# Patient Record
Sex: Female | Born: 1955
Health system: Southern US, Community
[De-identification: ages and names within clinical notes are randomized; demographics above are authoritative.]

## PROBLEM LIST (undated history)

## (undated) DIAGNOSIS — F419 Anxiety disorder, unspecified: Secondary | ICD-10-CM

## (undated) DIAGNOSIS — M1990 Unspecified osteoarthritis, unspecified site: Secondary | ICD-10-CM

## (undated) DIAGNOSIS — M21371 Foot drop, right foot: Secondary | ICD-10-CM

## (undated) DIAGNOSIS — K5909 Other constipation: Secondary | ICD-10-CM

## (undated) DIAGNOSIS — E785 Hyperlipidemia, unspecified: Secondary | ICD-10-CM

## (undated) DIAGNOSIS — R3911 Hesitancy of micturition: Secondary | ICD-10-CM

## (undated) DIAGNOSIS — E119 Type 2 diabetes mellitus without complications: Secondary | ICD-10-CM

## (undated) DIAGNOSIS — K589 Irritable bowel syndrome without diarrhea: Secondary | ICD-10-CM

## (undated) DIAGNOSIS — G4489 Other headache syndrome: Secondary | ICD-10-CM

## (undated) DIAGNOSIS — Z8711 Personal history of peptic ulcer disease: Secondary | ICD-10-CM

## (undated) DIAGNOSIS — Z9889 Other specified postprocedural states: Secondary | ICD-10-CM

## (undated) DIAGNOSIS — R3989 Other symptoms and signs involving the genitourinary system: Secondary | ICD-10-CM

## (undated) DIAGNOSIS — Z8709 Personal history of other diseases of the respiratory system: Secondary | ICD-10-CM

## (undated) DIAGNOSIS — J302 Other seasonal allergic rhinitis: Secondary | ICD-10-CM

## (undated) DIAGNOSIS — R7303 Prediabetes: Secondary | ICD-10-CM

## (undated) DIAGNOSIS — J45901 Unspecified asthma with (acute) exacerbation: Secondary | ICD-10-CM

## (undated) DIAGNOSIS — Z87440 Personal history of urinary (tract) infections: Secondary | ICD-10-CM

## (undated) DIAGNOSIS — I671 Cerebral aneurysm, nonruptured: Secondary | ICD-10-CM

## (undated) DIAGNOSIS — Z8679 Personal history of other diseases of the circulatory system: Secondary | ICD-10-CM

## (undated) DIAGNOSIS — N3010 Interstitial cystitis (chronic) without hematuria: Secondary | ICD-10-CM

## (undated) DIAGNOSIS — Z973 Presence of spectacles and contact lenses: Secondary | ICD-10-CM

## (undated) DIAGNOSIS — R339 Retention of urine, unspecified: Secondary | ICD-10-CM

## (undated) DIAGNOSIS — Z8719 Personal history of other diseases of the digestive system: Secondary | ICD-10-CM

## (undated) DIAGNOSIS — K219 Gastro-esophageal reflux disease without esophagitis: Secondary | ICD-10-CM

## (undated) DIAGNOSIS — Z972 Presence of dental prosthetic device (complete) (partial): Secondary | ICD-10-CM

## (undated) HISTORY — PX: TONSILLECTOMY: SUR1361

## (undated) HISTORY — PX: TOTAL HIP ARTHROPLASTY: SHX124

## (undated) HISTORY — PX: OTHER SURGICAL HISTORY: SHX169

## (undated) HISTORY — PX: ANEURYSM COILING: SHX5349

---

## 1995-07-02 HISTORY — PX: ABDOMINAL HYSTERECTOMY: SHX81

## 1997-12-09 ENCOUNTER — Inpatient Hospital Stay (HOSPITAL_COMMUNITY): Admission: RE | Admit: 1997-12-09 | Discharge: 1997-12-11 | Payer: Self-pay | Admitting: *Deleted

## 1997-12-19 ENCOUNTER — Ambulatory Visit (HOSPITAL_COMMUNITY): Admission: RE | Admit: 1997-12-19 | Discharge: 1997-12-19 | Payer: Self-pay | Admitting: *Deleted

## 1998-06-07 ENCOUNTER — Emergency Department (HOSPITAL_COMMUNITY): Admission: EM | Admit: 1998-06-07 | Discharge: 1998-06-07 | Payer: Self-pay | Admitting: Emergency Medicine

## 1998-07-01 HISTORY — PX: OTHER SURGICAL HISTORY: SHX169

## 1998-08-08 ENCOUNTER — Emergency Department (HOSPITAL_COMMUNITY): Admission: EM | Admit: 1998-08-08 | Discharge: 1998-08-08 | Payer: Self-pay

## 1998-08-17 ENCOUNTER — Emergency Department (HOSPITAL_COMMUNITY): Admission: EM | Admit: 1998-08-17 | Discharge: 1998-08-17 | Payer: Self-pay | Admitting: Internal Medicine

## 1998-08-23 ENCOUNTER — Emergency Department (HOSPITAL_COMMUNITY): Admission: EM | Admit: 1998-08-23 | Discharge: 1998-08-23 | Payer: Self-pay | Admitting: Emergency Medicine

## 1999-11-08 ENCOUNTER — Ambulatory Visit (HOSPITAL_COMMUNITY): Admission: RE | Admit: 1999-11-08 | Discharge: 1999-11-08 | Payer: Self-pay | Admitting: Neurosurgery

## 1999-11-08 ENCOUNTER — Encounter: Payer: Self-pay | Admitting: Neurosurgery

## 1999-11-22 ENCOUNTER — Ambulatory Visit (HOSPITAL_COMMUNITY): Admission: RE | Admit: 1999-11-22 | Discharge: 1999-11-22 | Payer: Self-pay | Admitting: Neurosurgery

## 1999-11-22 ENCOUNTER — Encounter: Payer: Self-pay | Admitting: Neurosurgery

## 1999-12-06 ENCOUNTER — Ambulatory Visit (HOSPITAL_COMMUNITY): Admission: RE | Admit: 1999-12-06 | Discharge: 1999-12-06 | Payer: Self-pay | Admitting: Neurosurgery

## 1999-12-06 ENCOUNTER — Encounter: Payer: Self-pay | Admitting: Neurosurgery

## 2000-07-01 HISTORY — PX: ANTERIOR CERVICAL DECOMP/DISCECTOMY FUSION: SHX1161

## 2000-11-27 ENCOUNTER — Encounter: Payer: Self-pay | Admitting: Neurosurgery

## 2000-11-27 ENCOUNTER — Inpatient Hospital Stay (HOSPITAL_COMMUNITY): Admission: RE | Admit: 2000-11-27 | Discharge: 2000-11-28 | Payer: Self-pay | Admitting: Neurosurgery

## 2001-01-12 ENCOUNTER — Encounter: Payer: Self-pay | Admitting: Neurosurgery

## 2001-01-12 ENCOUNTER — Encounter: Admission: RE | Admit: 2001-01-12 | Discharge: 2001-01-12 | Payer: Self-pay | Admitting: Neurosurgery

## 2001-02-10 ENCOUNTER — Encounter: Admission: RE | Admit: 2001-02-10 | Discharge: 2001-05-11 | Payer: Self-pay | Admitting: Neurosurgery

## 2001-02-17 ENCOUNTER — Encounter: Admission: RE | Admit: 2001-02-17 | Discharge: 2001-02-17 | Payer: Self-pay | Admitting: Neurosurgery

## 2001-02-17 ENCOUNTER — Encounter: Payer: Self-pay | Admitting: Neurosurgery

## 2001-03-01 ENCOUNTER — Emergency Department (HOSPITAL_COMMUNITY): Admission: EM | Admit: 2001-03-01 | Discharge: 2001-03-01 | Payer: Self-pay | Admitting: Emergency Medicine

## 2001-03-01 ENCOUNTER — Encounter: Payer: Self-pay | Admitting: Emergency Medicine

## 2001-03-03 ENCOUNTER — Emergency Department (HOSPITAL_COMMUNITY): Admission: EM | Admit: 2001-03-03 | Discharge: 2001-03-03 | Payer: Self-pay | Admitting: Emergency Medicine

## 2001-05-15 ENCOUNTER — Emergency Department (HOSPITAL_COMMUNITY): Admission: EM | Admit: 2001-05-15 | Discharge: 2001-05-15 | Payer: Self-pay | Admitting: *Deleted

## 2001-07-08 ENCOUNTER — Encounter: Payer: Self-pay | Admitting: Unknown Physician Specialty

## 2001-07-08 ENCOUNTER — Ambulatory Visit (HOSPITAL_COMMUNITY): Admission: RE | Admit: 2001-07-08 | Discharge: 2001-07-08 | Payer: Self-pay | Admitting: Unknown Physician Specialty

## 2001-08-03 ENCOUNTER — Encounter: Payer: Self-pay | Admitting: Emergency Medicine

## 2001-08-03 ENCOUNTER — Emergency Department (HOSPITAL_COMMUNITY): Admission: EM | Admit: 2001-08-03 | Discharge: 2001-08-03 | Payer: Self-pay | Admitting: Emergency Medicine

## 2002-06-25 ENCOUNTER — Encounter (HOSPITAL_COMMUNITY): Admission: RE | Admit: 2002-06-25 | Discharge: 2002-07-25 | Payer: Self-pay | Admitting: Preventative Medicine

## 2002-07-01 HISTORY — PX: POSTERIOR FUSION CERVICAL SPINE: SUR628

## 2002-07-07 ENCOUNTER — Ambulatory Visit (HOSPITAL_COMMUNITY): Admission: RE | Admit: 2002-07-07 | Discharge: 2002-07-07 | Payer: Self-pay | Admitting: Preventative Medicine

## 2002-07-07 ENCOUNTER — Encounter: Payer: Self-pay | Admitting: Preventative Medicine

## 2002-08-17 ENCOUNTER — Emergency Department (HOSPITAL_COMMUNITY): Admission: EM | Admit: 2002-08-17 | Discharge: 2002-08-17 | Payer: Self-pay | Admitting: *Deleted

## 2002-11-02 ENCOUNTER — Emergency Department (HOSPITAL_COMMUNITY): Admission: EM | Admit: 2002-11-02 | Discharge: 2002-11-02 | Payer: Self-pay | Admitting: Emergency Medicine

## 2002-11-02 ENCOUNTER — Ambulatory Visit (HOSPITAL_COMMUNITY): Admission: RE | Admit: 2002-11-02 | Discharge: 2002-11-02 | Payer: Self-pay | Admitting: Neurosurgery

## 2002-11-02 ENCOUNTER — Encounter: Payer: Self-pay | Admitting: Neurosurgery

## 2003-01-12 ENCOUNTER — Encounter: Payer: Self-pay | Admitting: Family Medicine

## 2003-01-12 ENCOUNTER — Ambulatory Visit (HOSPITAL_COMMUNITY): Admission: RE | Admit: 2003-01-12 | Discharge: 2003-01-12 | Payer: Self-pay | Admitting: Family Medicine

## 2003-02-09 ENCOUNTER — Encounter: Payer: Self-pay | Admitting: Neurosurgery

## 2003-02-09 ENCOUNTER — Encounter (HOSPITAL_COMMUNITY): Admission: RE | Admit: 2003-02-09 | Discharge: 2003-03-11 | Payer: Self-pay | Admitting: Neurosurgery

## 2003-07-27 ENCOUNTER — Ambulatory Visit (HOSPITAL_COMMUNITY): Admission: RE | Admit: 2003-07-27 | Discharge: 2003-07-27 | Payer: Self-pay | Admitting: Internal Medicine

## 2004-03-13 ENCOUNTER — Ambulatory Visit: Payer: Self-pay | Admitting: Family Medicine

## 2004-04-16 ENCOUNTER — Ambulatory Visit: Payer: Self-pay | Admitting: Family Medicine

## 2004-04-17 ENCOUNTER — Ambulatory Visit: Payer: Self-pay | Admitting: *Deleted

## 2004-04-17 ENCOUNTER — Ambulatory Visit: Payer: Self-pay | Admitting: Family Medicine

## 2004-04-30 ENCOUNTER — Ambulatory Visit: Payer: Self-pay | Admitting: Family Medicine

## 2004-06-01 ENCOUNTER — Ambulatory Visit (HOSPITAL_COMMUNITY): Admission: RE | Admit: 2004-06-01 | Discharge: 2004-06-01 | Payer: Self-pay | Admitting: Internal Medicine

## 2004-07-26 ENCOUNTER — Ambulatory Visit: Payer: Self-pay | Admitting: Family Medicine

## 2004-08-01 ENCOUNTER — Ambulatory Visit: Payer: Self-pay | Admitting: Family Medicine

## 2004-08-29 ENCOUNTER — Ambulatory Visit: Payer: Self-pay | Admitting: Family Medicine

## 2004-08-30 ENCOUNTER — Ambulatory Visit: Payer: Self-pay | Admitting: Family Medicine

## 2004-09-18 ENCOUNTER — Ambulatory Visit: Payer: Self-pay | Admitting: Family Medicine

## 2004-10-01 ENCOUNTER — Ambulatory Visit: Payer: Self-pay | Admitting: Internal Medicine

## 2004-10-01 ENCOUNTER — Ambulatory Visit: Payer: Self-pay | Admitting: Family Medicine

## 2004-10-02 ENCOUNTER — Ambulatory Visit: Payer: Self-pay | Admitting: Family Medicine

## 2004-10-17 ENCOUNTER — Emergency Department (HOSPITAL_COMMUNITY): Admission: EM | Admit: 2004-10-17 | Discharge: 2004-10-17 | Payer: Self-pay | Admitting: Emergency Medicine

## 2004-12-05 ENCOUNTER — Ambulatory Visit: Payer: Self-pay | Admitting: Family Medicine

## 2004-12-06 ENCOUNTER — Ambulatory Visit: Payer: Self-pay | Admitting: *Deleted

## 2004-12-12 ENCOUNTER — Ambulatory Visit (HOSPITAL_COMMUNITY): Admission: RE | Admit: 2004-12-12 | Discharge: 2004-12-12 | Payer: Self-pay | Admitting: Family Medicine

## 2004-12-31 ENCOUNTER — Ambulatory Visit: Payer: Self-pay | Admitting: Internal Medicine

## 2005-02-11 ENCOUNTER — Ambulatory Visit: Payer: Self-pay | Admitting: Family Medicine

## 2005-04-01 ENCOUNTER — Encounter (INDEPENDENT_AMBULATORY_CARE_PROVIDER_SITE_OTHER): Payer: Self-pay | Admitting: Family Medicine

## 2005-04-18 ENCOUNTER — Ambulatory Visit: Payer: Self-pay | Admitting: Family Medicine

## 2005-04-29 ENCOUNTER — Ambulatory Visit (HOSPITAL_COMMUNITY): Admission: RE | Admit: 2005-04-29 | Discharge: 2005-04-29 | Payer: Self-pay | Admitting: Internal Medicine

## 2005-06-12 ENCOUNTER — Ambulatory Visit: Payer: Self-pay | Admitting: Family Medicine

## 2005-07-30 ENCOUNTER — Ambulatory Visit: Payer: Self-pay | Admitting: Internal Medicine

## 2005-09-13 ENCOUNTER — Ambulatory Visit: Payer: Self-pay | Admitting: Internal Medicine

## 2005-09-19 ENCOUNTER — Ambulatory Visit (HOSPITAL_COMMUNITY): Admission: RE | Admit: 2005-09-19 | Discharge: 2005-09-19 | Payer: Self-pay | Admitting: Internal Medicine

## 2005-10-09 ENCOUNTER — Ambulatory Visit: Payer: Self-pay | Admitting: Family Medicine

## 2005-11-06 ENCOUNTER — Ambulatory Visit: Payer: Self-pay | Admitting: Family Medicine

## 2005-12-13 ENCOUNTER — Ambulatory Visit: Payer: Self-pay | Admitting: *Deleted

## 2005-12-17 ENCOUNTER — Ambulatory Visit: Payer: Self-pay | Admitting: Family Medicine

## 2005-12-25 ENCOUNTER — Ambulatory Visit (HOSPITAL_COMMUNITY): Admission: RE | Admit: 2005-12-25 | Discharge: 2005-12-25 | Payer: Self-pay | Admitting: Family Medicine

## 2006-01-16 ENCOUNTER — Ambulatory Visit: Payer: Self-pay | Admitting: Family Medicine

## 2006-01-16 ENCOUNTER — Ambulatory Visit (HOSPITAL_COMMUNITY): Admission: RE | Admit: 2006-01-16 | Discharge: 2006-01-16 | Payer: Self-pay | Admitting: Family Medicine

## 2006-02-26 ENCOUNTER — Ambulatory Visit: Payer: Self-pay | Admitting: Family Medicine

## 2006-03-10 ENCOUNTER — Emergency Department (HOSPITAL_COMMUNITY): Admission: EM | Admit: 2006-03-10 | Discharge: 2006-03-10 | Payer: Self-pay | Admitting: Family Medicine

## 2006-03-14 ENCOUNTER — Ambulatory Visit: Payer: Self-pay | Admitting: Family Medicine

## 2006-04-16 ENCOUNTER — Emergency Department (HOSPITAL_COMMUNITY): Admission: EM | Admit: 2006-04-16 | Discharge: 2006-04-16 | Payer: Self-pay | Admitting: Family Medicine

## 2006-04-29 ENCOUNTER — Ambulatory Visit: Payer: Self-pay | Admitting: Family Medicine

## 2006-06-04 ENCOUNTER — Ambulatory Visit (HOSPITAL_COMMUNITY): Admission: RE | Admit: 2006-06-04 | Discharge: 2006-06-04 | Payer: Self-pay | Admitting: Urology

## 2006-06-06 ENCOUNTER — Ambulatory Visit: Payer: Self-pay | Admitting: Family Medicine

## 2006-07-15 ENCOUNTER — Ambulatory Visit (HOSPITAL_BASED_OUTPATIENT_CLINIC_OR_DEPARTMENT_OTHER): Admission: RE | Admit: 2006-07-15 | Discharge: 2006-07-15 | Payer: Self-pay | Admitting: Urology

## 2006-07-15 HISTORY — PX: OTHER SURGICAL HISTORY: SHX169

## 2006-08-19 ENCOUNTER — Ambulatory Visit: Payer: Self-pay | Admitting: Family Medicine

## 2006-08-27 ENCOUNTER — Ambulatory Visit: Payer: Self-pay | Admitting: Family Medicine

## 2006-09-01 ENCOUNTER — Ambulatory Visit (HOSPITAL_COMMUNITY): Admission: RE | Admit: 2006-09-01 | Discharge: 2006-09-01 | Payer: Self-pay | Admitting: Internal Medicine

## 2006-09-10 ENCOUNTER — Ambulatory Visit: Payer: Self-pay | Admitting: Family Medicine

## 2006-10-07 ENCOUNTER — Emergency Department (HOSPITAL_COMMUNITY): Admission: EM | Admit: 2006-10-07 | Discharge: 2006-10-07 | Payer: Self-pay | Admitting: Family Medicine

## 2006-10-27 ENCOUNTER — Ambulatory Visit: Payer: Self-pay | Admitting: Family Medicine

## 2006-12-15 ENCOUNTER — Encounter (INDEPENDENT_AMBULATORY_CARE_PROVIDER_SITE_OTHER): Payer: Self-pay | Admitting: Family Medicine

## 2006-12-15 DIAGNOSIS — N6029 Fibroadenosis of unspecified breast: Secondary | ICD-10-CM | POA: Insufficient documentation

## 2006-12-15 DIAGNOSIS — N3010 Interstitial cystitis (chronic) without hematuria: Secondary | ICD-10-CM | POA: Insufficient documentation

## 2006-12-15 DIAGNOSIS — N951 Menopausal and female climacteric states: Secondary | ICD-10-CM | POA: Insufficient documentation

## 2006-12-15 DIAGNOSIS — M545 Low back pain, unspecified: Secondary | ICD-10-CM | POA: Insufficient documentation

## 2006-12-15 DIAGNOSIS — N809 Endometriosis, unspecified: Secondary | ICD-10-CM | POA: Insufficient documentation

## 2006-12-15 DIAGNOSIS — J309 Allergic rhinitis, unspecified: Secondary | ICD-10-CM | POA: Insufficient documentation

## 2006-12-15 DIAGNOSIS — Z8719 Personal history of other diseases of the digestive system: Secondary | ICD-10-CM | POA: Insufficient documentation

## 2006-12-15 DIAGNOSIS — K219 Gastro-esophageal reflux disease without esophagitis: Secondary | ICD-10-CM | POA: Insufficient documentation

## 2006-12-15 DIAGNOSIS — Z9889 Other specified postprocedural states: Secondary | ICD-10-CM | POA: Insufficient documentation

## 2006-12-15 DIAGNOSIS — Z9079 Acquired absence of other genital organ(s): Secondary | ICD-10-CM | POA: Insufficient documentation

## 2006-12-15 DIAGNOSIS — F1920 Other psychoactive substance dependence, uncomplicated: Secondary | ICD-10-CM | POA: Insufficient documentation

## 2006-12-15 DIAGNOSIS — J45909 Unspecified asthma, uncomplicated: Secondary | ICD-10-CM | POA: Insufficient documentation

## 2006-12-15 DIAGNOSIS — E785 Hyperlipidemia, unspecified: Secondary | ICD-10-CM | POA: Insufficient documentation

## 2006-12-29 ENCOUNTER — Ambulatory Visit (HOSPITAL_COMMUNITY): Admission: RE | Admit: 2006-12-29 | Discharge: 2006-12-29 | Payer: Self-pay | Admitting: Family Medicine

## 2007-01-27 ENCOUNTER — Ambulatory Visit (HOSPITAL_COMMUNITY): Admission: RE | Admit: 2007-01-27 | Discharge: 2007-01-27 | Payer: Self-pay | Admitting: Anesthesiology

## 2007-01-29 ENCOUNTER — Ambulatory Visit (HOSPITAL_COMMUNITY): Admission: RE | Admit: 2007-01-29 | Discharge: 2007-01-29 | Payer: Self-pay | Admitting: Anesthesiology

## 2007-03-12 ENCOUNTER — Ambulatory Visit: Payer: Self-pay | Admitting: Internal Medicine

## 2007-03-12 LAB — CONVERTED CEMR LAB
ALT: 12 units/L (ref 0–35)
Basophils Absolute: 0.1 10*3/uL (ref 0.0–0.1)
Basophils Relative: 1 % (ref 0–1)
CO2: 26 meq/L (ref 19–32)
Creatinine, Ser: 0.71 mg/dL (ref 0.40–1.20)
Eosinophils Relative: 2 % (ref 0–5)
HCT: 38.4 % (ref 36.0–46.0)
Hemoglobin: 12.3 g/dL (ref 12.0–15.0)
Lymphocytes Relative: 63 % — ABNORMAL HIGH (ref 12–46)
MCHC: 32 g/dL (ref 30.0–36.0)
Monocytes Absolute: 0.4 10*3/uL (ref 0.2–0.7)
RDW: 14.8 % — ABNORMAL HIGH (ref 11.5–14.0)
Total Bilirubin: 0.2 mg/dL — ABNORMAL LOW (ref 0.3–1.2)

## 2007-03-18 ENCOUNTER — Encounter (INDEPENDENT_AMBULATORY_CARE_PROVIDER_SITE_OTHER): Payer: Self-pay | Admitting: *Deleted

## 2007-04-08 ENCOUNTER — Ambulatory Visit: Payer: Self-pay | Admitting: Internal Medicine

## 2007-05-31 ENCOUNTER — Emergency Department (HOSPITAL_COMMUNITY): Admission: EM | Admit: 2007-05-31 | Discharge: 2007-05-31 | Payer: Self-pay | Admitting: Emergency Medicine

## 2007-06-09 ENCOUNTER — Ambulatory Visit: Payer: Self-pay | Admitting: Family Medicine

## 2007-06-29 ENCOUNTER — Ambulatory Visit: Payer: Self-pay | Admitting: Family Medicine

## 2007-07-11 ENCOUNTER — Emergency Department (HOSPITAL_COMMUNITY): Admission: EM | Admit: 2007-07-11 | Discharge: 2007-07-11 | Payer: Self-pay | Admitting: Family Medicine

## 2007-08-09 ENCOUNTER — Emergency Department (HOSPITAL_COMMUNITY): Admission: EM | Admit: 2007-08-09 | Discharge: 2007-08-09 | Payer: Self-pay | Admitting: Family Medicine

## 2007-09-28 ENCOUNTER — Ambulatory Visit: Payer: Self-pay | Admitting: Internal Medicine

## 2007-09-28 ENCOUNTER — Encounter (INDEPENDENT_AMBULATORY_CARE_PROVIDER_SITE_OTHER): Payer: Self-pay | Admitting: Family Medicine

## 2007-09-28 LAB — CONVERTED CEMR LAB
Albumin: 4.5 g/dL (ref 3.5–5.2)
CO2: 26 meq/L (ref 19–32)
Calcium: 9.5 mg/dL (ref 8.4–10.5)
Chloride: 100 meq/L (ref 96–112)
Cholesterol: 355 mg/dL — ABNORMAL HIGH (ref 0–200)
Glucose, Bld: 87 mg/dL (ref 70–99)
Potassium: 4.8 meq/L (ref 3.5–5.3)
Sodium: 141 meq/L (ref 135–145)
Total Bilirubin: 0.3 mg/dL (ref 0.3–1.2)
Total Protein: 7.8 g/dL (ref 6.0–8.3)
Triglycerides: 238 mg/dL — ABNORMAL HIGH (ref <150)

## 2007-10-05 ENCOUNTER — Ambulatory Visit: Payer: Self-pay | Admitting: Internal Medicine

## 2007-11-05 ENCOUNTER — Emergency Department (HOSPITAL_COMMUNITY): Admission: EM | Admit: 2007-11-05 | Discharge: 2007-11-05 | Payer: Self-pay | Admitting: Family Medicine

## 2007-11-13 ENCOUNTER — Emergency Department (HOSPITAL_COMMUNITY): Admission: EM | Admit: 2007-11-13 | Discharge: 2007-11-13 | Payer: Self-pay | Admitting: Family Medicine

## 2007-11-16 ENCOUNTER — Ambulatory Visit: Payer: Self-pay | Admitting: Internal Medicine

## 2007-11-17 ENCOUNTER — Encounter: Payer: Self-pay | Admitting: Family Medicine

## 2007-11-17 LAB — CONVERTED CEMR LAB
Albumin: 4.2 g/dL (ref 3.5–5.2)
Alkaline Phosphatase: 62 units/L (ref 39–117)
BUN: 7 mg/dL (ref 6–23)
Basophils Relative: 0 % (ref 0–1)
CO2: 28 meq/L (ref 19–32)
Cortisol, Plasma: 0.6 ug/dL
Eosinophils Absolute: 0.1 10*3/uL (ref 0.0–0.7)
Glucose, Bld: 90 mg/dL (ref 70–99)
HCT: 42.7 % (ref 36.0–46.0)
Hemoglobin: 12.9 g/dL (ref 12.0–15.0)
Lymphs Abs: 4.6 10*3/uL — ABNORMAL HIGH (ref 0.7–4.0)
MCHC: 30.2 g/dL (ref 30.0–36.0)
MCV: 97.7 fL (ref 78.0–100.0)
Monocytes Absolute: 0.6 10*3/uL (ref 0.1–1.0)
Monocytes Relative: 6 % (ref 3–12)
RBC: 4.37 M/uL (ref 3.87–5.11)
Total Bilirubin: 0.2 mg/dL — ABNORMAL LOW (ref 0.3–1.2)

## 2007-11-28 ENCOUNTER — Emergency Department (HOSPITAL_COMMUNITY): Admission: EM | Admit: 2007-11-28 | Discharge: 2007-11-28 | Payer: Self-pay | Admitting: Emergency Medicine

## 2007-12-21 ENCOUNTER — Ambulatory Visit: Payer: Self-pay | Admitting: Internal Medicine

## 2007-12-21 ENCOUNTER — Encounter: Payer: Self-pay | Admitting: Family Medicine

## 2007-12-21 LAB — CONVERTED CEMR LAB
HCV Ab: NEGATIVE
Hep B Core Total Ab: NEGATIVE
Hep B E Ab: NEGATIVE
Hep B S Ab: POSITIVE — AB

## 2007-12-31 ENCOUNTER — Ambulatory Visit (HOSPITAL_COMMUNITY): Admission: RE | Admit: 2007-12-31 | Discharge: 2007-12-31 | Payer: Self-pay | Admitting: Family Medicine

## 2008-03-06 ENCOUNTER — Emergency Department (HOSPITAL_COMMUNITY): Admission: EM | Admit: 2008-03-06 | Discharge: 2008-03-06 | Payer: Self-pay | Admitting: Emergency Medicine

## 2008-03-07 ENCOUNTER — Emergency Department (HOSPITAL_COMMUNITY): Admission: EM | Admit: 2008-03-07 | Discharge: 2008-03-07 | Payer: Self-pay | Admitting: Family Medicine

## 2008-03-30 ENCOUNTER — Ambulatory Visit: Payer: Self-pay | Admitting: Internal Medicine

## 2008-04-14 ENCOUNTER — Encounter (INDEPENDENT_AMBULATORY_CARE_PROVIDER_SITE_OTHER): Payer: Self-pay | Admitting: Adult Health

## 2008-04-14 ENCOUNTER — Ambulatory Visit: Payer: Self-pay | Admitting: Internal Medicine

## 2008-04-14 LAB — CONVERTED CEMR LAB
Albumin: 4.6 g/dL (ref 3.5–5.2)
BUN: 6 mg/dL (ref 6–23)
Basophils Relative: 1 % (ref 0–1)
CO2: 27 meq/L (ref 19–32)
Calcium: 9.1 mg/dL (ref 8.4–10.5)
Chloride: 105 meq/L (ref 96–112)
Cholesterol: 267 mg/dL — ABNORMAL HIGH (ref 0–200)
Creatinine, Ser: 0.68 mg/dL (ref 0.40–1.20)
Glucose, Bld: 83 mg/dL (ref 70–99)
HDL: 51 mg/dL (ref >39)
Hemoglobin: 12.7 g/dL (ref 12.0–15.0)
LH: 68 milliintl units/mL
Lymphs Abs: 4.4 10*3/uL — ABNORMAL HIGH (ref 0.7–4.0)
MCHC: 31.1 g/dL (ref 30.0–36.0)
Monocytes Absolute: 0.5 10*3/uL (ref 0.1–1.0)
Monocytes Relative: 7 % (ref 3–12)
Neutro Abs: 2.1 10*3/uL (ref 1.7–7.7)
Neutrophils Relative %: 30 % — ABNORMAL LOW (ref 43–77)
Potassium: 4 meq/L (ref 3.5–5.3)
RBC: 4.47 M/uL (ref 3.87–5.11)
T4, Total: 8.2 ug/dL (ref 5.0–12.5)
Total CHOL/HDL Ratio: 5.2
Triglycerides: 214 mg/dL — ABNORMAL HIGH (ref <150)
WBC: 7.1 10*3/uL (ref 4.0–10.5)

## 2008-05-09 ENCOUNTER — Ambulatory Visit: Payer: Self-pay | Admitting: Family Medicine

## 2008-05-09 ENCOUNTER — Encounter (INDEPENDENT_AMBULATORY_CARE_PROVIDER_SITE_OTHER): Payer: Self-pay | Admitting: Adult Health

## 2008-05-09 LAB — CONVERTED CEMR LAB: Sed Rate: 9 mm/hr (ref 0–22)

## 2008-05-19 ENCOUNTER — Ambulatory Visit (HOSPITAL_COMMUNITY): Admission: RE | Admit: 2008-05-19 | Discharge: 2008-05-19 | Payer: Self-pay | Admitting: Internal Medicine

## 2008-05-21 ENCOUNTER — Emergency Department (HOSPITAL_COMMUNITY): Admission: EM | Admit: 2008-05-21 | Discharge: 2008-05-21 | Payer: Self-pay | Admitting: Emergency Medicine

## 2008-05-25 ENCOUNTER — Other Ambulatory Visit: Admission: RE | Admit: 2008-05-25 | Discharge: 2008-05-25 | Payer: Self-pay | Admitting: Family Medicine

## 2008-05-25 ENCOUNTER — Encounter (INDEPENDENT_AMBULATORY_CARE_PROVIDER_SITE_OTHER): Payer: Self-pay | Admitting: Family Medicine

## 2008-06-27 ENCOUNTER — Encounter (INDEPENDENT_AMBULATORY_CARE_PROVIDER_SITE_OTHER): Payer: Self-pay | Admitting: Adult Health

## 2008-06-27 ENCOUNTER — Ambulatory Visit: Payer: Self-pay | Admitting: Internal Medicine

## 2008-06-27 ENCOUNTER — Other Ambulatory Visit: Admission: RE | Admit: 2008-06-27 | Discharge: 2008-06-27 | Payer: Self-pay | Admitting: Family Medicine

## 2008-06-27 LAB — CONVERTED CEMR LAB
AST: 16 units/L (ref 0–37)
Albumin: 4.6 g/dL (ref 3.5–5.2)
Alkaline Phosphatase: 58 units/L (ref 39–117)
BUN: 8 mg/dL (ref 6–23)
Calcium: 9 mg/dL (ref 8.4–10.5)
Chlamydia, DNA Probe: NEGATIVE
Creatinine, Ser: 0.7 mg/dL (ref 0.40–1.20)
Glucose, Bld: 64 mg/dL — ABNORMAL LOW (ref 70–99)
HDL: 48 mg/dL (ref >39)
LDL Cholesterol: 50 mg/dL (ref 0–99)
Potassium: 4.1 meq/L (ref 3.5–5.3)
Total CHOL/HDL Ratio: 2.9
Triglycerides: 202 mg/dL — ABNORMAL HIGH (ref <150)

## 2008-07-21 ENCOUNTER — Encounter: Admission: RE | Admit: 2008-07-21 | Discharge: 2008-07-21 | Payer: Self-pay | Admitting: Family Medicine

## 2008-08-25 ENCOUNTER — Encounter: Admission: RE | Admit: 2008-08-25 | Discharge: 2008-08-25 | Payer: Self-pay | Admitting: Family Medicine

## 2008-09-05 ENCOUNTER — Ambulatory Visit (HOSPITAL_COMMUNITY): Admission: RE | Admit: 2008-09-05 | Discharge: 2008-09-05 | Payer: Self-pay | Admitting: Family Medicine

## 2009-02-17 ENCOUNTER — Encounter: Admission: RE | Admit: 2009-02-17 | Discharge: 2009-02-17 | Payer: Self-pay | Admitting: Family Medicine

## 2009-05-04 ENCOUNTER — Encounter: Admission: RE | Admit: 2009-05-04 | Discharge: 2009-05-04 | Payer: Self-pay | Admitting: Family Medicine

## 2009-05-29 ENCOUNTER — Encounter: Admission: RE | Admit: 2009-05-29 | Discharge: 2009-05-29 | Payer: Self-pay | Admitting: Family Medicine

## 2010-05-17 HISTORY — PX: POSTERIOR LAMINECTOMY / DECOMPRESSION LUMBAR SPINE: SUR740

## 2010-05-18 ENCOUNTER — Inpatient Hospital Stay (HOSPITAL_COMMUNITY): Admission: RE | Admit: 2010-05-18 | Discharge: 2010-05-25 | Payer: Self-pay | Admitting: Orthopedic Surgery

## 2010-06-05 ENCOUNTER — Ambulatory Visit (HOSPITAL_COMMUNITY)
Admission: RE | Admit: 2010-06-05 | Discharge: 2010-06-05 | Payer: Self-pay | Source: Home / Self Care | Admitting: Orthopedic Surgery

## 2010-06-05 ENCOUNTER — Ambulatory Visit
Admission: RE | Admit: 2010-06-05 | Discharge: 2010-06-05 | Payer: Self-pay | Source: Home / Self Care | Admitting: Orthopedic Surgery

## 2010-06-05 ENCOUNTER — Encounter (INDEPENDENT_AMBULATORY_CARE_PROVIDER_SITE_OTHER): Payer: Self-pay | Admitting: Orthopedic Surgery

## 2010-06-13 ENCOUNTER — Emergency Department (HOSPITAL_COMMUNITY)
Admission: EM | Admit: 2010-06-13 | Discharge: 2010-06-13 | Payer: Self-pay | Source: Home / Self Care | Admitting: Family Medicine

## 2010-07-22 ENCOUNTER — Encounter: Payer: Self-pay | Admitting: Family Medicine

## 2010-08-02 ENCOUNTER — Ambulatory Visit: Payer: Self-pay | Admitting: Physical Therapy

## 2010-08-13 ENCOUNTER — Ambulatory Visit: Payer: Medicare Other | Attending: Orthopedic Surgery | Admitting: Physical Therapy

## 2010-08-13 DIAGNOSIS — M2560 Stiffness of unspecified joint, not elsewhere classified: Secondary | ICD-10-CM | POA: Insufficient documentation

## 2010-08-13 DIAGNOSIS — IMO0001 Reserved for inherently not codable concepts without codable children: Secondary | ICD-10-CM | POA: Insufficient documentation

## 2010-08-13 DIAGNOSIS — M6281 Muscle weakness (generalized): Secondary | ICD-10-CM | POA: Insufficient documentation

## 2010-08-13 DIAGNOSIS — M2550 Pain in unspecified joint: Secondary | ICD-10-CM | POA: Insufficient documentation

## 2010-08-13 DIAGNOSIS — R262 Difficulty in walking, not elsewhere classified: Secondary | ICD-10-CM | POA: Insufficient documentation

## 2010-08-15 ENCOUNTER — Ambulatory Visit: Payer: Medicare Other | Admitting: Physical Therapy

## 2010-08-20 ENCOUNTER — Encounter: Payer: Medicaid Other | Admitting: Physical Therapy

## 2010-08-22 ENCOUNTER — Ambulatory Visit: Payer: Medicare Other | Admitting: Physical Therapy

## 2010-08-24 ENCOUNTER — Encounter: Payer: Medicaid Other | Admitting: Physical Therapy

## 2010-08-27 ENCOUNTER — Ambulatory Visit: Payer: Medicare Other | Admitting: Physical Therapy

## 2010-08-27 ENCOUNTER — Encounter: Payer: Medicaid Other | Admitting: Physical Therapy

## 2010-08-30 ENCOUNTER — Ambulatory Visit: Payer: Medicare Other | Attending: Family Medicine | Admitting: Physical Therapy

## 2010-08-30 DIAGNOSIS — M2550 Pain in unspecified joint: Secondary | ICD-10-CM | POA: Insufficient documentation

## 2010-08-30 DIAGNOSIS — M2560 Stiffness of unspecified joint, not elsewhere classified: Secondary | ICD-10-CM | POA: Insufficient documentation

## 2010-08-30 DIAGNOSIS — M6281 Muscle weakness (generalized): Secondary | ICD-10-CM | POA: Insufficient documentation

## 2010-08-30 DIAGNOSIS — R262 Difficulty in walking, not elsewhere classified: Secondary | ICD-10-CM | POA: Insufficient documentation

## 2010-08-30 DIAGNOSIS — IMO0001 Reserved for inherently not codable concepts without codable children: Secondary | ICD-10-CM | POA: Insufficient documentation

## 2010-09-04 ENCOUNTER — Ambulatory Visit: Payer: Medicare Other | Admitting: Physical Therapy

## 2010-09-06 ENCOUNTER — Ambulatory Visit: Payer: Medicare Other | Admitting: Physical Therapy

## 2010-09-10 ENCOUNTER — Ambulatory Visit: Payer: Medicare Other | Admitting: Physical Therapy

## 2010-09-11 LAB — BASIC METABOLIC PANEL
Chloride: 105 mEq/L (ref 96–112)
GFR calc non Af Amer: >60 mL/min (ref >60)
Potassium: 4 mEq/L (ref 3.5–5.1)
Sodium: 143 mEq/L (ref 135–145)

## 2010-09-11 LAB — CBC
HCT: 39.7 % (ref 36.0–46.0)
Hemoglobin: 13.4 g/dL (ref 12.0–15.0)
MCV: 90.9 fL (ref 78.0–100.0)
WBC: 9.3 10*3/uL (ref 4.0–10.5)

## 2010-09-11 LAB — TYPE AND SCREEN: Antibody Screen: NEGATIVE

## 2010-09-13 ENCOUNTER — Ambulatory Visit: Payer: Medicaid Other | Admitting: Physical Therapy

## 2010-09-17 ENCOUNTER — Ambulatory Visit: Payer: Medicare Other | Admitting: Physical Therapy

## 2010-09-20 ENCOUNTER — Ambulatory Visit: Payer: Medicare Other | Admitting: Physical Therapy

## 2010-09-25 ENCOUNTER — Ambulatory Visit: Payer: Medicare Other | Admitting: Physical Therapy

## 2010-09-27 ENCOUNTER — Ambulatory Visit: Payer: Medicare Other | Admitting: Physical Therapy

## 2010-10-02 ENCOUNTER — Ambulatory Visit: Payer: Medicare Other | Attending: Family Medicine | Admitting: Physical Therapy

## 2010-10-02 DIAGNOSIS — IMO0001 Reserved for inherently not codable concepts without codable children: Secondary | ICD-10-CM | POA: Insufficient documentation

## 2010-10-02 DIAGNOSIS — M2560 Stiffness of unspecified joint, not elsewhere classified: Secondary | ICD-10-CM | POA: Insufficient documentation

## 2010-10-02 DIAGNOSIS — M2550 Pain in unspecified joint: Secondary | ICD-10-CM | POA: Insufficient documentation

## 2010-10-02 DIAGNOSIS — R262 Difficulty in walking, not elsewhere classified: Secondary | ICD-10-CM | POA: Insufficient documentation

## 2010-10-02 DIAGNOSIS — M6281 Muscle weakness (generalized): Secondary | ICD-10-CM | POA: Insufficient documentation

## 2010-10-04 ENCOUNTER — Ambulatory Visit: Payer: Medicare Other | Admitting: Physical Therapy

## 2010-10-09 ENCOUNTER — Ambulatory Visit: Payer: Medicare Other | Admitting: Physical Therapy

## 2010-10-11 ENCOUNTER — Ambulatory Visit: Payer: Medicare Other | Admitting: Physical Therapy

## 2010-10-23 ENCOUNTER — Ambulatory Visit: Payer: Medicare Other | Admitting: Physical Therapy

## 2010-10-25 ENCOUNTER — Ambulatory Visit: Payer: Medicare Other | Admitting: Physical Therapy

## 2010-10-30 ENCOUNTER — Ambulatory Visit: Payer: Medicare Other | Admitting: Physical Therapy

## 2010-11-01 ENCOUNTER — Ambulatory Visit: Payer: Medicare Other | Attending: Family Medicine | Admitting: Physical Therapy

## 2010-11-01 DIAGNOSIS — M2560 Stiffness of unspecified joint, not elsewhere classified: Secondary | ICD-10-CM | POA: Insufficient documentation

## 2010-11-01 DIAGNOSIS — M2550 Pain in unspecified joint: Secondary | ICD-10-CM | POA: Insufficient documentation

## 2010-11-01 DIAGNOSIS — R262 Difficulty in walking, not elsewhere classified: Secondary | ICD-10-CM | POA: Insufficient documentation

## 2010-11-01 DIAGNOSIS — IMO0001 Reserved for inherently not codable concepts without codable children: Secondary | ICD-10-CM | POA: Insufficient documentation

## 2010-11-01 DIAGNOSIS — M6281 Muscle weakness (generalized): Secondary | ICD-10-CM | POA: Insufficient documentation

## 2010-11-06 ENCOUNTER — Ambulatory Visit: Payer: Medicare Other | Admitting: Physical Therapy

## 2010-11-08 ENCOUNTER — Ambulatory Visit: Payer: Medicare Other | Admitting: Physical Therapy

## 2010-11-13 ENCOUNTER — Ambulatory Visit: Payer: Medicare Other | Admitting: Physical Therapy

## 2010-11-15 ENCOUNTER — Ambulatory Visit: Payer: Medicare Other | Admitting: Physical Therapy

## 2010-11-16 NOTE — Op Note (Signed)
NAMEAARALYNN, SHEPHEARD              ACCOUNT NO.:  [id number]   MEDICAL RECORD NO.:  [id number]          PATIENT TYPE:  AMB   LOCATION:  [facility]                         FACILITY:  [facility]   PHYSICIAN:  Martina Sinner, MD DATE OF BIRTH:  May 28, 1956   DATE OF PROCEDURE:  07/15/2006  DATE OF DISCHARGE:                               OPERATIVE REPORT   PREOPERATIVE DIAGNOSIS:  Pelvic pain.   POSTOPERATIVE DIAGNOSIS:  Pelvic pain; interstitial cystitis.   SURGERY:  Cystoscopy, bladder installation therapy and hydrodistention.   Ashyla Luth has complicated voiding dysfunction and chronic pelvic  pain.  She underwent cystoscopy, hydrodistention.   She was prepped and draped in usual fashion.  She was given intravenous  ciprofloxacin.  On initial inspection of the bladder, the bladder mucosa  was normal and there was no stitch, foreign body or carcinoma.  She was  hydrodistended to 650 mL.   On re-examination of the bladder, there was diffuse glomerulations  throughout the entire bladder.  She had some bleeding.  I irrigated the  bladder.  I emptied the bladder.   I inserted a latex free catheter and instilled 15 mL of 0.5% Marcaine  plus 400 mg of Pyridium.   The patient was sent to recovery room.  The patient will be treated for  her chronic pain syndrome with her interstitial cystitis treatment.           ______________________________  Martina Sinner, MD  Electronically Signed     SAM/MEDQ  D:  07/15/2006  T:  07/15/2006  Job:  [id number]

## 2010-11-16 NOTE — Discharge Summary (Signed)
[place]  Patient:    VERANIA, SALBERG                 MRN: [id number] Adm. Date:  [id number] Disc. Date: 11/28/00 Attending:  Colon Branch                           Discharge Summary  DISCHARGE DIAGNOSIS:  Herniated nucleus pulposus and spondylosis of C4-C5, C5-C6 and C6-C7.  PROCEDURE:  Anterior cervical diskectomy fusion at C4-C5, C5-C6 and C6-C7 with Allograft and anterior cervical plate.  REASON FOR ADMISSION:  The patient is a 55 year old woman who has had on and off neck pain over the years and some right shoulder pain, but since December, she started having a left-sided neck scapular and arm pain radiating down the C6-C7 distributions.  This has continued and has been progressive despite steroids, antiinflammatories and even epidural injections.  MRI was done showing HNP and spondylosis at levels C4-C5, C5-C6 and C6-C7.  The patient was brought in for surgery.  The patient had some decreased strength in right triceps and decreased sensation in the left C6 and C7 distributions.  HOSPITAL COURSE:  The patient was admitted the day of surgery and underwent procedure named above without complications.  Postop, the patient was transferred to the recovery room and then to the floor.  There she had been up ambulating, eating soft foods and drinking without difficulty.  She has had less left arm pain and no pain radiating down past the shoulders and even had less right arm symptoms.  She is doing well.  Her incision is clean, dry and intact.  She will be discharged home in stable condition.  DISCHARGE MEDICATIONS:  Same as prehospitalization, plus Vicodin ES one to two p.o. q.4-6h. p.r.n., #51 given and Flexeril 10 mg p.o. q.8h. p.r.n. spasm.  FOLLOWUP:  Follow up with Dr. Phoebe Perch in three weeks in his office.  ACTIVITY:  No strenuous activity.  SPECIAL INSTRUCTIONS:  C-collar on at all times.  Keep incision dry x 5 days.  DIET:  As  tolerated. DD:  11/28/00 TD:  11/28/00 Job: [id number]

## 2010-11-20 ENCOUNTER — Ambulatory Visit: Payer: Medicare Other | Admitting: Physical Therapy

## 2010-11-22 ENCOUNTER — Ambulatory Visit: Payer: Medicare Other | Admitting: Physical Therapy

## 2010-12-26 ENCOUNTER — Other Ambulatory Visit (HOSPITAL_COMMUNITY): Payer: Self-pay | Admitting: Clinical Neurophysiology

## 2010-12-26 DIAGNOSIS — R200 Anesthesia of skin: Secondary | ICD-10-CM

## 2010-12-26 DIAGNOSIS — R29898 Other symptoms and signs involving the musculoskeletal system: Secondary | ICD-10-CM

## 2010-12-31 ENCOUNTER — Ambulatory Visit
Admission: RE | Admit: 2010-12-31 | Discharge: 2010-12-31 | Disposition: A | Discharge status: Home / Self Care | Payer: Medicare Other | Source: Ambulatory Visit | Attending: Clinical Neurophysiology | Admitting: Clinical Neurophysiology

## 2010-12-31 DIAGNOSIS — R200 Anesthesia of skin: Secondary | ICD-10-CM

## 2010-12-31 DIAGNOSIS — R29898 Other symptoms and signs involving the musculoskeletal system: Secondary | ICD-10-CM

## 2010-12-31 MED ORDER — GADOBENATE DIMEGLUMINE 529 MG/ML IV SOLN
17.0000 mL | Freq: Once | INTRAVENOUS | Status: AC | PRN
  Administered 2010-12-31: 17 mL via INTRAVENOUS

## 2011-02-27 ENCOUNTER — Other Ambulatory Visit: Payer: Self-pay | Admitting: Family Medicine

## 2011-02-27 DIAGNOSIS — R52 Pain, unspecified: Secondary | ICD-10-CM

## 2011-02-27 DIAGNOSIS — R609 Edema, unspecified: Secondary | ICD-10-CM

## 2011-02-28 ENCOUNTER — Ambulatory Visit
Admission: RE | Admit: 2011-02-28 | Discharge: 2011-02-28 | Disposition: A | Discharge status: Home / Self Care | Payer: Medicare Other | Source: Ambulatory Visit | Attending: Family Medicine | Admitting: Family Medicine

## 2011-02-28 DIAGNOSIS — R52 Pain, unspecified: Secondary | ICD-10-CM

## 2011-02-28 DIAGNOSIS — R609 Edema, unspecified: Secondary | ICD-10-CM

## 2011-03-25 ENCOUNTER — Other Ambulatory Visit: Payer: Self-pay | Admitting: Neurology

## 2011-03-25 DIAGNOSIS — G541 Lumbosacral plexus disorders: Secondary | ICD-10-CM

## 2011-03-25 DIAGNOSIS — G5700 Lesion of sciatic nerve, unspecified lower limb: Secondary | ICD-10-CM

## 2011-03-25 DIAGNOSIS — R269 Unspecified abnormalities of gait and mobility: Secondary | ICD-10-CM

## 2011-03-27 LAB — POCT URINALYSIS DIP (DEVICE)
Bilirubin Urine: NEGATIVE
Glucose, UA: NEGATIVE
Ketones, ur: NEGATIVE
Operator id: 282151
Specific Gravity, Urine: 1.01

## 2011-03-27 LAB — URINE CULTURE: Colony Count: 6000

## 2011-03-28 ENCOUNTER — Ambulatory Visit
Admission: RE | Admit: 2011-03-28 | Discharge: 2011-03-28 | Disposition: A | Discharge status: Home / Self Care | Payer: Medicare Other | Source: Ambulatory Visit | Attending: Neurology | Admitting: Neurology

## 2011-03-28 DIAGNOSIS — G5700 Lesion of sciatic nerve, unspecified lower limb: Secondary | ICD-10-CM

## 2011-03-28 DIAGNOSIS — G541 Lumbosacral plexus disorders: Secondary | ICD-10-CM

## 2011-03-28 DIAGNOSIS — R269 Unspecified abnormalities of gait and mobility: Secondary | ICD-10-CM

## 2011-03-28 MED ORDER — GADOBENATE DIMEGLUMINE 529 MG/ML IV SOLN
15.0000 mL | Freq: Once | INTRAVENOUS | Status: AC | PRN
  Administered 2011-03-28: 15 mL via INTRAVENOUS

## 2011-03-29 DIAGNOSIS — M5410 Radiculopathy, site unspecified: Secondary | ICD-10-CM | POA: Insufficient documentation

## 2011-04-01 ENCOUNTER — Other Ambulatory Visit: Payer: Self-pay | Admitting: Neurology

## 2011-04-01 DIAGNOSIS — R413 Other amnesia: Secondary | ICD-10-CM

## 2011-04-01 DIAGNOSIS — R51 Headache: Secondary | ICD-10-CM

## 2011-04-02 ENCOUNTER — Ambulatory Visit
Admission: RE | Admit: 2011-04-02 | Discharge: 2011-04-02 | Disposition: A | Discharge status: Home / Self Care | Payer: Medicare Other | Source: Ambulatory Visit | Attending: Neurology | Admitting: Neurology

## 2011-04-02 DIAGNOSIS — R413 Other amnesia: Secondary | ICD-10-CM

## 2011-04-02 DIAGNOSIS — R51 Headache: Secondary | ICD-10-CM

## 2011-04-03 LAB — URINALYSIS, ROUTINE W REFLEX MICROSCOPIC
Bilirubin Urine: NEGATIVE
Hgb urine dipstick: NEGATIVE
Ketones, ur: NEGATIVE
Specific Gravity, Urine: 1.018
Urobilinogen, UA: 0.2
pH: 8

## 2011-04-03 LAB — BASIC METABOLIC PANEL
BUN: 6
CO2: 27
Glucose, Bld: 139 — ABNORMAL HIGH
Potassium: 3.7
Sodium: 138

## 2011-04-03 LAB — DIFFERENTIAL
Basophils Absolute: 0.1
Basophils Relative: 1
Eosinophils Absolute: 0.1
Eosinophils Relative: 1
Monocytes Absolute: 0.5

## 2011-04-03 LAB — CBC
HCT: 39.1
Hemoglobin: 13.1
MCHC: 33.6
Platelets: 320
RDW: 14.2

## 2011-04-04 ENCOUNTER — Other Ambulatory Visit (HOSPITAL_COMMUNITY): Payer: Self-pay | Admitting: Neurology

## 2011-04-04 DIAGNOSIS — I729 Aneurysm of unspecified site: Secondary | ICD-10-CM

## 2011-04-05 ENCOUNTER — Ambulatory Visit (HOSPITAL_COMMUNITY)
Admission: RE | Admit: 2011-04-05 | Discharge: 2011-04-05 | Disposition: A | Discharge status: Home / Self Care | Payer: Medicare Other | Source: Ambulatory Visit | Attending: Neurology | Admitting: Neurology

## 2011-04-05 ENCOUNTER — Other Ambulatory Visit (HOSPITAL_COMMUNITY): Payer: Self-pay | Admitting: Interventional Radiology

## 2011-04-05 DIAGNOSIS — I729 Aneurysm of unspecified site: Secondary | ICD-10-CM

## 2011-04-11 ENCOUNTER — Ambulatory Visit (HOSPITAL_COMMUNITY): Payer: Medicare Other

## 2011-04-23 DIAGNOSIS — I671 Cerebral aneurysm, nonruptured: Secondary | ICD-10-CM | POA: Insufficient documentation

## 2011-05-15 ENCOUNTER — Encounter: Payer: Self-pay | Admitting: Emergency Medicine

## 2011-05-15 ENCOUNTER — Inpatient Hospital Stay (HOSPITAL_COMMUNITY)
Admission: EM | Admit: 2011-05-15 | Discharge: 2011-05-20 | DRG: 378 | Disposition: A | Discharge status: Home / Self Care | Payer: Medicare Other | Attending: Internal Medicine | Admitting: Internal Medicine

## 2011-05-15 DIAGNOSIS — Z91040 Latex allergy status: Secondary | ICD-10-CM

## 2011-05-15 DIAGNOSIS — K219 Gastro-esophageal reflux disease without esophagitis: Secondary | ICD-10-CM | POA: Diagnosis present

## 2011-05-15 DIAGNOSIS — D62 Acute posthemorrhagic anemia: Secondary | ICD-10-CM | POA: Diagnosis present

## 2011-05-15 DIAGNOSIS — N3010 Interstitial cystitis (chronic) without hematuria: Secondary | ICD-10-CM | POA: Diagnosis present

## 2011-05-15 DIAGNOSIS — J45909 Unspecified asthma, uncomplicated: Secondary | ICD-10-CM | POA: Diagnosis present

## 2011-05-15 DIAGNOSIS — Z79899 Other long term (current) drug therapy: Secondary | ICD-10-CM

## 2011-05-15 DIAGNOSIS — F17200 Nicotine dependence, unspecified, uncomplicated: Secondary | ICD-10-CM | POA: Diagnosis present

## 2011-05-15 DIAGNOSIS — I9589 Other hypotension: Secondary | ICD-10-CM | POA: Diagnosis present

## 2011-05-15 DIAGNOSIS — I959 Hypotension, unspecified: Secondary | ICD-10-CM | POA: Diagnosis present

## 2011-05-15 DIAGNOSIS — Z7982 Long term (current) use of aspirin: Secondary | ICD-10-CM

## 2011-05-15 DIAGNOSIS — K922 Gastrointestinal hemorrhage, unspecified: Secondary | ICD-10-CM

## 2011-05-15 DIAGNOSIS — K254 Chronic or unspecified gastric ulcer with hemorrhage: Principal | ICD-10-CM | POA: Diagnosis present

## 2011-05-15 DIAGNOSIS — R58 Hemorrhage, not elsewhere classified: Secondary | ICD-10-CM | POA: Diagnosis present

## 2011-05-15 DIAGNOSIS — Z7902 Long term (current) use of antithrombotics/antiplatelets: Secondary | ICD-10-CM

## 2011-05-15 DIAGNOSIS — IMO0002 Reserved for concepts with insufficient information to code with codable children: Secondary | ICD-10-CM

## 2011-05-15 DIAGNOSIS — K5900 Constipation, unspecified: Secondary | ICD-10-CM | POA: Diagnosis present

## 2011-05-15 DIAGNOSIS — M545 Low back pain, unspecified: Secondary | ICD-10-CM | POA: Diagnosis present

## 2011-05-15 DIAGNOSIS — Z8719 Personal history of other diseases of the digestive system: Secondary | ICD-10-CM

## 2011-05-15 DIAGNOSIS — E785 Hyperlipidemia, unspecified: Secondary | ICD-10-CM | POA: Diagnosis present

## 2011-05-15 DIAGNOSIS — G8929 Other chronic pain: Secondary | ICD-10-CM | POA: Diagnosis present

## 2011-05-15 DIAGNOSIS — I10 Essential (primary) hypertension: Secondary | ICD-10-CM | POA: Diagnosis present

## 2011-05-15 DIAGNOSIS — Z95820 Peripheral vascular angioplasty status with implants and grafts: Secondary | ICD-10-CM

## 2011-05-15 DIAGNOSIS — Z720 Tobacco use: Secondary | ICD-10-CM | POA: Diagnosis present

## 2011-05-15 DIAGNOSIS — D72829 Elevated white blood cell count, unspecified: Secondary | ICD-10-CM | POA: Diagnosis present

## 2011-05-15 HISTORY — DX: Hyperlipidemia, unspecified: E78.5

## 2011-05-15 HISTORY — DX: Gastro-esophageal reflux disease without esophagitis: K21.9

## 2011-05-15 LAB — CBC
HCT: 28.2 % — ABNORMAL LOW (ref 36.0–46.0)
Hemoglobin: 9.3 g/dL — ABNORMAL LOW (ref 12.0–15.0)
Hemoglobin: 7.8 g/dL — ABNORMAL LOW (ref 12.0–15.0)
MCH: 29.4 pg (ref 26.0–34.0)
MCHC: 33 g/dL (ref 30.0–36.0)
MCHC: 32.4 g/dL (ref 30.0–36.0)
MCV: 89.2 fL (ref 78.0–100.0)
Platelets: 349 10*3/uL (ref 150–400)
RBC: 3.16 MIL/uL — ABNORMAL LOW (ref 3.87–5.11)
RDW: 14.9 % (ref 11.5–15.5)
WBC: 24.5 10*3/uL — ABNORMAL HIGH (ref 4.0–10.5)

## 2011-05-15 LAB — DIFFERENTIAL
Basophils Absolute: 0 10*3/uL (ref 0.0–0.1)
Eosinophils Absolute: 0 10*3/uL (ref 0.0–0.7)
Lymphocytes Relative: 34 % (ref 12–46)
Monocytes Relative: 8 % (ref 3–12)
Neutro Abs: 14.2 10*3/uL — ABNORMAL HIGH (ref 1.7–7.7)
Neutrophils Relative %: 58 % (ref 43–77)

## 2011-05-15 LAB — ABO/RH: ABO/RH(D): O POS

## 2011-05-15 LAB — COMPREHENSIVE METABOLIC PANEL
AST: 18 U/L (ref 0–37)
CO2: 25 mEq/L (ref 19–32)
Calcium: 9.8 mg/dL (ref 8.4–10.5)
Creatinine, Ser: 0.7 mg/dL (ref 0.50–1.10)
GFR calc Af Amer: >90 mL/min (ref >90)
GFR calc non Af Amer: >90 mL/min (ref >90)

## 2011-05-15 LAB — URINALYSIS, ROUTINE W REFLEX MICROSCOPIC
Bilirubin Urine: NEGATIVE
Glucose, UA: NEGATIVE mg/dL
Hgb urine dipstick: NEGATIVE
Ketones, ur: NEGATIVE mg/dL
Leukocytes, UA: NEGATIVE
Nitrite: NEGATIVE
Protein, ur: NEGATIVE mg/dL
Specific Gravity, Urine: 1.022 (ref 1.005–1.030)
Urobilinogen, UA: 0.2 mg/dL (ref 0.0–1.0)
pH: 6.5 (ref 5.0–8.0)

## 2011-05-15 LAB — LACTIC ACID, PLASMA: Lactic Acid, Venous: 2.3 mmol/L — ABNORMAL HIGH (ref 0.5–2.2)

## 2011-05-15 LAB — PREPARE RBC (CROSSMATCH)

## 2011-05-15 LAB — PLATELET FUNCTION ASSAY
Collagen / ADP: 238 s (ref 0–108)
Collagen / Epinephrine: 282 s (ref 0–197)

## 2011-05-15 LAB — PROTIME-INR
INR: 0.98 (ref 0.00–1.49)
Prothrombin Time: 13.2 seconds (ref 11.6–15.2)

## 2011-05-15 LAB — OCCULT BLOOD, POC DEVICE: Fecal Occult Bld: POSITIVE

## 2011-05-15 MED ORDER — FENTANYL 25 MCG/HR TD PT72: 100.0000 ug | MEDICATED_PATCH | TRANSDERMAL | Status: DC

## 2011-05-15 MED ORDER — SODIUM CHLORIDE 0.9 % IV SOLN
8.0000 mg/h | INTRAVENOUS | Status: DC
  Administered 2011-05-16 – 2011-05-19 (×6): 8 mg/h via INTRAVENOUS
  Filled 2011-05-15 (×16): qty 80

## 2011-05-15 MED ORDER — ONDANSETRON HCL 4 MG PO TABS
4.0000 mg | ORAL_TABLET | Freq: Four times a day (QID) | ORAL | Status: DC | PRN
  Administered 2011-05-18: 4 mg via ORAL
  Filled 2011-05-15: qty 1

## 2011-05-15 MED ORDER — SODIUM CHLORIDE 0.9 % IV SOLN
80.0000 mg | INTRAVENOUS | Status: AC
  Administered 2011-05-15: 80 mg via INTRAVENOUS
  Filled 2011-05-15: qty 80

## 2011-05-15 MED ORDER — TIOTROPIUM BROMIDE MONOHYDRATE 18 MCG IN CAPS
18.0000 ug | ORAL_CAPSULE | Freq: Every day | RESPIRATORY_TRACT | Status: DC
  Administered 2011-05-16 – 2011-05-20 (×5): 18 ug via RESPIRATORY_TRACT
  Filled 2011-05-15 (×2): qty 5

## 2011-05-15 MED ORDER — FENTANYL 50 MCG/HR TD PT72
100.0000 ug | MEDICATED_PATCH | TRANSDERMAL | Status: DC
  Administered 2011-05-16: 100 ug via TRANSDERMAL
  Filled 2011-05-15: qty 1

## 2011-05-15 MED ORDER — ONDANSETRON HCL 4 MG/2ML IJ SOLN
4.0000 mg | Freq: Four times a day (QID) | INTRAMUSCULAR | Status: DC | PRN
  Administered 2011-05-17 – 2011-05-18 (×2): 4 mg via INTRAVENOUS
  Filled 2011-05-15 (×2): qty 2

## 2011-05-15 MED ORDER — ACETAMINOPHEN 325 MG PO TABS
650.0000 mg | ORAL_TABLET | Freq: Four times a day (QID) | ORAL | Status: DC | PRN
  Administered 2011-05-16 – 2011-05-18 (×4): 650 mg via ORAL
  Filled 2011-05-15 (×4): qty 2

## 2011-05-15 MED ORDER — SODIUM CHLORIDE 0.9 % IV SOLN
8.0000 mg/h | INTRAVENOUS | Status: AC
  Administered 2011-05-15: 8 mg/h via INTRAVENOUS
  Filled 2011-05-15: qty 80

## 2011-05-15 MED ORDER — HYDROMORPHONE HCL PF 1 MG/ML IJ SOLN
0.5000 mg | INTRAMUSCULAR | Status: DC | PRN
  Administered 2011-05-16 – 2011-05-18 (×4): 0.5 mg via INTRAVENOUS
  Filled 2011-05-15 (×4): qty 1

## 2011-05-15 MED ORDER — SODIUM CHLORIDE 0.9 % IV SOLN
INTRAVENOUS | Status: DC
  Administered 2011-05-16 (×2): via INTRAVENOUS

## 2011-05-15 MED ORDER — ACETAMINOPHEN 650 MG RE SUPP: 650.0000 mg | Freq: Four times a day (QID) | RECTAL | Status: DC | PRN

## 2011-05-15 NOTE — ED Provider Notes (Signed)
History     CSN: [id number] Arrival date & time: 05/15/2011  3:58 PM   First MD Initiated Contact with Patient 05/15/11 1646      Chief Complaint  Patient presents with  . GI Problem    (Consider location/radiation/quality/duration/timing/severity/associated sxs/prior treatment) Patient is a 55 y.o. female presenting with GI illlness. The history is provided by the patient.  GI Problem  This is a new problem. The current episode started 2 days ago. The problem occurs 2 to 4 times per day. The problem has not changed since onset.There has been no fever. Pertinent negatives include no abdominal pain, no vomiting, no chills, no headaches and no arthralgias. She has tried nothing for the symptoms.  PT reports that she has noticed dark tarry stools that have worsened.  She reports near syncope as well.  States she has had dim vision, palpitations, sweating when standing.    Past Medical History  Diagnosis Date  . Hypertension   . Hyperlipemia     Past Surgical History  Procedure Date  . Back surgery   . Brain surgery   . Abdominal hysterectomy     No family history on file.  History  Substance Use Topics  . Smoking status: Current Everyday Smoker  . Smokeless tobacco: Not on file  . Alcohol Use: No    OB History    Grav Para Term Preterm Abortions TAB SAB Ect Mult Living                  Review of Systems  Constitutional: Negative for chills.  Gastrointestinal: Positive for nausea and blood in stool. Negative for vomiting, abdominal pain and diarrhea.  Musculoskeletal: Negative for arthralgias.  Neurological: Negative for headaches.  All other systems reviewed and are negative.    Allergies  Latex  Home Medications   Current Outpatient Rx  Name Route Sig Dispense Refill  . ASPIRIN 81 MG PO CHEW Oral Chew 81 mg by mouth daily.      Marland Kitchen BUTALBITAL-APAP-CAFFEINE 50-325-40 MG PO TABS Oral Take 1 tablet by mouth daily as needed. For headaches      . CLOPIDOGREL  BISULFATE 75 MG PO TABS Oral Take 75 mg by mouth daily.      . CYCLOBENZAPRINE HCL 10 MG PO TABS Oral Take 10 mg by mouth 3 (three) times daily.      Marland Kitchen DEXAMETHASONE 4 MG PO TABS Oral Take 2 mg by mouth 3 (three) times daily. For 5 days started 05-11-11     . DULOXETINE HCL 60 MG PO CPEP Oral Take 60 mg by mouth daily.      . FENTANYL 100 MCG/HR TD PT72 Transdermal Place 1 patch onto the skin every other day. Sig states every 2 days     . GABAPENTIN 300 MG PO CAPS Oral Take 300 mg by mouth 3 (three) times daily.      . IBUPROFEN 800 MG PO TABS Oral Take 800 mg by mouth every 8 (eight) hours as needed. For pain      . MELOXICAM 15 MG PO TABS Oral Take 15 mg by mouth daily.      . OXYCODONE HCL 15 MG PO TABS Oral Take 15 mg by mouth daily as needed. For pain for 30 days      . OXYMETAZOLINE HCL 0.05 % NA SOLN Nasal Place 2 sprays into the nose 2 (two) times daily.      Marland Kitchen PENTOSAN POLYSULFATE SODIUM 100 MG PO CAPS Oral Take 200  mg by mouth 2 (two) times daily.      Marland Kitchen RANITIDINE HCL 300 MG PO TABS Oral Take 300 mg by mouth at bedtime.      Marland Kitchen ROSUVASTATIN CALCIUM 10 MG PO TABS Oral Take 10 mg by mouth daily.      Marland Kitchen TIOTROPIUM BROMIDE MONOHYDRATE 18 MCG IN CAPS Inhalation Place 18 mcg into inhaler and inhale daily.        BP 111/65  Pulse 96  Temp(Src) 98 F (36.7 C) (Oral)  Resp 18  SpO2 100%  Physical Exam  Nursing note and vitals reviewed. Constitutional: She is oriented to person, place, and time. She appears well-developed and well-nourished. No distress.  HENT:  Head: Normocephalic and atraumatic.  Eyes: Conjunctivae are normal. Pupils are equal, round, and reactive to light.  Neck: Normal range of motion.  Cardiovascular: Normal rate and regular rhythm.   No murmur heard. Pulmonary/Chest: Effort normal and breath sounds normal. No respiratory distress.  Abdominal: Soft. There is no tenderness. There is no rebound and no guarding.  Genitourinary: Guaiac positive stool.    Musculoskeletal: Normal range of motion. She exhibits no edema.  Neurological: She is alert and oriented to person, place, and time.  Skin: Skin is warm and dry.    ED Course  [place] bedside Date/Time: 05/15/2011 5:40 PM Performed by: Nena Alexander Authorized by: Dayton Bailiff Consent: Verbal consent obtained. Risks and benefits: risks, benefits and alternatives were discussed Preparation: Patient was prepped and draped in the usual sterile fashion. Local anesthesia used: no Patient sedated: no Comments: Placement of peripheral IV using real time [place]. Indication: Unable to place peripheral IV. In L AC vein was located using real time [place].  Under [place] 20 gauge IV catheter was placed into vein.  Dark Non pulsatile blood was noted to return.  Pt tolerated procedure well.  Minimal blood loss.     Date/Time: 05/15/2011 5:40 PM Performed by: Nena Alexander Authorized by: Dayton Bailiff Consent: Verbal consent obtained. Risks and benefits: risks, benefits and alternatives were discussed Preparation: Patient was prepped and draped in the usual sterile fashion. Local anesthesia used: no Patient sedated: no Comments: Placement of peripheral IV using real time [place]. Indication: Unable to place peripheral IV. In R AC vein was located using real time [place].  Under [place] 20 gauge IV catheter was placed into vein.  Dark Non pulsatile blood was noted to return.  Pt tolerated procedure well.  Minimal blood loss.    (including critical care time)  Labs Reviewed  CBC - Abnormal; Notable for the following:    WBC 24.5 (*)    RBC 3.16 (*)    Hemoglobin 9.3 (*)    HCT 28.2 (*)    All other components within normal limits  DIFFERENTIAL - Abnormal; Notable for the following:    Neutro Abs 14.2 (*)    Lymphs Abs 8.3 (*)    Monocytes Absolute 2.0 (*)    All other components within normal limits  COMPREHENSIVE METABOLIC PANEL - Abnormal; Notable for the following:    Glucose, Bld 101 (*)    Total Bilirubin  0.2 (*)    All other components within normal limits  LACTIC ACID, PLASMA - Abnormal; Notable for the following:    Lactic Acid, Venous 2.3 (*)    All other components within normal limits  LIPASE, BLOOD  URINALYSIS, ROUTINE W REFLEX MICROSCOPIC  PROTIME-INR  TYPE AND SCREEN  PREPARE RBC (CROSSMATCH)  ABO/RH  OCCULT BLOOD, POC DEVICE  PLATELET FUNCTION ASSAY  POCT OCCULT BLOOD STOOL, DEVICE   No results found.   1. GI bleeding       MDM  Pt presented hx of aneurysm stenting done at [facility] <1 week ago presents with dark tarry stools that began a couple of days ago. Exam showed nontender abd, heme positive with frank melana.  Hgb dropped from baseline 14 to 9.  Symptomatic at home.  Coags neg.  IVF given, started protonix drip.  Contacted [facility] who would see pt.  Contacted hospitalist who stated since she has been seen at [facility] for aneurysm recommended transfer there for further care.  Feel WBC is due to recent dexamethasone that was started.  No signs of infection.  Contacted [facility].  Pt to be admitted by hospitalist [facility].          Nena Alexander, MD 05/15/11 [phone]

## 2011-05-15 NOTE — ED Provider Notes (Signed)
Medical screening examination/treatment/procedure(s) were conducted as a shared visit with non-physician practitioner(s) and myself.  I personally evaluated the patient during the encounter  Recent stent placed for a brain aneurysm. Start on Plavix. This was performed at [facility]. Patient presents with epigastric pain and melanotic stool. This has been present for about 2 days. No other complaints.  Regular rate and rhythm. Mild epigastric pain. Lungs are clear auscultation bilaterally.  Patient with a GI bleed. Was placed on a PPI drip. Hemoglobin 9. We discussed the case initially with the hospitalist here who asked [place] to discuss the case with [facility] to consider transfer. I spoke with Dr. Langston Masker from the step down unit at [facility] who felt we had to go to care for this patient therefore I rediscussed the care with the hospitalist here. They decided to admit the patient here to the step down unit.   Dayton Bailiff, MD 05/15/11 [phone]

## 2011-05-15 NOTE — ED Notes (Signed)
Onset 2 days ago shortness of breath and 5 episodes of black tarry stool  ax4

## 2011-05-15 NOTE — ED Notes (Signed)
Pt. states that urine has strong odor.--jp

## 2011-05-15 NOTE — ED Notes (Signed)
EMS and patient stated difficult IV start EMS attempted once IV team attempted unable Doctor notified.

## 2011-05-15 NOTE — ED Notes (Signed)
Spoke with EDP about administering pRBCs; wants to hold off on prbcs since pts hgb is 9.3

## 2011-05-15 NOTE — H&P (Signed)
Yolanda Yang is an 55 y.o. female.   Chief Complaint: Dark stools HPI: 55 year old female with previous h/o GI bleed four to five years ago and at that time EGD had showed duodenal ulcer as per patient, presents to ER because patient has been having black stools over the last two days with dizziness whenever she gets up. Patient has had at least five bowel movement over the last two days. In addition patient has been having epigastic discomfort like a burning sensation. Denies any vomiting but has nausea. Patient had intracranial stents placed at [facility] last week on Nov 7th and hs been on plavix since Nov 2nd for the same. Patient was also started on decadron orally. Patient in addition has been taking ibuprofen and meloxicam and elmiron. Patient's hemoglobin is around 9. Patient's rectal exam revealed stool guiac positive.  as per ER physician Dr Brooke Dare. Dr Brooke Dare did speak with physicians at [facility] about the patient but they felt patient can be managed at our hospital. Dr Russella Dar gastroenterologist on call has been already consulted by Dr Brooke Dare and I have also discussed with Dr Russella Dar.  Past Medical History  Diagnosis Date  . Hypertension   . Hyperlipemia     Past Surgical History  Procedure Date  . Back surgery   . Brain surgery   . Abdominal hysterectomy     Family History  Problem Relation Age of Onset  . Colon cancer Father    Social History:  reports that she has been smoking.  She does not have any smokeless tobacco history on file. She reports that she does not drink alcohol or use illicit drugs.  Allergies:  Allergies  Allergen Reactions  . Latex     Medications Prior to Admission  Medication Dose Route Frequency Provider Last Rate Last Dose  . 0.9 %  sodium chloride infusion   Intravenous Continuous Eduard Clos      . acetaminophen (TYLENOL) tablet 650 mg  650 mg Oral Q6H PRN Eduard Clos       Or  . acetaminophen (TYLENOL) suppository 650 mg  650  mg Rectal Q6H PRN Eduard Clos      . fentaNYL (DURAGESIC - dosed mcg/hr) patch 100 mcg  100 mcg Transdermal Q48H Eduard Clos      . HYDROmorphone (DILAUDID) injection 0.5 mg  0.5 mg Intravenous Q4H PRN Eduard Clos      . ondansetron (ZOFRAN) tablet 4 mg  4 mg Oral Q6H PRN Eduard Clos       Or  . ondansetron (ZOFRAN) injection 4 mg  4 mg Intravenous Q6H PRN Eduard Clos      . pantoprazole (PROTONIX) 80 mg in sodium chloride 0.9 % 100 mL IVPB  80 mg Intravenous To Major Nena Alexander, MD   80 mg at 05/15/11 1816  . pantoprazole (PROTONIX) 80 mg in sodium chloride 0.9 % 250 mL infusion  8 mg/hr Intravenous To Major Nena Alexander, MD 25 mL/hr at 05/15/11 1817 8 mg/hr at 05/15/11 1817  . pantoprazole (PROTONIX) 80 mg in sodium chloride 0.9 % 250 mL infusion  8 mg/hr Intravenous Continuous Eduard Clos      . tiotropium (SPIRIVA) inhalation capsule 18 mcg  18 mcg Inhalation Daily Eduard Clos       No current outpatient prescriptions on file as of 05/15/2011.    Results for orders placed during the hospital encounter of 05/15/11 (from the past 48  hour(s))  OCCULT BLOOD, POC DEVICE     Status: Normal   Collection Time   05/15/11  5:00 PM      Component Value Range Comment   Fecal Occult Bld POSITIVE     CBC     Status: Abnormal   Collection Time   05/15/11  5:01 PM      Component Value Range Comment   WBC 24.5 (*) 4.0 - 10.5 (K/uL)    RBC 3.16 (*) 3.87 - 5.11 (MIL/uL)    Hemoglobin 9.3 (*) 12.0 - 15.0 (g/dL)    HCT 78.2 (*) 95.6 - 46.0 (%)    MCV 89.2  78.0 - 100.0 (fL)    MCH 29.4  26.0 - 34.0 (pg)    MCHC 33.0  30.0 - 36.0 (g/dL)    RDW 21.3  08.6 - 57.8 (%)    Platelets 349  150 - 400 (K/uL)   DIFFERENTIAL     Status: Abnormal   Collection Time   05/15/11  5:01 PM      Component Value Range Comment   Neutrophils Relative 58  43 - 77 (%)    Lymphocytes Relative 34  12 - 46 (%)    Monocytes Relative 8  3 - 12 (%)     Eosinophils Relative 0  0 - 5 (%)    Basophils Relative 0  0 - 1 (%)    Neutro Abs 14.2 (*) 1.7 - 7.7 (K/uL)    Lymphs Abs 8.3 (*) 0.7 - 4.0 (K/uL)    Monocytes Absolute 2.0 (*) 0.1 - 1.0 (K/uL)    Eosinophils Absolute 0.0  0.0 - 0.7 (K/uL)    Basophils Absolute 0.0  0.0 - 0.1 (K/uL)    RBC Morphology POLYCHROMASIA PRESENT      WBC Morphology ATYPICAL LYMPHOCYTES   WHITE COUNT CONFIRMED ON SMEAR   Smear Review LARGE PLATELETS PRESENT     COMPREHENSIVE METABOLIC PANEL     Status: Abnormal   Collection Time   05/15/11  5:01 PM      Component Value Range Comment   Sodium 139  135 - 145 (mEq/L)    Potassium 4.0  3.5 - 5.1 (mEq/L)    Chloride 103  96 - 112 (mEq/L)    CO2 25  19 - 32 (mEq/L)    Glucose, Bld 101 (*) 70 - 99 (mg/dL)    BUN 23  6 - 23 (mg/dL)    Creatinine, Ser 4.69  0.50 - 1.10 (mg/dL)    Calcium 9.8  8.4 - 10.5 (mg/dL)    Total Protein 7.4  6.0 - 8.3 (g/dL)    Albumin 4.0  3.5 - 5.2 (g/dL)    AST 18  0 - 37 (U/L)    ALT 27  0 - 35 (U/L)    Alkaline Phosphatase 57  39 - 117 (U/L)    Total Bilirubin 0.2 (*) 0.3 - 1.2 (mg/dL)    GFR calc non Af Amer >90  >90 (mL/min)    GFR calc Af Amer >90  >90 (mL/min)   LIPASE, BLOOD     Status: Normal   Collection Time   05/15/11  5:01 PM      Component Value Range Comment   Lipase 15  11 - 59 (U/L)   LACTIC ACID, PLASMA     Status: Abnormal   Collection Time   05/15/11  5:16 PM      Component Value Range Comment   Lactic Acid, Venous 2.3 (*) 0.5 -  2.2 (mmol/L)   PROTIME-INR     Status: Normal   Collection Time   05/15/11  5:19 PM      Component Value Range Comment   Prothrombin Time 13.2  11.6 - 15.2 (seconds)    INR 0.98  0.00 - 1.49    TYPE AND SCREEN     Status: Normal (Preliminary result)   Collection Time   05/15/11  5:23 PM      Component Value Range Comment   ABO/RH(D) O POS      Antibody Screen NEG      Sample Expiration 05/18/2011      Unit Number [id number]      Blood Component Type RED CELLS,LR      Unit  division 00      Status of Unit ALLOCATED      Transfusion Status OK TO TRANSFUSE      Crossmatch Result Compatible      Unit Number [id number]      Blood Component Type RED CELLS,LR      Unit division 00      Status of Unit ALLOCATED      Transfusion Status OK TO TRANSFUSE      Crossmatch Result Compatible     ABO/RH     Status: Normal   Collection Time   05/15/11  5:23 PM      Component Value Range Comment   ABO/RH(D) O POS     PLATELET FUNCTION ASSAY     Status: Abnormal   Collection Time   05/15/11  5:35 PM      Component Value Range Comment   Collagen / Epinephrine 282 (*) 0 - 197 (seconds)    Collagen / ADP 238 (*) 0 - 108 (seconds)    PFA Interpretation (NOTE)     PREPARE RBC (CROSSMATCH)     Status: Normal   Collection Time   05/15/11  6:19 PM      Component Value Range Comment   Order Confirmation ORDER PROCESSED BY BLOOD BANK     URINALYSIS, ROUTINE W REFLEX MICROSCOPIC     Status: Normal   Collection Time   05/15/11  6:42 PM      Component Value Range Comment   Color, Urine YELLOW  YELLOW     Appearance CLEAR  CLEAR     Specific Gravity, Urine 1.022  1.005 - 1.030     pH 6.5  5.0 - 8.0     Glucose, UA NEGATIVE  NEGATIVE (mg/dL)    Hgb urine dipstick NEGATIVE  NEGATIVE     Bilirubin Urine NEGATIVE  NEGATIVE     Ketones, ur NEGATIVE  NEGATIVE (mg/dL)    Protein, ur NEGATIVE  NEGATIVE (mg/dL)    Urobilinogen, UA 0.2  0.0 - 1.0 (mg/dL)    Nitrite NEGATIVE  NEGATIVE     Leukocytes, UA NEGATIVE  NEGATIVE  MICROSCOPIC NOT DONE ON URINES WITH NEGATIVE PROTEIN, BLOOD, LEUKOCYTES, NITRITE, OR GLUCOSE <1000 mg/dL.   No results found.  Review of Systems  HENT: Negative.   Eyes: Negative.   Respiratory: Negative.   Cardiovascular: Negative.   Gastrointestinal: Positive for abdominal pain, blood in stool and melena.  Genitourinary:       Mal odorous urine  Musculoskeletal: Negative.   Skin: Negative.   Neurological: Positive for dizziness and weakness.    Endo/Heme/Allergies: Negative.   Psychiatric/Behavioral: Negative.     Blood pressure 98/62, pulse 84, temperature 98.2 F (36.8 C), temperature source Oral, resp. rate 18, SpO2  100.00%. Physical Exam  Constitutional: She is oriented to person, place, and time. She appears well-developed and well-nourished.  HENT:  Head: Normocephalic and atraumatic.  Right Ear: External ear normal.  Left Ear: External ear normal.  Nose: Nose normal.  Mouth/Throat: Oropharynx is clear and moist.  Eyes:       Has pallor but no icterus  Neck: Normal range of motion. Neck supple.  Cardiovascular: Normal rate, regular rhythm, normal heart sounds and intact distal pulses.   Respiratory: Effort normal and breath sounds normal.  GI: Soft.       Mild epigastric tenderness  Musculoskeletal: Normal range of motion.  Neurological: She is alert and oriented to person, place, and time. She has normal reflexes.  Skin: Skin is warm and dry.  Psychiatric: Her behavior is normal.     Assessment/Plan 1)Acute GI Bleed 2)Acute blood loss anemia 3)Recent intracranial stent placement and on plavix and decadron 4)On NSAID 5)H/0 Duodenal ulcer with GI Bleed four to five years ago 6)On going tobacco abuse  Plan:  Admit to Step Down Patient has been started on protonix infusion which we will continue. Keep patient NPO. Recheck CBC STAT and frequently. Type and cross match PRBC and hold and transfuse as necessary. Will be holding off plavix, aspirin and NSAIDS and decadron.  Patient and her husband is concerned about holding off her plavix. They understand that patient is having acute bleed. Need to get in touch with her interventional radiologist at [facility] [place] to address the patient's conerns. Dr Russella Dar gastroenterologist to see the patient. Will follow his recommendations.  Eduard Clos 05/15/2011, 9:49 PM

## 2011-05-16 DIAGNOSIS — D72829 Elevated white blood cell count, unspecified: Secondary | ICD-10-CM | POA: Diagnosis present

## 2011-05-16 DIAGNOSIS — I9589 Other hypotension: Secondary | ICD-10-CM | POA: Diagnosis present

## 2011-05-16 DIAGNOSIS — Z720 Tobacco use: Secondary | ICD-10-CM | POA: Diagnosis present

## 2011-05-16 DIAGNOSIS — Z95820 Peripheral vascular angioplasty status with implants and grafts: Secondary | ICD-10-CM

## 2011-05-16 DIAGNOSIS — IMO0002 Reserved for concepts with insufficient information to code with codable children: Secondary | ICD-10-CM

## 2011-05-16 DIAGNOSIS — R58 Hemorrhage, not elsewhere classified: Secondary | ICD-10-CM | POA: Diagnosis present

## 2011-05-16 LAB — CBC
Hemoglobin: 9 g/dL — ABNORMAL LOW (ref 12.0–15.0)
MCHC: 33.1 g/dL (ref 30.0–36.0)
MCV: 90.7 fL (ref 78.0–100.0)
Platelets: 272 10*3/uL (ref 150–400)
Platelets: 243 10*3/uL (ref 150–400)
Platelets: 238 10*3/uL (ref 150–400)
RBC: 2.37 MIL/uL — ABNORMAL LOW (ref 3.87–5.11)
RBC: 3.09 MIL/uL — ABNORMAL LOW (ref 3.87–5.11)
RDW: 15.9 % — ABNORMAL HIGH (ref 11.5–15.5)
WBC: 18.8 10*3/uL — ABNORMAL HIGH (ref 4.0–10.5)
WBC: 16.4 10*3/uL — ABNORMAL HIGH (ref 4.0–10.5)

## 2011-05-16 LAB — COMPREHENSIVE METABOLIC PANEL
ALT: 22 U/L (ref 0–35)
AST: 16 U/L (ref 0–37)
Alkaline Phosphatase: 46 U/L (ref 39–117)
CO2: 23 mEq/L (ref 19–32)
Chloride: 108 mEq/L (ref 96–112)
Creatinine, Ser: 0.63 mg/dL (ref 0.50–1.10)
GFR calc non Af Amer: >90 mL/min (ref >90)
Sodium: 141 mEq/L (ref 135–145)
Total Bilirubin: 0.2 mg/dL — ABNORMAL LOW (ref 0.3–1.2)

## 2011-05-16 LAB — GLUCOSE, CAPILLARY
Glucose-Capillary: 111 mg/dL — ABNORMAL HIGH (ref 70–99)
Glucose-Capillary: 93 mg/dL (ref 70–99)
Glucose-Capillary: 74 mg/dL (ref 70–99)

## 2011-05-16 MED ORDER — ASPIRIN 81 MG PO CHEW: 81.0000 mg | CHEWABLE_TABLET | Freq: Once | ORAL | Status: DC

## 2011-05-16 MED ORDER — DIPHENHYDRAMINE HCL 50 MG/ML IJ SOLN
12.5000 mg | Freq: Three times a day (TID) | INTRAMUSCULAR | Status: DC | PRN
  Administered 2011-05-16 – 2011-05-17 (×3): 12.5 mg via INTRAVENOUS
  Filled 2011-05-16 (×3): qty 1

## 2011-05-16 MED ORDER — CLOPIDOGREL BISULFATE 75 MG PO TABS
75.0000 mg | ORAL_TABLET | Freq: Once | ORAL | Status: AC
Start: 2011-05-16 — End: 2011-05-16
  Administered 2011-05-16: 75 mg via ORAL
  Filled 2011-05-16: qty 1

## 2011-05-16 MED ORDER — ASPIRIN 81 MG PO CHEW
81.0000 mg | CHEWABLE_TABLET | Freq: Every day | ORAL | Status: DC
  Administered 2011-05-16 – 2011-05-20 (×5): 81 mg via ORAL
  Filled 2011-05-16 (×6): qty 1

## 2011-05-16 MED ORDER — SODIUM CHLORIDE 0.9 % IV SOLN
Freq: Once | INTRAVENOUS | Status: AC
  Administered 2011-05-16: 17:00:00 via INTRAVENOUS

## 2011-05-16 MED ORDER — CLOPIDOGREL BISULFATE 75 MG PO TABS
75.0000 mg | ORAL_TABLET | Freq: Every day | ORAL | Status: DC
  Administered 2011-05-17 – 2011-05-20 (×4): 75 mg via ORAL
  Filled 2011-05-16 (×5): qty 1

## 2011-05-16 NOTE — Progress Notes (Signed)
Utilization Review Completed.Yolanda Yang T11/15/2012   

## 2011-05-16 NOTE — Progress Notes (Addendum)
Subjective: She endorses last melenotic stool at 7pm on 11/14. She complains she is hungry and she has asked her family to bring in food despite established order for NPO until evaluated by GI physician and even after conversation with the attending MD advising her not to eat in the setting of acute GI bleed. She denies abdominal pain. She also states that she spoke with the Interventional Neurology nurse at [facility]  This morning and was told not the stop the Plavix.  Patient states she has a very small amount of dark color urine.    Objective: Vital signs in last 24 hours: Temp:  [97.8 F (36.6 C)-98.6 F (37 C)] 98.2 F (36.8 C) (11/15 1207) Pulse Rate:  [75-97] 82  (11/15 1200) Resp:  [10-20] 12  (11/15 1200) BP: (91-123)/(51-70) 106/60 mmHg (11/15 1200) SpO2:  [99 %-100 %] 100 % (11/15 0837) Weight:  [79.5 kg (175 lb 4.3 oz)-80 kg (176 lb 5.9 oz)] 176 lb 5.9 oz (80 kg) (11/15 0423) Weight change:  Last BM Date: 05/15/11  Intake/Output from previous day: 11/14 0701 - 11/15 0700 In: 1295 [I.V.:945; Blood:350] Out: 300 [Urine:300] Intake/Output this shift: Total I/O In: 650 [I.V.:300; Blood:350] Out: -   General appearance: alert, appears stated age and no distress Resp: clear to auscultation bilaterally Cardio: regular rate and rhythm, S1, S2 normal, no murmur, click, rub or gallop GI: soft, non-tender; bowel sounds normal; no masses,  no organomegaly Extremities: extremities normal, atraumatic, no cyanosis or edema Skin: Skin color, texture, turgor normal. No rashes or lesions Neurologic: Alert and oriented X 3, normal strength and tone. Normal symmetric reflexes. Normal coordination and gait  Lab Results:  [facility] 05/16/11 0428 05/15/11 2139  WBC 18.8* 21.7*  HGB 7.1* 7.8*  HCT 21.5* 24.1*  PLT 272 318   BMET  Basename 05/16/11 0428 05/15/11 1701  NA 141 139  K 3.5 4.0  CL 108 103  CO2 23 25  GLUCOSE 99 101*  BUN 17 23  CREATININE 0.63 0.70  CALCIUM 8.1* 9.8     Studies/Results: No results found.  Medications: I have reviewed the patient's current medications.  Assessment/Plan:  Principal Problem:  *Acute GI bleeding/GERD/History of duodenal ulcer Appreciate GI/Dr. Elnoria Howard assistance. Plan EGD in am- NPO after MN.  On PPIs.  Cont Plavix per GI.   Active Problems:  Anemia associated with acute blood loss Hgb at presentation 9 now down to 7.1. Baseline between 10 and 11. Orders already in place to transfuse 2 units PRBC's. Will order CBC for 2 hours post transfusion.   Hypotension due to blood loss BP still soft but also needs blood. Expect will improve after transfusion Will need to address whether to start IVF after blood transfusion is complete.  Leukocytosis Likely reactive due to acute bleeding and relative hypotension. Follow up in am. No fever.   LOW BACK PAIN, CHRONIC  Was on 2 NSAIDs and a steroid taper pre admit Would not resume NSAID after dc.   Status post cerebral angioplasty with stent Per patient she has been adamantly instructed that she can't stop new Plavix or the stent will occlude. GI aware and recommends continue Plavix. Would like to obtain records from [facility] if possible.   ASTHMA Not exacerbated   CYSTITIS, CHRONIC INTERSTITIAL No apparent change in baseline symptoms   Tobacco abuse  Disposition Remain in SDU  LOS: 1 day   ELLIS,ALLISON L. 05/16/2011, 3:06 PM   I have evaluated the patient, reviewed the chart and have edited  the above note. Agree with above plan.   Calvert Cantor MD

## 2011-05-16 NOTE — Progress Notes (Signed)
Pt eating eggs and grits even after being educated on being NPO.  Junious Silk, NP and Dr. Butler Denmark made aware.

## 2011-05-16 NOTE — Consult Note (Signed)
Reason for Consult: GI bleed Referring Physician: [facility]  [name] HPI: This is a 55 year old female with a PMH of duodenal ulcer 30 years ago, HTN, hyperlipidemia, asthma, and GERD who is admitted for anemia and heme positive stool.  She recently had intracranial stents placed for cerebral aneurysms and Plavix was started.  A couple of days ago she started to feel weak and tired.  Upon presentation to the hospital her HGB was in the 9 range, but with IV hydration it dropped down to 7.1 mg/dL.  She reports mild epigastric pain.  No nausea or vomiting.  She reports taking Protonix on a routine basis.  Past Medical History  Diagnosis Date  . Hypertension   . Hyperlipemia   . Shortness of breath   . Blood transfusion   . Headache   . GERD (gastroesophageal reflux disease)   . Anemia   . Asthma     Past Surgical History  Procedure Date  . Back surgery   . Brain surgery   . Abdominal hysterectomy     Family History  Problem Relation Age of Onset  . Colon cancer Father     Social History:  reports that she has been smoking Cigarettes.  She has been smoking about .5 packs per day. She does not have any smokeless tobacco history on file. She reports that she does not drink alcohol. Her drug history not on file.  Allergies:  Allergies  Allergen Reactions  . Latex     Medications:  Scheduled:   . fentaNYL  100 mcg Transdermal Q72H  . pantoprazole (PROTONIX) IV  80 mg Intravenous To Major  . pantoprozole (PROTONIX) infusion  8 mg/hr Intravenous To Major  . tiotropium  18 mcg Inhalation Daily  . DISCONTD: fentaNYL  100 mcg Transdermal Q48H   Continuous:   . sodium chloride 125 mL/hr at 05/16/11 0437  . pantoprozole (PROTONIX) infusion 8 mg/hr (05/16/11 0437)    Results for orders placed during the hospital encounter of 05/15/11 (from the past 24 hour(s))  OCCULT BLOOD, POC DEVICE     Status: Normal   Collection Time   05/15/11  5:00 PM      Component  Value Range   Fecal Occult Bld POSITIVE    CBC     Status: Abnormal   Collection Time   05/15/11  5:01 PM      Component Value Range   WBC 24.5 (*) 4.0 - 10.5 (K/uL)   RBC 3.16 (*) 3.87 - 5.11 (MIL/uL)   Hemoglobin 9.3 (*) 12.0 - 15.0 (g/dL)   HCT 40.9 (*) 81.1 - 46.0 (%)   MCV 89.2  78.0 - 100.0 (fL)   MCH 29.4  26.0 - 34.0 (pg)   MCHC 33.0  30.0 - 36.0 (g/dL)   RDW 91.4  78.2 - 95.6 (%)   Platelets 349  150 - 400 (K/uL)  DIFFERENTIAL     Status: Abnormal   Collection Time   05/15/11  5:01 PM      Component Value Range   Neutrophils Relative 58  43 - 77 (%)   Lymphocytes Relative 34  12 - 46 (%)   Monocytes Relative 8  3 - 12 (%)   Eosinophils Relative 0  0 - 5 (%)   Basophils Relative 0  0 - 1 (%)   Neutro Abs 14.2 (*) 1.7 - 7.7 (K/uL)   Lymphs Abs 8.3 (*) 0.7 - 4.0 (K/uL)   Monocytes Absolute 2.0 (*) 0.1 -  1.0 (K/uL)   Eosinophils Absolute 0.0  0.0 - 0.7 (K/uL)   Basophils Absolute 0.0  0.0 - 0.1 (K/uL)   RBC Morphology POLYCHROMASIA PRESENT     WBC Morphology ATYPICAL LYMPHOCYTES     Smear Review LARGE PLATELETS PRESENT    COMPREHENSIVE METABOLIC PANEL     Status: Abnormal   Collection Time   05/15/11  5:01 PM      Component Value Range   Sodium 139  135 - 145 (mEq/L)   Potassium 4.0  3.5 - 5.1 (mEq/L)   Chloride 103  96 - 112 (mEq/L)   CO2 25  19 - 32 (mEq/L)   Glucose, Bld 101 (*) 70 - 99 (mg/dL)   BUN 23  6 - 23 (mg/dL)   Creatinine, Ser 9.52  0.50 - 1.10 (mg/dL)   Calcium 9.8  8.4 - 84.1 (mg/dL)   Total Protein 7.4  6.0 - 8.3 (g/dL)   Albumin 4.0  3.5 - 5.2 (g/dL)   AST 18  0 - 37 (U/L)   ALT 27  0 - 35 (U/L)   Alkaline Phosphatase 57  39 - 117 (U/L)   Total Bilirubin 0.2 (*) 0.3 - 1.2 (mg/dL)   GFR calc non Af Amer >90  >90 (mL/min)   GFR calc Af Amer >90  >90 (mL/min)  LIPASE, BLOOD     Status: Normal   Collection Time   05/15/11  5:01 PM      Component Value Range   Lipase 15  11 - 59 (U/L)  LACTIC ACID, PLASMA     Status: Abnormal   Collection  Time   05/15/11  5:16 PM      Component Value Range   Lactic Acid, Venous 2.3 (*) 0.5 - 2.2 (mmol/L)  PROTIME-INR     Status: Normal   Collection Time   05/15/11  5:19 PM      Component Value Range   Prothrombin Time 13.2  11.6 - 15.2 (seconds)   INR 0.98  0.00 - 1.49   TYPE AND SCREEN     Status: Normal (Preliminary result)   Collection Time   05/15/11  5:23 PM      Component Value Range   ABO/RH(D) O POS     Antibody Screen NEG     Sample Expiration 05/18/2011     Unit Number [id number]     Blood Component Type RED CELLS,LR     Unit division 00     Status of Unit ALLOCATED     Transfusion Status OK TO TRANSFUSE     Crossmatch Result Compatible     Unit Number [id number]     Blood Component Type RED CELLS,LR     Unit division 00     Status of Unit ALLOCATED     Transfusion Status OK TO TRANSFUSE     Crossmatch Result Compatible     Unit Number [id number]     Blood Component Type RED CELLS,LR     Unit division 00     Status of Unit ISSUED     Transfusion Status OK TO TRANSFUSE     Crossmatch Result Compatible     Unit Number [id number]     Blood Component Type RED CELLS,LR     Unit division 00     Status of Unit ISSUED     Transfusion Status OK TO TRANSFUSE     Crossmatch Result Compatible    ABO/RH     Status: Normal   Collection Time  05/15/11  5:23 PM      Component Value Range   ABO/RH(D) O POS    PLATELET FUNCTION ASSAY     Status: Abnormal   Collection Time   05/15/11  5:35 PM      Component Value Range   Collagen / Epinephrine 282 (*) 0 - 197 (seconds)   Collagen / ADP 238 (*) 0 - 108 (seconds)   PFA Interpretation (NOTE)    PREPARE RBC (CROSSMATCH)     Status: Normal   Collection Time   05/15/11  6:19 PM      Component Value Range   Order Confirmation ORDER PROCESSED BY BLOOD BANK    URINALYSIS, ROUTINE W REFLEX MICROSCOPIC     Status: Normal   Collection Time   05/15/11  6:42 PM      Component Value Range   Color, Urine YELLOW  YELLOW    Appearance  CLEAR  CLEAR    Specific Gravity, Urine 1.022  1.005 - 1.030    pH 6.5  5.0 - 8.0    Glucose, UA NEGATIVE  NEGATIVE (mg/dL)   Hgb urine dipstick NEGATIVE  NEGATIVE    Bilirubin Urine NEGATIVE  NEGATIVE    Ketones, ur NEGATIVE  NEGATIVE (mg/dL)   Protein, ur NEGATIVE  NEGATIVE (mg/dL)   Urobilinogen, UA 0.2  0.0 - 1.0 (mg/dL)   Nitrite NEGATIVE  NEGATIVE    Leukocytes, UA NEGATIVE  NEGATIVE   CBC     Status: Abnormal   Collection Time   05/15/11  9:39 PM      Component Value Range   WBC 21.7 (*) 4.0 - 10.5 (K/uL)   RBC 2.68 (*) 3.87 - 5.11 (MIL/uL)   Hemoglobin 7.8 (*) 12.0 - 15.0 (g/dL)   HCT 16.1 (*) 09.6 - 46.0 (%)   MCV 89.9  78.0 - 100.0 (fL)   MCH 29.1  26.0 - 34.0 (pg)   MCHC 32.4  30.0 - 36.0 (g/dL)   RDW 04.5  40.9 - 81.1 (%)   Platelets 318  150 - 400 (K/uL)  PREPARE RBC (CROSSMATCH)     Status: Normal   Collection Time   05/15/11 10:30 PM      Component Value Range   Order Confirmation ORDER PROCESSED BY BLOOD BANK    MRSA PCR SCREENING     Status: Normal   Collection Time   05/15/11 11:00 PM      Component Value Range   MRSA by PCR NEGATIVE  NEGATIVE   GLUCOSE, CAPILLARY     Status: Abnormal   Collection Time   05/16/11  1:24 AM      Component Value Range   Glucose-Capillary 111 (*) 70 - 99 (mg/dL)  GLUCOSE, CAPILLARY     Status: Normal   Collection Time   05/16/11  4:23 AM      Component Value Range   Glucose-Capillary 93  70 - 99 (mg/dL)  CBC     Status: Abnormal   Collection Time   05/16/11  4:28 AM      Component Value Range   WBC 18.8 (*) 4.0 - 10.5 (K/uL)   RBC 2.37 (*) 3.87 - 5.11 (MIL/uL)   Hemoglobin 7.1 (*) 12.0 - 15.0 (g/dL)   HCT 91.4 (*) 78.2 - 46.0 (%)   MCV 90.7  78.0 - 100.0 (fL)   MCH 30.0  26.0 - 34.0 (pg)   MCHC 33.0  30.0 - 36.0 (g/dL)   RDW 95.6  21.3 - 08.6 (%)  Platelets 272  150 - 400 (K/uL)  COMPREHENSIVE METABOLIC PANEL     Status: Abnormal   Collection Time   05/16/11  4:28 AM      Component Value Range   Sodium 141   135 - 145 (mEq/L)   Potassium 3.5  3.5 - 5.1 (mEq/L)   Chloride 108  96 - 112 (mEq/L)   CO2 23  19 - 32 (mEq/L)   Glucose, Bld 99  70 - 99 (mg/dL)   BUN 17  6 - 23 (mg/dL)   Creatinine, Ser 2.13  0.50 - 1.10 (mg/dL)   Calcium 8.1 (*) 8.4 - 10.5 (mg/dL)   Total Protein 5.8 (*) 6.0 - 8.3 (g/dL)   Albumin 3.3 (*) 3.5 - 5.2 (g/dL)   AST 16  0 - 37 (U/L)   ALT 22  0 - 35 (U/L)   Alkaline Phosphatase 46  39 - 117 (U/L)   Total Bilirubin 0.2 (*) 0.3 - 1.2 (mg/dL)   GFR calc non Af Amer >90  >90 (mL/min)   GFR calc Af Amer >90  >90 (mL/min)  GLUCOSE, CAPILLARY     Status: Normal   Collection Time   05/16/11  8:03 AM      Component Value Range   Glucose-Capillary 74  70 - 99 (mg/dL)   Comment 1 Documented in Chart     Comment 2 Notify RN    GLUCOSE, CAPILLARY     Status: Normal   Collection Time   05/16/11 12:09 PM      Component Value Range   Glucose-Capillary 89  70 - 99 (mg/dL)   Comment 1 Documented in Chart     Comment 2 Notify RN       No results found.  ROS:  As stated above in the HPI otherwise negative.  Blood pressure 106/60, pulse 82, temperature 98.2 F (36.8 C), temperature source Oral, resp. rate 12, height 5\' 5"  (1.651 m), weight 80 kg (176 lb 5.9 oz), SpO2 100.00%.    PE: Gen: NAD, Alert and Oriented HEENT:  [place]/AT, EOMI Neck: Supple, no LAD Lungs: CTA Bilaterally CV: RRR without M/G/R ABM: Soft, NTND, +BS Ext: No C/C/E  Assessment/Plan: 1) Melena. 2) Intracerebral stents on Plavix. 3) Distant history of a duodenal ulcer.   The patient appears to have an upper GI bleed with her presentation, but a proximal right sided colonic source or small bowel source is possible.  Further evaluation is required.  Plan: 1) EGD tomorrow at 9 AM. 2) Continue with Protonix. 3) Continue with Plavix with her stents. 4) Transfuse as necessary. 5) NPO after midnight.  Osmin Welz D 05/16/2011, 1:52 PM

## 2011-05-16 NOTE — Progress Notes (Signed)
Received phone call from Willette Cluster, NP and informed her that pt is planning to eat even though she has been educated on being NPO.

## 2011-05-16 NOTE — Progress Notes (Signed)
I was called to see pt for UGIB this Am.  Pt revealed she had outpt colonoscopy  2 or so years ago with Dr. Loreta Ave.   I called the MD office, where I confirmed that Mrs. Yolanda Yang is an active pt of Dr. Kenna Gilbert.  Alerted Junious Silk of this.  She will call consult to DR. Mann.

## 2011-05-16 NOTE — Progress Notes (Signed)
Agree 

## 2011-05-16 NOTE — Progress Notes (Signed)
Spoke with Jennye Moccasin, PA in regards to pt insisting on "eating no matter what."

## 2011-05-16 NOTE — Progress Notes (Signed)
I have just received a call from Dr Jake Samples (interventional radiology at [facility]) who placed a "pipeline stent" in an aneurysm in attempt to thrombose it. He recommends that Plavix and Aspirin not be discontinued for a minimum of 3 months and possibly even 6 months. A baby ASA should be sufficient.  Dr Elnoria Howard already recommends to continue the Plavix. I am ordering both now.   In addition, during the above procedure the right carotid was nicked and a stent was placed in it as well.   Contact numbers Dr Jake Samples Pager- [phone]  If he cannot be reached, please call care coordinator:  Hermelinda Dellen Office [phone] Pager [phone]

## 2011-05-16 NOTE — Progress Notes (Signed)
Pt eating grilled chicken salad even though she is aware of the importance of being compliant of her ordered diet.

## 2011-05-17 ENCOUNTER — Encounter (HOSPITAL_COMMUNITY): Payer: Self-pay | Admitting: Gastroenterology

## 2011-05-17 ENCOUNTER — Inpatient Hospital Stay (HOSPITAL_COMMUNITY): Payer: Medicare Other

## 2011-05-17 ENCOUNTER — Encounter (HOSPITAL_COMMUNITY)
Admission: EM | Disposition: A | Discharge status: Home / Self Care | Payer: Self-pay | Source: Home / Self Care | Attending: Internal Medicine

## 2011-05-17 HISTORY — PX: ESOPHAGOGASTRODUODENOSCOPY: SHX5428

## 2011-05-17 LAB — GLUCOSE, CAPILLARY
Glucose-Capillary: 95 mg/dL (ref 70–99)
Glucose-Capillary: 177 mg/dL — ABNORMAL HIGH (ref 70–99)
Glucose-Capillary: 69 mg/dL — ABNORMAL LOW (ref 70–99)
Glucose-Capillary: 98 mg/dL (ref 70–99)

## 2011-05-17 LAB — CBC
HCT: 27 % — ABNORMAL LOW (ref 36.0–46.0)
HCT: 27.1 % — ABNORMAL LOW (ref 36.0–46.0)
MCV: 88.5 fL (ref 78.0–100.0)
Platelets: 235 10*3/uL (ref 150–400)
Platelets: 255 10*3/uL (ref 150–400)
RBC: 3.05 MIL/uL — ABNORMAL LOW (ref 3.87–5.11)
RDW: 16.1 % — ABNORMAL HIGH (ref 11.5–15.5)
WBC: 10.5 10*3/uL (ref 4.0–10.5)
WBC: 13.3 10*3/uL — ABNORMAL HIGH (ref 4.0–10.5)

## 2011-05-17 LAB — BASIC METABOLIC PANEL
CO2: 26 mEq/L (ref 19–32)
Chloride: 113 mEq/L — ABNORMAL HIGH (ref 96–112)
Glucose, Bld: 85 mg/dL (ref 70–99)
Potassium: 3.8 mEq/L (ref 3.5–5.1)
Sodium: 144 mEq/L (ref 135–145)

## 2011-05-17 SURGERY — EGD (ESOPHAGOGASTRODUODENOSCOPY)
Anesthesia: Moderate Sedation

## 2011-05-17 MED ORDER — MIDAZOLAM HCL 10 MG/2ML IJ SOLN
INTRAMUSCULAR | Status: AC
  Filled 2011-05-17: qty 2

## 2011-05-17 MED ORDER — FENTANYL CITRATE 0.05 MG/ML IJ SOLN
INTRAMUSCULAR | Status: AC
  Filled 2011-05-17: qty 2

## 2011-05-17 MED ORDER — SUCRALFATE 1 G PO TABS
1.0000 g | ORAL_TABLET | Freq: Three times a day (TID) | ORAL | Status: DC
  Administered 2011-05-17 – 2011-05-20 (×14): 1 g via ORAL
  Filled 2011-05-17 (×16): qty 1

## 2011-05-17 MED ORDER — DEXTROSE 50 % IV SOLN
INTRAVENOUS | Status: AC
  Administered 2011-05-17: 08:00:00
  Filled 2011-05-17: qty 50

## 2011-05-17 MED ORDER — BUTAMBEN-TETRACAINE-BENZOCAINE 2-2-14 % EX AERO
INHALATION_SPRAY | CUTANEOUS | Status: DC | PRN
  Administered 2011-05-17: 2 via TOPICAL

## 2011-05-17 MED ORDER — MIDAZOLAM HCL 10 MG/2ML IJ SOLN
INTRAMUSCULAR | Status: DC | PRN
  Administered 2011-05-17 (×3): 2 mg via INTRAVENOUS

## 2011-05-17 MED ORDER — FENTANYL NICU IV SYRINGE 50 MCG/ML
INJECTION | INTRAMUSCULAR | Status: DC | PRN
  Administered 2011-05-17 (×3): 25 ug via INTRAVENOUS

## 2011-05-17 NOTE — Progress Notes (Signed)
Patient has had many health problems over the last couple of years. She expressed how discouraged she sometimes feels over the limitations on her life, primarily being able to be independent. She also talked through some of her feelings about why she is having so many health problems ( issues with her feet and legs, aneurysms etc. And now some internal bleeding.) She cried while talking about her situation and indicated that she needed to do that as she has been trying to stay strong even though she was feeling down and vulnerable. She described the importance of her faith and the support of her church and has been thinking that she needs to work at letting go and trusting that God is with her. We talked about the balance of allowing oneself to have normal feelings of being discouraged and down when facing so much along with feeling that God is with her. 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  I offered a prayer for her based on what she identified as her desire for peace and patience.   05/17/11 1700  Clinical Encounter Type  Visited With Patient  Visit Type Spiritual support  Referral From Patient  Consult/Referral To None  Spiritual Encounters  Spiritual Needs Emotional;Prayer  Stress Factors  Patient Stress Factors Health changes  Family Stress Factors None identified  Advance Directives (For Healthcare)  Pre-existing out of facility DNR order (yellow form or pink MOST form) Form returned to patient/family  on discharge

## 2011-05-17 NOTE — Interval H&P Note (Signed)
History and Physical Interval Note:   05/17/2011   8:52 AM   Yolanda Yang  has presented today for surgery, with the diagnosis of GI bleed  The various methods of treatment have been discussed with the patient and family. After consideration of risks, benefits and other options for treatment, the patient has consented to  Procedure(s): ESOPHAGOGASTRODUODENOSCOPY (EGD) as a surgical intervention .  The patients' history has been reviewed, patient examined, no change in status, stable for surgery.  I have reviewed the patients' chart and labs.  Questions were answered to the patient's satisfaction.     Theda Belfast  MD

## 2011-05-17 NOTE — Progress Notes (Signed)
Subjective: No further melanotic stools. Just returned from endoscopy. No report available but patient endorses was told had multiple small ulcers. No other complaints verbalized. She is eating a biscuit from [employer].  Objective: Vital signs in last 24 hours: Temp:  [98 F (36.7 C)-98.6 F (37 C)] 98.6 F (37 C) (11/16 0833) Pulse Rate:  [73-98] 73  (11/16 0940) Resp:  [12-43] 20  (11/16 0910) BP: (86-137)/(39-77) 134/63 mmHg (11/16 1000) SpO2:  [99 %-100 %] 100 % (11/16 0910) Weight:  [80.9 kg (178 lb 5.6 oz)] 178 lb 5.6 oz (80.9 kg) (11/15 2358) Weight change: 1.4 kg (3 lb 1.4 oz) Last BM Date: 05/15/11  Intake/Output from previous day: 11/15 0701 - 11/16 0700 In: 1100 [I.V.:750; Blood:350] Out: 1600 [Urine:1600] Intake/Output this shift: Total I/O In: 480 [P.O.:300; I.V.:180] Out: 350 [Urine:350]  General appearance: alert, appears stated age and no distress Resp: clear to auscultation bilaterally Cardio: regular rate and rhythm, S1, S2 normal, no murmur, click, rub or gallop GI: soft, non-tender; bowel sounds normal; no masses,  no organomegaly Extremities: extremities normal, atraumatic, no cyanosis or edema Skin: Skin color, texture, turgor normal. No rashes or lesions Neurologic: Alert and oriented X 3, normal strength and tone. Lab Results:  [facility] 05/17/11 0700 05/16/11 2042  WBC 10.5 14.8*  HGB 9.0* 9.0*  HCT 27.0* 27.2*  PLT 235 238   BMET  Basename 05/17/11 0700 05/16/11 0428  NA 144 141  K 3.8 3.5  CL 113* 108  CO2 26 23  GLUCOSE 85 99  BUN 9 17  CREATININE 0.66 0.63  CALCIUM 8.3* 8.1*    Studies/Results: No results found.  Medications: I have reviewed the patient's current medications.  Assessment/Plan:  Acute GI bleeding/GERD/History of duodenal ulcer Appreciate GI/Dr. Elnoria Howard assistance-continue plan as per GI recommendations  Anemia associated with acute blood loss Hgb at presentation 9 now down to 7.1. Baseline between 10 and 11. Was  transfused 2 units PRBC's 11/15. Hgb remains stable post transfusion. We'll continue to follow the trend.  Hypotension due to blood loss RESOLVED.  Leukocytosis RESOLVED.  LOW BACK PAIN, CHRONIC Was on 2 NSAIDs and a steroid taper pre admit. Would not resume NSAID after dc.  Status post cerebral angioplasty with stent Per patient she has been adamantly instructed that she can't stop new Plavix or the stent will occlude. GI aware and recommends continue Plavix. Dr. Butler Denmark did speak with the interventional neuro-radiologist at [facility] and he recommended to continue the baby ASA in addition to the Plavix. We have made sure to do so and we'll follow the patient for any evidence of bleeding complications.  ASTHMA Not exacerbated  CYSTITIS, CHRONIC INTERSTITIAL No apparent change in baseline symptoms  Tobacco abuse  Disposition Consider transfer to a non-telemetry floor today 11/17 if hemoglobin remains stable.  LOS: 2 days   ELLIS,ALLISON L. 05/17/2011, 1:50 PM  I have personally examined this patient and reviewed the entire database. I have reviewed the above note, made any necessary editorial changes, and agree with its content.  Lonia Blood, MD [facility]

## 2011-05-17 NOTE — Progress Notes (Signed)
Pt reports feeling dizzy and having HA. MD was notified and a new order was placed.  Yohan Samons, Jeanie Sewer , RN

## 2011-05-18 LAB — CBC
HCT: 29 % — ABNORMAL LOW (ref 36.0–46.0)
MCH: 29.1 pg (ref 26.0–34.0)
MCH: 29.2 pg (ref 26.0–34.0)
MCHC: 32.8 g/dL (ref 30.0–36.0)
MCHC: 33.1 g/dL (ref 30.0–36.0)
Platelets: 273 10*3/uL (ref 150–400)
RBC: 3.01 MIL/uL — ABNORMAL LOW (ref 3.87–5.11)
RDW: 15.7 % — ABNORMAL HIGH (ref 11.5–15.5)
RDW: 15.5 % (ref 11.5–15.5)

## 2011-05-18 LAB — BASIC METABOLIC PANEL
CO2: 29 mEq/L (ref 19–32)
Calcium: 8.9 mg/dL (ref 8.4–10.5)
Chloride: 109 mEq/L (ref 96–112)
Creatinine, Ser: 0.65 mg/dL (ref 0.50–1.10)
Glucose, Bld: 78 mg/dL (ref 70–99)

## 2011-05-18 MED ORDER — FENTANYL 50 MCG/HR TD PT72: 100.0000 ug | MEDICATED_PATCH | TRANSDERMAL | Status: DC

## 2011-05-18 MED ORDER — OXYCODONE HCL 5 MG PO TABS
15.0000 mg | ORAL_TABLET | Freq: Four times a day (QID) | ORAL | Status: DC | PRN
  Administered 2011-05-18 – 2011-05-20 (×5): 15 mg via ORAL
  Filled 2011-05-18 (×5): qty 3

## 2011-05-18 MED ORDER — ZOLPIDEM TARTRATE 5 MG PO TABS
5.0000 mg | ORAL_TABLET | Freq: Every evening | ORAL | Status: DC | PRN
  Administered 2011-05-18 – 2011-05-19 (×2): 5 mg via ORAL
  Filled 2011-05-18 (×2): qty 1

## 2011-05-18 MED ORDER — FENTANYL 50 MCG/HR TD PT72
100.0000 ug | MEDICATED_PATCH | TRANSDERMAL | Status: DC
  Administered 2011-05-18: 100 ug via TRANSDERMAL
  Filled 2011-05-18 (×2): qty 1

## 2011-05-18 NOTE — Progress Notes (Signed)
Pt transferred from 2600 at 1533 A&O x3 from home and plans to return on discharge. Pt's skin is intact. [name] [facility]

## 2011-05-18 NOTE — Progress Notes (Signed)
Pt states "I just fell drained and I need a good night's rest". Dr. Blake Divine text paged to ask for PRN sleep aid. [name] [facility]

## 2011-05-18 NOTE — Progress Notes (Signed)
Subjective: She reports feeling "sick" in her stomach.  No vomiting, but positive for nausea.  She was able to tolerate PO yesterday.  Objective: Vital signs in last 24 hours: Temp:  [98.1 F (36.7 C)-98.7 F (37.1 C)] 98.1 F (36.7 C) (11/17 0430) Pulse Rate:  [73-80] 73  (11/16 0940) Resp:  [12-43] 14  (11/16 1955) BP: (113-137)/(56-77) 134/63 mmHg (11/16 1000) SpO2:  [99 %-100 %] 100 % (11/16 0910) Weight:  [85.6 kg (188 lb 11.4 oz)] 188 lb 11.4 oz (85.6 kg) (11/17 0008) Last BM Date: 05/15/11  Intake/Output from previous day: 11/16 0701 - 11/17 0700 In: 1810 [P.O.:1140; I.V.:570] Out: 1950 [Urine:1950] Intake/Output this shift: Total I/O In: 110 [I.V.:110] Out: 1000 [Urine:1000]  General appearance: alert and no distress Resp: clear to auscultation bilaterally Cardio: regular rate and rhythm, S1, S2 normal, no murmur, click, rub or gallop GI: soft, non-tender; bowel sounds normal; no masses,  no organomegaly Extremities: extremities normal, atraumatic, no cyanosis or edema  Lab Results:  [facility] 05/17/11 2051 05/17/11 0700 05/16/11 2042  WBC 13.3* 10.5 14.8*  HGB 8.9* 9.0* 9.0*  HCT 27.1* 27.0* 27.2*  PLT 255 235 238   BMET  Basename 05/17/11 0700 05/16/11 0428 05/15/11 1701  NA 144 141 139  K 3.8 3.5 4.0  CL 113* 108 103  CO2 26 23 25   GLUCOSE 85 99 101*  BUN 9 17 23   CREATININE 0.66 0.63 1.61  CALCIUM 8.3* 8.1* 9.8   LFT  Basename 05/16/11 0428  PROT 5.8*  ALBUMIN 3.3*  AST 16  ALT 22  ALKPHOS 46  BILITOT 0.2*  BILIDIR --  IBILI --   PT/INR  Basename 05/15/11 1719  LABPROT 13.2  INR 0.98   Hepatitis Panel No results found for this basename: HEPBSAG,HCVAB,HEPAIGM,HEPBIGM in the last 72 hours C-Diff No results found for this basename: CDIFFTOX:3 in the last 72 hours Fecal Lactopherrin No results found for this basename: FECLLACTOFRN in the last 72 hours  Studies/Results: Ct Head Wo Contrast  05/17/2011  *RADIOLOGY REPORT*  Clinical  Data: Right sided headache, dizziness, known aneurysm stent/coiling  CT HEAD WITHOUT CONTRAST  Technique:  Contiguous axial images were obtained from the base of the skull through the vertex without contrast.  Comparison: MRI brain dated 04/02/2011.  Findings: No evidence of parenchymal hemorrhage or extra-axial fluid collection. No mass lesion, mass effect, or midline shift.  No CT evidence of acute infarction.  Cerebral volume is age appropriate.  No ventriculomegaly.  Right internal carotid stents.  The visualized paranasal sinuses are essentially clear. The mastoid air cells are unopacified.  No evidence of calvarial fracture.  IMPRESSION: No evidence of acute intracranial abnormality.  Right internal carotid stents.  Original Report Authenticated By: Charline Bills, M.D.    Medications:  Scheduled:   . aspirin  81 mg Oral Daily  . clopidogrel  75 mg Oral Q breakfast  . dextrose      . fentaNYL  100 mcg Transdermal Q72H  . sucralfate  1 g Oral TID WC & HS  . tiotropium  18 mcg Inhalation Daily   Continuous:   . pantoprozole (PROTONIX) infusion 8 mg/hr (05/18/11 0600)    Assessment/Plan: 1) Multiple clean-based shallow gastric ulcers. 2) Cerebral stents needing to be on Plavix and ASA.  Plan: 1) PPI BID x 1 month and then QD for as long as she is on Plavix and ASA.  She is also to remain on sucralfate 1 gram QID x 1 month. 2) Continue with  Plavix and ASA as directed by physicians at [facility]. 3) Follow up with Dr. Loreta Ave in two weeks.  LOS: 3 days   Jess Sulak D 05/18/2011, 6:32 AM

## 2011-05-18 NOTE — Progress Notes (Signed)
Subjective: Slightly nauseas no vomiting or diarrhea. Sore belly. Objective: Vital signs in last 24 hours: Filed Vitals:   05/18/11 0800 05/18/11 0801 05/18/11 1154 05/18/11 1259  BP: 130/71 130/71  117/64  Pulse: 80     Temp:  97.5 F (36.4 C)  97.9 F (36.6 C)  TempSrc:  Oral  Oral  Resp: 17     Height:      Weight:      SpO2: 100%  100%    Weight change: 4.7 kg (10 lb 5.8 oz)  Intake/Output Summary (Last 24 hours) at 05/18/11 1339 Last data filed at 05/18/11 1000  Gross per 24 hour  Intake   1890 ml  Output   2300 ml  Net   -410 ml   Physical Exam:   General Appearance:    Alert, cooperative, no distress, appears stated age  Lungs:     Clear to auscultation bilaterally, respirations unlabored   Heart:    Regular rate and rhythm, S1 and S2 normal,  Abdomen:     Soft, mildly tender in the epigastric area, bowel sounds active all four quadrants,    no masses, no organomegaly  Extremities:   Extremities normal, atraumatic, no cyanosis or edema  Pulses:   2+ and symmetric all extremities  Skin:   Skin color, texture, turgor normal, no rashes or lesions  Neurologic:   CNII-XII intact, normal strength, sensation and reflexes    throughout     Results for orders placed during the hospital encounter of 05/15/11 (from the past 48 hour(s))  GLUCOSE, CAPILLARY     Status: Normal   Collection Time   05/16/11  4:10 PM      Component Value Range Comment   Glucose-Capillary 95  70 - 99 (mg/dL)    Comment 1 Documented in Chart      Comment 2 Notify RN     CBC     Status: Abnormal   Collection Time   05/16/11  4:42 PM      Component Value Range Comment   WBC 16.4 (*) 4.0 - 10.5 (K/uL)    RBC 3.39 (*) 3.87 - 5.11 (MIL/uL)    Hemoglobin 10.0 (*) 12.0 - 15.0 (g/dL) POST TRANSFUSION SPECIMEN   HCT 30.1 (*) 36.0 - 46.0 (%)    MCV 88.8  78.0 - 100.0 (fL)    MCH 29.5  26.0 - 34.0 (pg)    MCHC 33.2  30.0 - 36.0 (g/dL)    RDW 65.7 (*) 84.6 - 15.5 (%)    Platelets 243  150 -  400 (K/uL)   GLUCOSE, CAPILLARY     Status: Abnormal   Collection Time   05/16/11  8:41 PM      Component Value Range Comment   Glucose-Capillary 103 (*) 70 - 99 (mg/dL)    Comment 1 Documented in Chart      Comment 2 Notify RN     CBC     Status: Abnormal   Collection Time   05/16/11  8:42 PM      Component Value Range Comment   WBC 14.8 (*) 4.0 - 10.5 (K/uL)    RBC 3.09 (*) 3.87 - 5.11 (MIL/uL)    Hemoglobin 9.0 (*) 12.0 - 15.0 (g/dL)    HCT 96.2 (*) 95.2 - 46.0 (%)    MCV 88.0  78.0 - 100.0 (fL)    MCH 29.1  26.0 - 34.0 (pg)    MCHC 33.1  30.0 - 36.0 (g/dL)  RDW 16.0 (*) 11.5 - 15.5 (%)    Platelets 238  150 - 400 (K/uL)   GLUCOSE, CAPILLARY     Status: Abnormal   Collection Time   05/16/11 11:55 PM      Component Value Range Comment   Glucose-Capillary 126 (*) 70 - 99 (mg/dL)    Comment 1 Documented in Chart      Comment 2 Notify RN     GLUCOSE, CAPILLARY     Status: Normal   Collection Time   05/17/11  4:07 AM      Component Value Range Comment   Glucose-Capillary 88  70 - 99 (mg/dL)    Comment 1 Documented in Chart      Comment 2 Notify RN     CBC     Status: Abnormal   Collection Time   05/17/11  7:00 AM      Component Value Range Comment   WBC 10.5  4.0 - 10.5 (K/uL)    RBC 3.05 (*) 3.87 - 5.11 (MIL/uL)    Hemoglobin 9.0 (*) 12.0 - 15.0 (g/dL)    HCT 16.1 (*) 09.6 - 46.0 (%)    MCV 88.5  78.0 - 100.0 (fL)    MCH 29.5  26.0 - 34.0 (pg)    MCHC 33.3  30.0 - 36.0 (g/dL)    RDW 04.5 (*) 40.9 - 15.5 (%)    Platelets 235  150 - 400 (K/uL)   BASIC METABOLIC PANEL     Status: Abnormal   Collection Time   05/17/11  7:00 AM      Component Value Range Comment   Sodium 144  135 - 145 (mEq/L)    Potassium 3.8  3.5 - 5.1 (mEq/L)    Chloride 113 (*) 96 - 112 (mEq/L)    CO2 26  19 - 32 (mEq/L)    Glucose, Bld 85  70 - 99 (mg/dL)    BUN 9  6 - 23 (mg/dL)    Creatinine, Ser 8.11  0.50 - 1.10 (mg/dL)    Calcium 8.3 (*) 8.4 - 10.5 (mg/dL)    GFR calc non Af Amer >90   >90 (mL/min)    GFR calc Af Amer >90  >90 (mL/min)   GLUCOSE, CAPILLARY     Status: Abnormal   Collection Time   05/17/11  8:03 AM      Component Value Range Comment   Glucose-Capillary 69 (*) 70 - 99 (mg/dL)    Comment 1 Documented in Chart      Comment 2 Notify RN     GLUCOSE, CAPILLARY     Status: Abnormal   Collection Time   05/17/11  8:18 AM      Component Value Range Comment   Glucose-Capillary 177 (*) 70 - 99 (mg/dL)    Comment 1 STAT Lab      Comment 2 Notify RN     GLUCOSE, CAPILLARY     Status: Normal   Collection Time   05/17/11 12:12 PM      Component Value Range Comment   Glucose-Capillary 81  70 - 99 (mg/dL)    Comment 1 Documented in Chart      Comment 2 Notify RN     GLUCOSE, CAPILLARY     Status: Normal   Collection Time   05/17/11  5:25 PM      Component Value Range Comment   Glucose-Capillary 98  70 - 99 (mg/dL)    Comment 1 Documented in Chart  Comment 2 Notify RN     CBC     Status: Abnormal   Collection Time   05/17/11  8:51 PM      Component Value Range Comment   WBC 13.3 (*) 4.0 - 10.5 (K/uL)    RBC 3.07 (*) 3.87 - 5.11 (MIL/uL)    Hemoglobin 8.9 (*) 12.0 - 15.0 (g/dL)    HCT 91.4 (*) 78.2 - 46.0 (%)    MCV 88.3  78.0 - 100.0 (fL)    MCH 29.0  26.0 - 34.0 (pg)    MCHC 32.8  30.0 - 36.0 (g/dL)    RDW 95.6 (*) 21.3 - 15.5 (%)    Platelets 255  150 - 400 (K/uL)   BASIC METABOLIC PANEL     Status: Normal   Collection Time   05/18/11  7:55 AM      Component Value Range Comment   Sodium 145  135 - 145 (mEq/L)    Potassium 3.7  3.5 - 5.1 (mEq/L)    Chloride 109  96 - 112 (mEq/L)    CO2 29  19 - 32 (mEq/L)    Glucose, Bld 78  70 - 99 (mg/dL)    BUN 9  6 - 23 (mg/dL)    Creatinine, Ser 0.86  0.50 - 1.10 (mg/dL)    Calcium 8.9  8.4 - 10.5 (mg/dL)    GFR calc non Af Amer >90  >90 (mL/min)    GFR calc Af Amer >90  >90 (mL/min)   CBC     Status: Abnormal   Collection Time   05/18/11 12:10 PM      Component Value Range Comment   WBC 12.3 (*)  4.0 - 10.5 (K/uL)    RBC 3.26 (*) 3.87 - 5.11 (MIL/uL)    Hemoglobin 9.5 (*) 12.0 - 15.0 (g/dL)    HCT 57.8 (*) 46.9 - 46.0 (%)    MCV 89.0  78.0 - 100.0 (fL)    MCH 29.1  26.0 - 34.0 (pg)    MCHC 32.8  30.0 - 36.0 (g/dL)    RDW 62.9 (*) 52.8 - 15.5 (%)    Platelets 269  150 - 400 (K/uL)     Micro Results: Recent Results (from the past 240 hour(s))  MRSA PCR SCREENING     Status: Normal   Collection Time   05/15/11 11:00 PM      Component Value Range Status Comment   MRSA by PCR NEGATIVE  NEGATIVE  Final    Studies/Results: Ct Head Wo Contrast  05/17/2011  *RADIOLOGY REPORT*  Clinical Data: Right sided headache, dizziness, known aneurysm stent/coiling  CT HEAD WITHOUT CONTRAST  Technique:  Contiguous axial images were obtained from the base of the skull through the vertex without contrast.  Comparison: MRI brain dated 04/02/2011.  Findings: No evidence of parenchymal hemorrhage or extra-axial fluid collection. No mass lesion, mass effect, or midline shift.  No CT evidence of acute infarction.  Cerebral volume is age appropriate.  No ventriculomegaly.  Right internal carotid stents.  The visualized paranasal sinuses are essentially clear. The mastoid air cells are unopacified.  No evidence of calvarial fracture.  IMPRESSION: No evidence of acute intracranial abnormality.  Right internal carotid stents.  Original Report Authenticated By: Charline Bills, M.D.   Medications: reviewed.  Scheduled Meds:   . aspirin  81 mg Oral Daily  . clopidogrel  75 mg Oral Q breakfast  . fentaNYL  100 mcg Transdermal Q72H  . sucralfate  1 g Oral  TID WC & HS  . tiotropium  18 mcg Inhalation Daily   Continuous Infusions:   . pantoprozole (PROTONIX) infusion 8 mg/hr (05/18/11 0900)   PRN Meds:.acetaminophen, acetaminophen, diphenhydrAMINE, HYDROmorphone, ondansetron (ZOFRAN) IV, ondansetron Assessment/Plan: Principal Problem: Acute GI bleeding/GERD/History of duodenal ulcer  on iv ppi, H&H  stable. Will transfer pt to telemetry. Change to oral ppi in am. contue with q12hrs H&H.  Leukocytosis and Hypotension : resolved.  Cerebral aneurysm s/p angioplasty on asprin and plavix , continue the same.  Disposition: possible d/c in am.    LOS: 3 days   Darleny Sem 05/18/2011, 1:39 PM

## 2011-05-19 LAB — CBC
HCT: 27.9 % — ABNORMAL LOW (ref 36.0–46.0)
Platelets: 294 10*3/uL (ref 150–400)
RDW: 15.7 % — ABNORMAL HIGH (ref 11.5–15.5)
WBC: 12.9 10*3/uL — ABNORMAL HIGH (ref 4.0–10.5)

## 2011-05-19 LAB — TYPE AND SCREEN: Unit division: 0

## 2011-05-19 MED ORDER — PANTOPRAZOLE SODIUM 40 MG PO TBEC
40.0000 mg | DELAYED_RELEASE_TABLET | Freq: Two times a day (BID) | ORAL | Status: DC
  Administered 2011-05-20 (×2): 40 mg via ORAL
  Filled 2011-05-19 (×2): qty 1

## 2011-05-19 MED ORDER — CYCLOBENZAPRINE HCL 5 MG PO TABS
5.0000 mg | ORAL_TABLET | Freq: Once | ORAL | Status: AC
  Administered 2011-05-19: 5 mg via ORAL
  Filled 2011-05-19: qty 1

## 2011-05-19 MED ORDER — CYCLOBENZAPRINE HCL 10 MG PO TABS
5.0000 mg | ORAL_TABLET | Freq: Three times a day (TID) | ORAL | Status: DC | PRN
  Administered 2011-05-19 – 2011-05-20 (×2): 5 mg via ORAL
  Filled 2011-05-19: qty 1
  Filled 2011-05-19: qty 0.5

## 2011-05-19 NOTE — Progress Notes (Signed)
Pt. Began to complain of spasms bilaterally in her thighs. Dr. Blake Divine was paged, and reported she would enter the order in roughly 15 minutes.

## 2011-05-19 NOTE — Progress Notes (Signed)
Subjective: Patient states her pain is better but she'll [place] shows denies any vomiting complains of back spasms, patient states she usually takes Flexeril at home, will restart her flexeril.  Objective: Vital signs in last 24 hours: Filed Vitals:   05/18/11 2111 05/19/11 0503 05/19/11 0858 05/19/11 1500  BP: 112/69 98/61  142/74  Pulse: 102 92  96  Temp: 98.8 F (37.1 C) 98.1 F (36.7 C)  97.6 F (36.4 C)  TempSrc:      Resp: 20 19  19   Height:      Weight:      SpO2: 100% 66% 94% 100%   Weight change:   Intake/Output Summary (Last 24 hours) at 05/19/11 1806 Last data filed at 05/19/11 0600  Gross per 24 hour  Intake    615 ml  Output      0 ml  Net    615 ml   Physical Exam:   General Appearance:    Alert, cooperative, no distress, appears stated age  Lungs:     Clear to auscultation bilaterally, respirations unlabored   Heart:    Regular rate and rhythm, S1 and S2 normal, no murmur, rub   or gallop  Abdomen:     Soft, non-tender, bowel sounds active all four quadrants,    no masses, no organomegaly  Extremities:   Extremities normal, atraumatic, no cyanosis or edema  Pulses:   2+ and symmetric all extremities  Skin:   Skin color, texture, turgor normal, no rashes or lesions  Neurologic:   CNII-XII intact, normal strength, sensation and reflexes    throughout     Lab Results: Results for orders placed during the hospital encounter of 05/15/11 (from the past 24 hour(s))  CBC     Status: Abnormal   Collection Time   05/18/11  8:30 PM      Component Value Range   WBC 16.7 (*) 4.0 - 10.5 (K/uL)   RBC 3.01 (*) 3.87 - 5.11 (MIL/uL)   Hemoglobin 8.8 (*) 12.0 - 15.0 (g/dL)   HCT 16.1 (*) 09.6 - 46.0 (%)   MCV 88.4  78.0 - 100.0 (fL)   MCH 29.2  26.0 - 34.0 (pg)   MCHC 33.1  30.0 - 36.0 (g/dL)   RDW 04.5  40.9 - 81.1 (%)   Platelets 273  150 - 400 (K/uL)  CBC     Status: Abnormal   Collection Time   05/19/11  3:40 PM      Component Value Range   WBC 12.9  (*) 4.0 - 10.5 (K/uL)   RBC 3.10 (*) 3.87 - 5.11 (MIL/uL)   Hemoglobin 9.1 (*) 12.0 - 15.0 (g/dL)   HCT 91.4 (*) 78.2 - 46.0 (%)   MCV 90.0  78.0 - 100.0 (fL)   MCH 29.4  26.0 - 34.0 (pg)   MCHC 32.6  30.0 - 36.0 (g/dL)   RDW 95.6 (*) 21.3 - 15.5 (%)   Platelets 294  150 - 400 (K/uL)     Micro Results: Recent Results (from the past 240 hour(s))  MRSA PCR SCREENING     Status: Normal   Collection Time   05/15/11 11:00 PM      Component Value Range Status Comment   MRSA by PCR NEGATIVE  NEGATIVE  Final    Studies/Results: Ct Head Wo Contrast  05/17/2011  *RADIOLOGY REPORT*  Clinical Data: Right sided headache, dizziness, known aneurysm stent/coiling  CT HEAD WITHOUT CONTRAST  Technique:  Contiguous axial images were obtained from  the base of the skull through the vertex without contrast.  Comparison: MRI brain dated 04/02/2011.  Findings: No evidence of parenchymal hemorrhage or extra-axial fluid collection. No mass lesion, mass effect, or midline shift.  No CT evidence of acute infarction.  Cerebral volume is age appropriate.  No ventriculomegaly.  Right internal carotid stents.  The visualized paranasal sinuses are essentially clear. The mastoid air cells are unopacified.  No evidence of calvarial fracture.  IMPRESSION: No evidence of acute intracranial abnormality.  Right internal carotid stents.  Original Report Authenticated By: Charline Bills, M.D.   Medications: reviewed.  Scheduled Meds:   . aspirin  81 mg Oral Daily  . clopidogrel  75 mg Oral Q breakfast  . cyclobenzaprine  5 mg Oral Once  . fentaNYL  100 mcg Transdermal Q48H  . sucralfate  1 g Oral TID WC & HS  . tiotropium  18 mcg Inhalation Daily  . DISCONTD: fentaNYL  100 mcg Transdermal Q72H  . DISCONTD: fentaNYL  100 mcg Transdermal Q72H   Continuous Infusions:   . pantoprozole (PROTONIX) infusion 8 mg/hr (05/19/11 0936)   PRN Meds:.acetaminophen, acetaminophen, cyclobenzaprine, diphenhydrAMINE, ondansetron  (ZOFRAN) IV, ondansetron, oxyCODONE, zolpidem, DISCONTD: HYDROmorphone Assessment/Plan: Acute GI bleeding/GERD/History of duodenal ulcer  Change iv pi to po , H&H stable.   Leukocytosis and Hypotension : resolved.  Cerebral aneurysm s/p angioplasty on asprin and plavix , continue the same.  Disposition: possible d/c in am.    LOS: 4 days   Kilo Eshelman 05/19/2011, 6:06 PM

## 2011-05-19 NOTE — Plan of Care (Signed)
Problem: Discharge Progression Outcomes Goal: Pain controlled with appropriate interventions Outcome: Progressing Pt. States that her pain is much better than yesterday, and the oral analgesics are keeping her chronic pain at a manageable level.

## 2011-05-20 MED ORDER — PANTOPRAZOLE SODIUM 40 MG PO TBEC: 40.0000 mg | DELAYED_RELEASE_TABLET | Freq: Two times a day (BID) | ORAL | Status: DC

## 2011-05-20 MED ORDER — DOCUSATE SODIUM 50 MG/5ML PO LIQD: 200.0000 mg | Freq: Every day | ORAL | Status: AC | PRN

## 2011-05-20 MED ORDER — POLYETHYLENE GLYCOL 3350 17 G PO PACK
17.0000 g | PACK | Freq: Every day | ORAL | Status: DC
  Administered 2011-05-20: 17 g via ORAL
  Filled 2011-05-20: qty 1

## 2011-05-20 MED ORDER — SUCRALFATE 1 G PO TABS: 1.0000 g | ORAL_TABLET | Freq: Three times a day (TID) | ORAL | Status: DC

## 2011-05-20 MED ORDER — PANTOPRAZOLE SODIUM 40 MG PO TBEC: 40.0000 mg | DELAYED_RELEASE_TABLET | Freq: Every day | ORAL | Status: DC

## 2011-05-20 MED ORDER — ONDANSETRON HCL 4 MG PO TABS: 4.0000 mg | ORAL_TABLET | Freq: Four times a day (QID) | ORAL | Status: AC | PRN

## 2011-05-20 MED ORDER — DOCUSATE SODIUM 50 MG/5ML PO LIQD
200.0000 mg | Freq: Every day | ORAL | Status: DC
  Administered 2011-05-20: 200 mg via ORAL
  Filled 2011-05-20 (×2): qty 20

## 2011-05-20 MED ORDER — POLYETHYLENE GLYCOL 3350 17 G PO PACK: 17.0000 g | PACK | Freq: Every day | ORAL | Status: AC | PRN

## 2011-05-20 NOTE — Progress Notes (Signed)
[name] to be D/C'd Home per MD order.  Discussed prescriptions and follow up appointments with the patient. Prescriptions given to patient, medication list explained in detail. Pt verbalized understanding.   Yolanda Yang, Yolanda Yang  Home Medication Instructions FAO:[id number]   Printed on:05/20/11 1758  Medication Information                    fentaNYL (DURAGESIC - DOSED MCG/HR) 100 MCG/HR Place 1 patch onto the skin every other day. Sig states every 2 days            DULoxetine (CYMBALTA) 60 MG capsule Take 60 mg by mouth daily.             rosuvastatin (CRESTOR) 10 MG tablet Take 10 mg by mouth daily.             oxyCODONE (ROXICODONE) 15 MG immediate release tablet Take 15 mg by mouth daily as needed. For pain for 30 days             cyclobenzaprine (FLEXERIL) 10 MG tablet Take 10 mg by mouth 3 (three) times daily.             tiotropium (SPIRIVA) 18 MCG inhalation capsule Place 18 mcg into inhaler and inhale daily.             oxymetazoline (AFRIN) 0.05 % nasal spray Place 2 sprays into the nose 2 (two) times daily.             aspirin 81 MG chewable tablet Chew 81 mg by mouth daily.             clopidogrel (PLAVIX) 75 MG tablet Take 75 mg by mouth daily.             gabapentin (NEURONTIN) 300 MG capsule Take 300 mg by mouth 3 (three) times daily.             butalbital-acetaminophen-caffeine (FIORICET, ESGIC) 50-325-40 MG per tablet Take 1 tablet by mouth daily as needed. For headaches             pentosan polysulfate (ELMIRON) 100 MG capsule Take 200 mg by mouth 2 (two) times daily.             docusate (COLACE) 50 MG/5ML liquid Take 20 mLs (200 mg total) by mouth daily as needed.           ondansetron (ZOFRAN) 4 MG tablet Take 1 tablet (4 mg total) by mouth every 6 (six) hours as needed for nausea.           pantoprazole (PROTONIX) 40 MG tablet Take 1 tablet (40 mg total) by mouth 2 (two) times daily before a meal.           polyethylene glycol  (MIRALAX / GLYCOLAX) packet Take 17 g by mouth daily as needed.           sucralfate (CARAFATE) 1 G tablet Take 1 tablet (1 g total) by mouth 4 (four) times daily -  with meals and at bedtime.             Filed Vitals:   05/20/11 1400  BP: 118/68  Pulse: 97  Temp: 98.2 F (36.8 C)  Resp: 18    Skin clean, dry and intact without evidence of skin break down, no evidence of skin tears noted. IV catheter discontinued intact. Site without signs and symptoms of complications. Dressing and pressure applied. Pt denies pain at this time. No complaints noted.  An After Visit Summary was printed and given to the patient. Patient escorted via WC, and D/C home via private auto.  Driggers, Rae Roam 05/20/2011 5:58 PM

## 2011-05-20 NOTE — Discharge Summary (Signed)
PATIENT DETAILS Name: Yolanda Yang Age: 55 y.o. Sex: female Date of Birth: 11-26-1955 MRN: [id number]. Admit Date: 05/15/2011 Admitting Physician: Kathlen Mody WUJ:WJXB,JYNWGN K, MD  PRIMARY DISCHARGE DIAGNOSIS:  Principal Problem:  *Acute GI bleeding: Secondary to superficial gastric ulcers.  Active Problems:  ASTHMA  GERD    LOW BACK PAIN, CHRONIC  History of duodenal ulcer  Anemia associated with acute blood loss  Status post  Cerebral angioplasty with stent   Tobacco abuse  Leukocytosis persistent.       PAST MEDICAL HISTORY: Past Medical History  Diagnosis Date  . Hypertension   . Hyperlipemia   . Shortness of breath   . Blood transfusion   . Headache   . GERD (gastroesophageal reflux disease)   . Anemia   . Asthma     DISCHARGE MEDICATIONS: Current Discharge Medication List    START taking these medications   Details  docusate (COLACE) 50 MG/5ML liquid Take 20 mLs (200 mg total) by mouth daily as needed. Qty: 100 mL, Refills: 0    ondansetron (ZOFRAN) 4 MG tablet Take 1 tablet (4 mg total) by mouth every 6 (six) hours as needed for nausea. Qty: 20 tablet, Refills: 0    pantoprazole (PROTONIX) 40 MG tablet Take 1 tablet (40 mg total) by mouth 2 (two) times daily before a meal. Qty: 30 tablet, Refills: 0    polyethylene glycol (MIRALAX / GLYCOLAX) packet Take 17 g by mouth daily as needed. Qty: 14 each, Refills: 0    sucralfate (CARAFATE) 1 G tablet Take 1 tablet (1 g total) by mouth 4 (four) times daily -  with meals and at bedtime. Qty: 30 tablet, Refills: 0      CONTINUE these medications which have NOT CHANGED   Details  aspirin 81 MG chewable tablet Chew 81 mg by mouth daily.      butalbital-acetaminophen-caffeine (FIORICET, ESGIC) 50-325-40 MG per tablet Take 1 tablet by mouth daily as needed. For headaches      clopidogrel (PLAVIX) 75 MG tablet Take 75 mg by mouth daily.      cyclobenzaprine (FLEXERIL) 10 MG tablet Take 10 mg by mouth  3 (three) times daily.      DULoxetine (CYMBALTA) 60 MG capsule Take 60 mg by mouth daily.      fentaNYL (DURAGESIC - DOSED MCG/HR) 100 MCG/HR Place 1 patch onto the skin every other day. Sig states every 2 days     gabapentin (NEURONTIN) 300 MG capsule Take 300 mg by mouth 3 (three) times daily.      oxyCODONE (ROXICODONE) 15 MG immediate release tablet Take 15 mg by mouth daily as needed. For pain for 30 days      oxymetazoline (AFRIN) 0.05 % nasal spray Place 2 sprays into the nose 2 (two) times daily.      pentosan polysulfate (ELMIRON) 100 MG capsule Take 200 mg by mouth 2 (two) times daily.      rosuvastatin (CRESTOR) 10 MG tablet Take 10 mg by mouth daily.      tiotropium (SPIRIVA) 18 MCG inhalation capsule Place 18 mcg into inhaler and inhale daily.        STOP taking these medications     dexamethasone (DECADRON) 4 MG tablet      ibuprofen (ADVIL,MOTRIN) 800 MG tablet      meloxicam (MOBIC) 15 MG tablet      ranitidine (ZANTAC) 300 MG tablet          BRIEF HPI:  See H&P, Labs,  Consult and Test reports for all details in brief, patient was admitted for black stools and guiac positive stools.   CONSULTATIONS:  Gi consult from Dr Elnoria Howard.    PERTINENT RADIOLOGIC STUDIES: Ct Head Wo Contrast  05/17/2011  *RADIOLOGY REPORT*  Clinical Data: Right sided headache, dizziness, known aneurysm stent/coiling  CT HEAD WITHOUT CONTRAST  Technique:  Contiguous axial images were obtained from the base of the skull through the vertex without contrast.  Comparison: MRI brain dated 04/02/2011.  Findings: No evidence of parenchymal hemorrhage or extra-axial fluid collection. No mass lesion, mass effect, or midline shift.  No CT evidence of acute infarction.  Cerebral volume is age appropriate.  No ventriculomegaly.  Right internal carotid stents.  The visualized paranasal sinuses are essentially clear. The mastoid air cells are unopacified.  No evidence of calvarial fracture.   IMPRESSION: No evidence of acute intracranial abnormality.  Right internal carotid stents.  Original Report Authenticated By: Charline Bills, M.D.     PERTINENT LAB RESULTS: CBC:  Basename 05/19/11 1540 05/18/11 2030  WBC 12.9* 16.7*  HGB 9.1* 8.8*  HCT 27.9* 26.6*  PLT 294 273   CMET CMP     Component Value Date/Time   NA 145 05/18/2011 0755   K 3.7 05/18/2011 0755   CL 109 05/18/2011 0755   CO2 29 05/18/2011 0755   GLUCOSE 78 05/18/2011 0755   BUN 9 05/18/2011 0755   CREATININE 0.65 05/18/2011 0755   CALCIUM 8.9 05/18/2011 0755   PROT 5.8* 05/16/2011 0428   ALBUMIN 3.3* 05/16/2011 0428   AST 16 05/16/2011 0428   ALT 22 05/16/2011 0428   ALKPHOS 46 05/16/2011 0428   BILITOT 0.2* 05/16/2011 0428   GFRNONAA >90 05/18/2011 0755   GFRAA >90 05/18/2011 0755    GFR Estimated Creatinine Clearance: 87.8 ml/min (by C-G formula based on Cr of 0.65). No results found for this basename: LIPASE:2,AMYLASE:2 in the last 72 hours No results found for this basename: CKTOTAL:3,CKMB:3,CKMBINDEX:3,TROPONINI:3 in the last 72 hours No results found for this basename: POCBNP:3 in the last 72 hours No results found for this basename: DDIMER:2 in the last 72 hours No results found for this basename: HGBA1C:2 in the last 72 hours No results found for this basename: CHOL:2,HDL:2,LDLCALC:2,TRIG:2,CHOLHDL:2,LDLDIRECT:2 in the last 72 hours No results found for this basename: TSH,T4TOTAL,FREET3,T3FREE,THYROIDAB in the last 72 hours No results found for this basename: VITAMINB12:2,FOLATE:2,FERRITIN:2,TIBC:2,IRON:2,RETICCTPCT:2 in the last 72 hours Coags: No results found for this basename: PT:2,INR:2 in the last 72 hours Microbiology: Recent Results (from the past 240 hour(s))  MRSA PCR SCREENING     Status: Normal   Collection Time   05/15/11 11:00 PM      Component Value Range Status Comment   MRSA by PCR NEGATIVE  NEGATIVE  Final    BRIEF HOSPITAL COURSE:    55 year old lady with  history of prior GI bleed secondary to duodenal ulcer was admitted for black stools and a drop in drop in his hemoglobin. She was admitted to hospitalist service for further evaluation and management of GI bleed. Gastroenterology consult was obtained from Dr. Nicolasa Ducking, patient underwent EGD was found to have multiple superficial gastric ulcers. She was started on Carafate twice a day proton next. Her hemoglobin has been stable at 9. She is being discharged her on with the twice a day dosing of proton next Carafate and advised to stop and rest legs. She will continue to do to take aspirin and Plavix for several angioplasty. She is advised to take  proton aches or any ppi as long as she is on aspirin and Plavix. She is recommended to follow with Dr. him Loreta Ave in about 2 weeks.  Constipation patient had 1 bowel movement this morning she she was started on Colace and MiraLAX.  Chronic back pain currently on fentanyl patch and oxycodone continue the same.  Cerebral  aneurysm status post angioplasty with stent on aspirin 81 mg and Plavix of 75 mg.  Anemia secondary to GI bleed hemoglobin stable at 9 recommended to get a repeat CBC at PCP's office in about one week.  Hypotension resolved  Leukocytosis persist and most likely secondary to steroids taken  prior to admission  TODAY-DAY OF DISCHARGE:  Subjective:   Kenya Kook today has no headache,no chest abdominal pain,no new weakness tingling or numbness, feels much better wants to go home today.   Objective:   Blood pressure 118/68, pulse 97, temperature 98.2 F (36.8 C), temperature source Oral, resp. rate 18, height 5\' 5"  (1.651 m), weight 85.6 kg (188 lb 11.4 oz), SpO2 97.00%.  Intake/Output Summary (Last 24 hours) at 05/20/11 1631 Last data filed at 05/20/11 1300  Gross per 24 hour  Intake 839.17 ml  Output      0 ml  Net 839.17 ml    Exam Awake Alert, Oriented *3, No new F.N deficits, Normal affect [place].AT,PERRAL Supple Neck,No JVD, No  cervical lymphadenopathy appriciated.  Symmetrical Chest wall movement, Good air movement bilaterally, CTAB RRR,No Gallops,Rubs or new Murmurs, No Parasternal Heave +ve B.Sounds, Abd Soft, Non tender, No organomegaly appriciated, No rebound -guarding or rigidity. No Cyanosis, Clubbing or edema, No new Rash or bruise  DISPOSITION:   DISCHARGE INSTRUCTIONS:    Follow-up Information    Follow up with HILL,GERALD K in 1 week.   Contact information:   [address] [place] [address] [phone]       Follow up with Charna Elizabeth, MD in 2 weeks.   Contact information:   [address], [place] [address] [phone]           Total Time spent on discharge equals 45 minutes.  SignedKathlen Mody 05/20/2011 4:31 PM

## 2011-05-27 ENCOUNTER — Encounter (HOSPITAL_COMMUNITY): Payer: Self-pay | Admitting: Gastroenterology

## 2011-07-03 ENCOUNTER — Other Ambulatory Visit: Payer: Self-pay | Admitting: *Deleted

## 2011-07-03 DIAGNOSIS — M4802 Spinal stenosis, cervical region: Secondary | ICD-10-CM

## 2011-07-03 DIAGNOSIS — M48061 Spinal stenosis, lumbar region without neurogenic claudication: Secondary | ICD-10-CM

## 2011-07-10 ENCOUNTER — Ambulatory Visit
Admission: RE | Admit: 2011-07-10 | Discharge: 2011-07-10 | Disposition: A | Discharge status: Home / Self Care | Payer: Medicare Other | Source: Ambulatory Visit | Attending: *Deleted | Admitting: *Deleted

## 2011-07-10 DIAGNOSIS — M48061 Spinal stenosis, lumbar region without neurogenic claudication: Secondary | ICD-10-CM

## 2011-07-10 DIAGNOSIS — M4802 Spinal stenosis, cervical region: Secondary | ICD-10-CM

## 2011-07-17 ENCOUNTER — Ambulatory Visit
Admission: RE | Admit: 2011-07-17 | Discharge: 2011-07-17 | Disposition: A | Discharge status: Home / Self Care | Payer: Medicare Other | Source: Ambulatory Visit | Attending: *Deleted | Admitting: *Deleted

## 2011-09-12 DIAGNOSIS — M87059 Idiopathic aseptic necrosis of unspecified femur: Secondary | ICD-10-CM | POA: Insufficient documentation

## 2011-09-12 DIAGNOSIS — M1612 Unilateral primary osteoarthritis, left hip: Secondary | ICD-10-CM | POA: Insufficient documentation

## 2012-01-01 DIAGNOSIS — Z96649 Presence of unspecified artificial hip joint: Secondary | ICD-10-CM | POA: Insufficient documentation

## 2012-01-01 DIAGNOSIS — Z471 Aftercare following joint replacement surgery: Secondary | ICD-10-CM | POA: Insufficient documentation

## 2012-02-03 ENCOUNTER — Ambulatory Visit: Payer: Medicare PPO | Admitting: Physical Therapy

## 2012-02-06 ENCOUNTER — Ambulatory Visit: Payer: Medicare Other

## 2012-04-08 ENCOUNTER — Other Ambulatory Visit: Payer: Self-pay | Admitting: Obstetrics and Gynecology

## 2012-07-01 HISTORY — PX: ROTATOR CUFF REPAIR: SHX139

## 2012-07-02 ENCOUNTER — Ambulatory Visit: Payer: Medicare Other | Admitting: Rehabilitation

## 2012-07-09 ENCOUNTER — Ambulatory Visit: Payer: Medicare PPO | Attending: Adult Reconstructive Orthopaedic Surgery | Admitting: Physical Therapy

## 2012-07-09 DIAGNOSIS — M545 Low back pain, unspecified: Secondary | ICD-10-CM | POA: Insufficient documentation

## 2012-07-09 DIAGNOSIS — R293 Abnormal posture: Secondary | ICD-10-CM | POA: Insufficient documentation

## 2012-07-09 DIAGNOSIS — R269 Unspecified abnormalities of gait and mobility: Secondary | ICD-10-CM | POA: Insufficient documentation

## 2012-07-09 DIAGNOSIS — IMO0001 Reserved for inherently not codable concepts without codable children: Secondary | ICD-10-CM | POA: Insufficient documentation

## 2012-07-09 DIAGNOSIS — M6281 Muscle weakness (generalized): Secondary | ICD-10-CM | POA: Insufficient documentation

## 2012-07-15 ENCOUNTER — Ambulatory Visit: Payer: Medicare PPO | Admitting: Physical Therapy

## 2012-07-16 ENCOUNTER — Ambulatory Visit: Payer: Medicare PPO | Admitting: Physical Therapy

## 2012-07-22 ENCOUNTER — Ambulatory Visit: Payer: Medicare PPO | Admitting: Rehabilitation

## 2012-07-29 ENCOUNTER — Ambulatory Visit: Payer: Medicare PPO | Admitting: Rehabilitation

## 2012-07-30 ENCOUNTER — Ambulatory Visit: Payer: Medicare PPO | Admitting: Rehabilitation

## 2012-08-05 ENCOUNTER — Ambulatory Visit: Payer: Medicare PPO | Attending: Family Medicine | Admitting: Rehabilitation

## 2012-08-05 DIAGNOSIS — R293 Abnormal posture: Secondary | ICD-10-CM | POA: Insufficient documentation

## 2012-08-05 DIAGNOSIS — M545 Low back pain, unspecified: Secondary | ICD-10-CM | POA: Insufficient documentation

## 2012-08-05 DIAGNOSIS — R269 Unspecified abnormalities of gait and mobility: Secondary | ICD-10-CM | POA: Insufficient documentation

## 2012-08-05 DIAGNOSIS — IMO0001 Reserved for inherently not codable concepts without codable children: Secondary | ICD-10-CM | POA: Insufficient documentation

## 2012-08-05 DIAGNOSIS — M6281 Muscle weakness (generalized): Secondary | ICD-10-CM | POA: Insufficient documentation

## 2012-08-06 ENCOUNTER — Ambulatory Visit: Payer: Medicare PPO | Admitting: Rehabilitation

## 2012-08-12 ENCOUNTER — Ambulatory Visit: Payer: Medicare PPO | Admitting: Rehabilitation

## 2012-08-13 ENCOUNTER — Ambulatory Visit: Payer: Medicare PPO | Admitting: Rehabilitation

## 2012-08-19 ENCOUNTER — Encounter: Payer: Medicare Other | Admitting: Rehabilitation

## 2012-08-20 ENCOUNTER — Ambulatory Visit: Payer: Medicare PPO | Admitting: Rehabilitation

## 2012-08-27 ENCOUNTER — Ambulatory Visit: Payer: Medicare PPO | Admitting: Physical Therapy

## 2012-09-02 ENCOUNTER — Encounter: Payer: Medicare Other | Admitting: Rehabilitation

## 2012-09-23 DIAGNOSIS — S43429A Sprain of unspecified rotator cuff capsule, initial encounter: Secondary | ICD-10-CM | POA: Insufficient documentation

## 2012-10-02 IMAGING — CT CT HEAD W/O CM
1 of 2 series · 13 of 30 positions shown, 17 images · non-contrast
Comparison: MRI brain dated 04/02/2011.

CLINICAL DATA: Right sided headache, dizziness, known aneurysm
stent/coiling

CT HEAD WITHOUT CONTRAST
TECHNIQUE: Contiguous axial images were obtained from the base of
the skull through the vertex without contrast.

[Series 2: brain · axial · 0.47mm/px · z∈[+103,+256]mm · 13 of 36 slices shown, 17 images]
[im 3/36  brain]
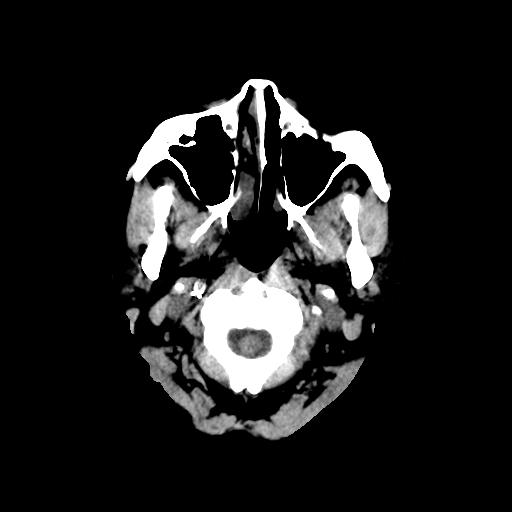
[im 3/36  bone]
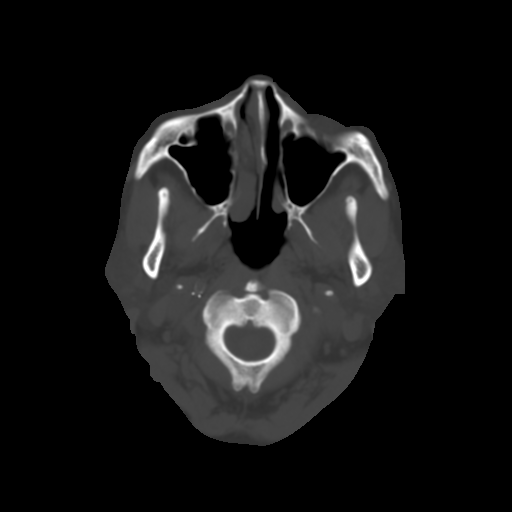
[im 6/36  brain]
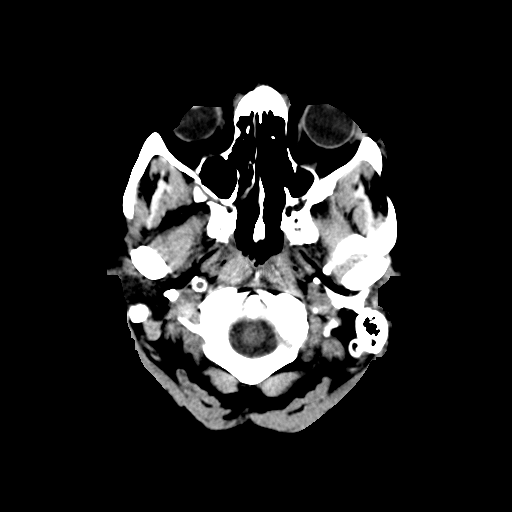
[im 8/36  brain]
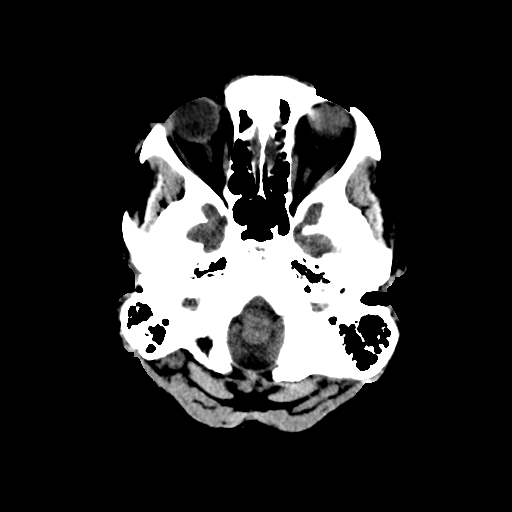
[im 11/36  brain]
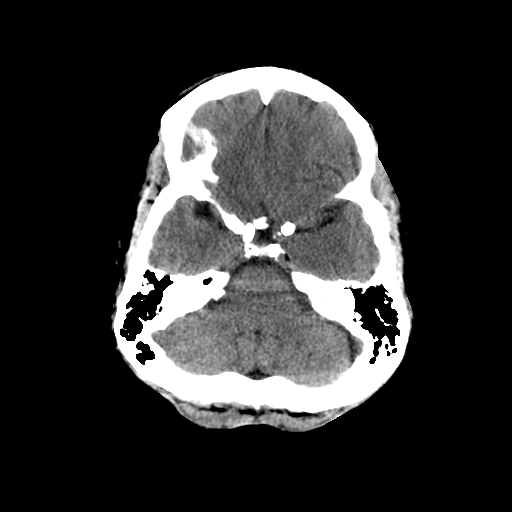
[im 13/36  brain]
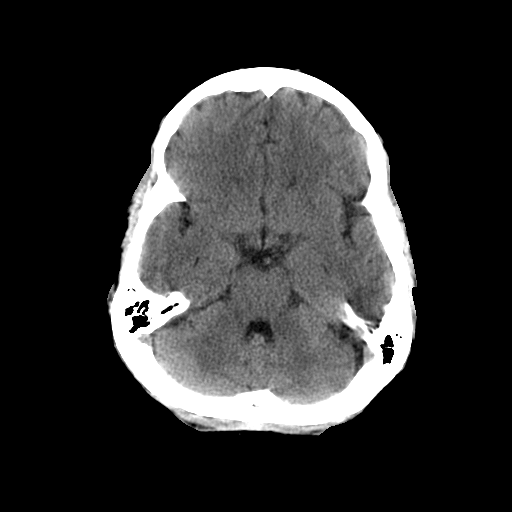
[im 13/36  bone]
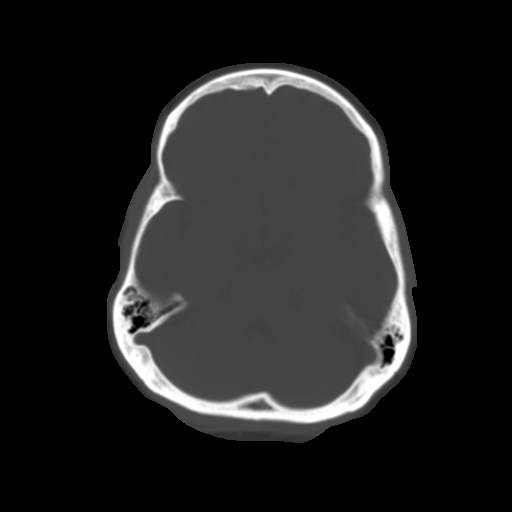
[im 16/36  brain]
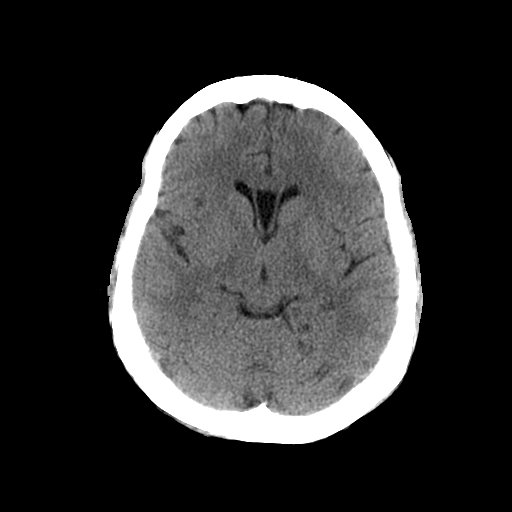
[im 18/36  brain]
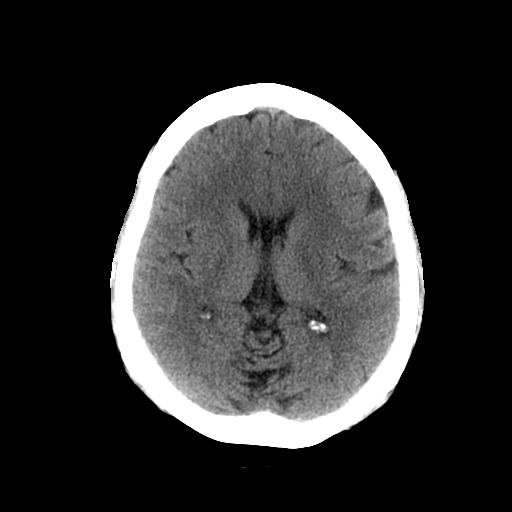
[im 21/36  brain]
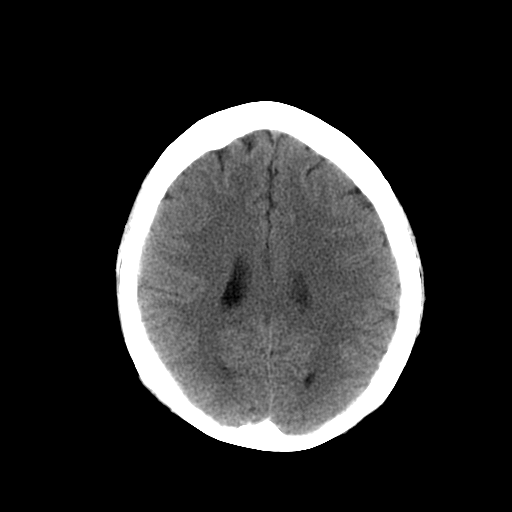
[im 23/36  brain]
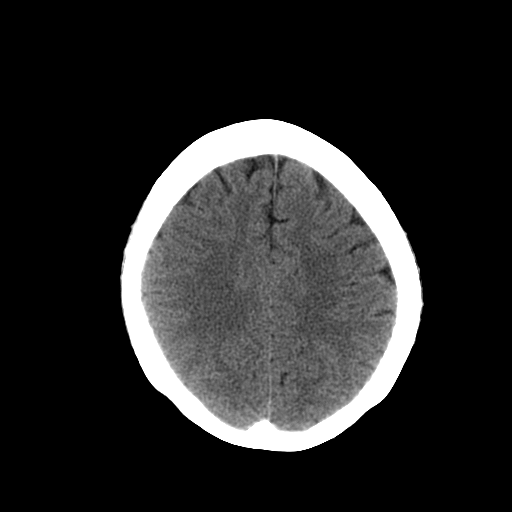
[im 23/36  bone]
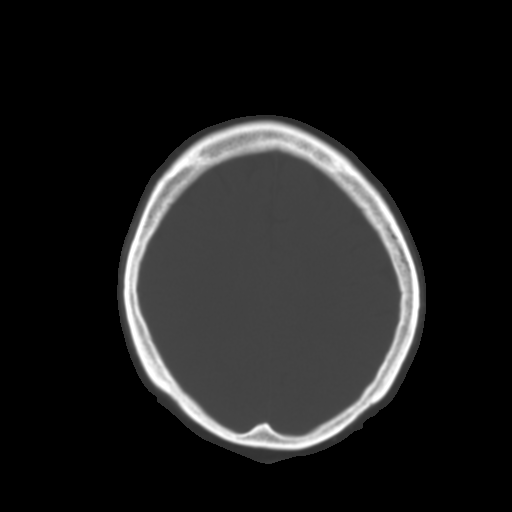
[im 26/36  brain]
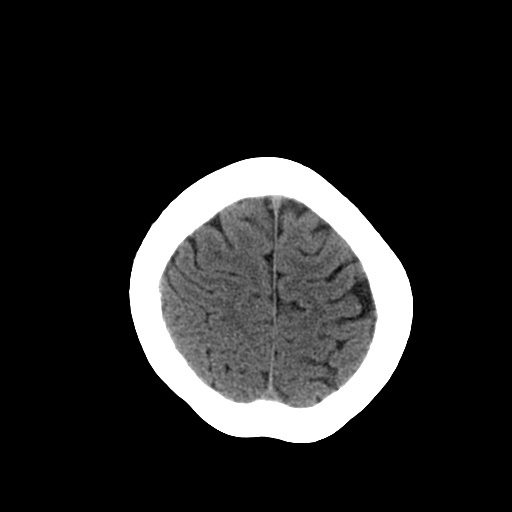
[im 28/36  brain]
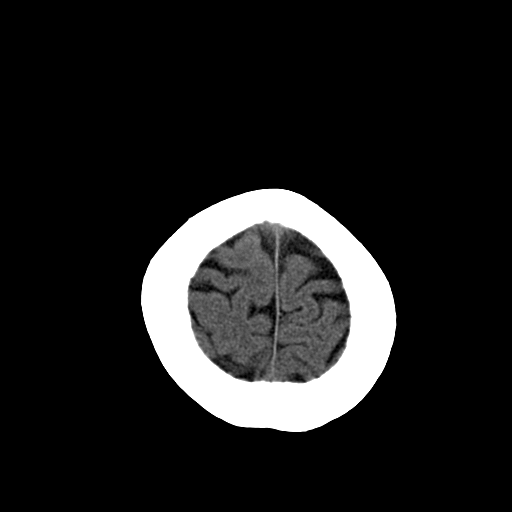
[im 31/36  brain]
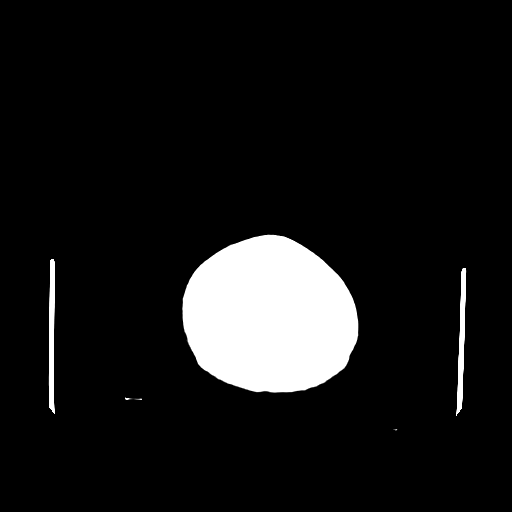
[im 33/36  brain]
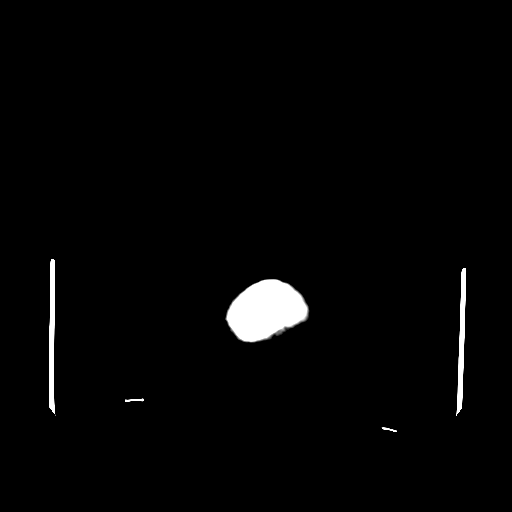
[im 33/36  bone]
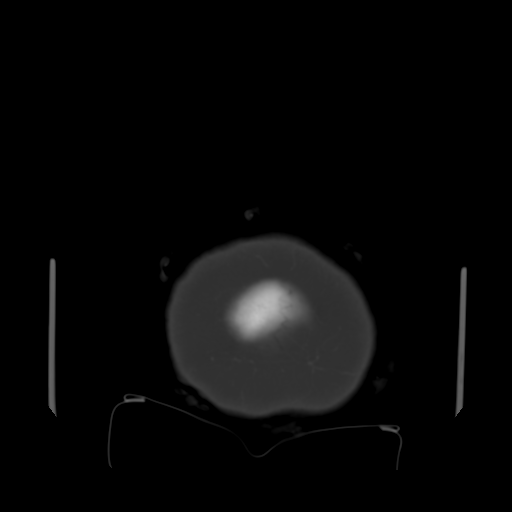

[13 of 30 positions shown; findings below may reference images not displayed]

FINDINGS: No evidence of parenchymal hemorrhage or extra-axial
fluid collection. No mass lesion, mass effect, or midline shift.

No CT evidence of acute infarction.

Cerebral volume is age appropriate.  No ventriculomegaly.

Right internal carotid stents.

The visualized paranasal sinuses are essentially clear. The mastoid
air cells are unopacified.

No evidence of calvarial fracture.
IMPRESSION: No evidence of acute intracranial abnormality.

Right internal carotid stents.

## 2012-11-04 DIAGNOSIS — M7542 Impingement syndrome of left shoulder: Secondary | ICD-10-CM | POA: Insufficient documentation

## 2013-01-28 DIAGNOSIS — M21371 Foot drop, right foot: Secondary | ICD-10-CM | POA: Insufficient documentation

## 2013-03-11 ENCOUNTER — Ambulatory Visit
Admission: RE | Admit: 2013-03-11 | Discharge: 2013-03-11 | Disposition: A | Discharge status: Home / Self Care | Payer: Medicare PPO | Source: Ambulatory Visit | Attending: Allergy and Immunology | Admitting: Allergy and Immunology

## 2013-03-11 ENCOUNTER — Other Ambulatory Visit: Payer: Self-pay | Admitting: Allergy and Immunology

## 2013-03-11 DIAGNOSIS — R0602 Shortness of breath: Secondary | ICD-10-CM

## 2013-04-01 ENCOUNTER — Ambulatory Visit (INDEPENDENT_AMBULATORY_CARE_PROVIDER_SITE_OTHER): Payer: Medicare PPO | Admitting: Internal Medicine

## 2013-04-01 ENCOUNTER — Encounter: Payer: Self-pay | Admitting: Internal Medicine

## 2013-04-01 VITALS — BP 134/80 | HR 85 | Temp 97.9°F | Ht 65.0 in | Wt 190.0 lb

## 2013-04-01 DIAGNOSIS — R0609 Other forms of dyspnea: Secondary | ICD-10-CM

## 2013-04-01 DIAGNOSIS — R0600 Dyspnea, unspecified: Secondary | ICD-10-CM

## 2013-04-01 NOTE — Patient Instructions (Addendum)
Have full PFT test at hospital Depending on test result CT scan chest or pulmonary bike stress test

## 2013-04-01 NOTE — Progress Notes (Signed)
Subjective:    Patient ID: Yolanda Yang, female    DOB: [birth year], 57 y.o.   MRN: [id number] PCP Evlyn Courier, MD REferred by DR Colonel Bald HPI  IOV 04/01/2013  57 year old female referred by Dr. Willa Rough for dyspnea evaluation. Reports one-year history of insidious onset of dyspnea. Progressive. Severity is moderate. Brought on by exertion walking short distances and also associated with orthopnea and some paroxysmal nocturnal dyspnea. She says this is different from asthma which is well-controlled and baseline symptoms for this or wheeze. There is no associated cough or chest pain or pedal edema. Walk test in the office 185 feet x3 laps: No desat. PEr hx, spirometry at allergy office [place]   Dyspnea relevant hx  Asthma  - Onset since childhood. She is on daily Qvar. Symptoms or wheeze. She follows at the allergy practice for few years. She is not on allergy shots. She is only on Qvar. Asthma is under control currently.  Tobacco exposure  reports that she quit smoking about 7 months ago. Her smoking use included Cigarettes. She smoked 0.50 packs per day. She does not have any smokeless tobacco history on file.  Weight  Body mass index is 31.62 kg/(m^2).  20  # weight gain x 1 ytear following steroid injection to shoulder and back surgery 2 years ago  Other pmhx    has a past medical history of Hypertension; Hyperlipemia; Shortness of breath; Blood transfusion; Headache(784.0); GERD (gastroesophageal reflux disease); Anemia; and Asthma.  - Status post C-spine surgery and back surgery for cervical stenosis   - denies cardiac workup  Imaging  - cxr 03/11/13: clear   Past Medical History  Diagnosis Date  . Hypertension   . Hyperlipemia   . Shortness of breath   . Blood transfusion   . Headache(784.0)   . GERD (gastroesophageal reflux disease)   . Anemia   . Asthma      Family History  Problem Relation Age of Onset  . Colon cancer Father      History   Social  History  . Marital Status: Single    Spouse Name: N/A    Number of Children: N/A  . Years of Education: N/A   Occupational History  . Not on file.   Social History Main Topics  . Smoking status: Former Smoker -- 0.50 packs/day    Types: Cigarettes    Quit date: 08/01/2012  . Smokeless tobacco: Not on file     Comment: electronic cigs  . Alcohol Use: No  . Drug Use: No  . Sexual Activity: Yes   Other Topics Concern  . Not on file   Social History Narrative  . No narrative on file     Allergies  Allergen Reactions  . Latex      Outpatient Prescriptions Prior to Visit  Medication Sig Dispense Refill  . aspirin 81 MG chewable tablet Chew 81 mg by mouth daily.        . butalbital-acetaminophen-caffeine (FIORICET, ESGIC) 50-325-40 MG per tablet Take 1 tablet by mouth daily as needed. For headaches        . cyclobenzaprine (FLEXERIL) 10 MG tablet Take 10 mg by mouth 3 (three) times daily.        . DULoxetine (CYMBALTA) 60 MG capsule Take 60 mg by mouth daily.        . fentaNYL (DURAGESIC - DOSED MCG/HR) 100 MCG/HR Place 1 patch onto the skin every other day. Sig states every 2 days       .  gabapentin (NEURONTIN) 300 MG capsule Take 300 mg by mouth 3 (three) times daily.        Marland Kitchen oxymetazoline (AFRIN) 0.05 % nasal spray Place 2 sprays into the nose 2 (two) times daily.        . pantoprazole (PROTONIX) 40 MG tablet Take 1 tablet (40 mg total) by mouth 2 (two) times daily before a meal.  30 tablet  0  . pentosan polysulfate (ELMIRON) 100 MG capsule Take 200 mg by mouth 2 (two) times daily.        . sucralfate (CARAFATE) 1 G tablet Take 1 tablet (1 g total) by mouth 4 (four) times daily -  with meals and at bedtime.  30 tablet  0  . tiotropium (SPIRIVA) 18 MCG inhalation capsule Place 18 mcg into inhaler and inhale daily.        Marland Kitchen oxyCODONE (ROXICODONE) 15 MG immediate release tablet Take 15 mg by mouth daily as needed. For pain for 30 days        . clopidogrel (PLAVIX) 75 MG  tablet Take 75 mg by mouth daily.        . rosuvastatin (CRESTOR) 10 MG tablet Take 10 mg by mouth daily.         No facility-administered medications prior to visit.      Review of Systems  Constitutional: Positive for unexpected weight change. Negative for fever.  HENT: Positive for hearing loss, congestion, trouble swallowing, postnasal drip and sinus pressure. Negative for ear pain, nosebleeds, sore throat, rhinorrhea, sneezing and dental problem.   Eyes: Positive for redness. Negative for itching.  Respiratory: Positive for chest tightness, shortness of breath and wheezing. Negative for cough.   Cardiovascular: Positive for leg swelling. Negative for palpitations.  Gastrointestinal: Negative for nausea and vomiting.  Genitourinary: Negative for dysuria.  Musculoskeletal: Negative for joint swelling.  Skin: Negative for rash.  Neurological: Positive for headaches.  Hematological: Bruises/bleeds easily.  Psychiatric/Behavioral: Negative for dysphoric mood. The patient is not nervous/anxious.        Objective:   Physical Exam  Vitals reviewed. Constitutional: She is oriented to person, place, and time. She appears well-developed and well-nourished. No distress.  Body mass index is 31.62 kg/(m^2).   HENT:  Head: Normocephalic and atraumatic.  Right Ear: External ear normal.  Left Ear: External ear normal.  Mouth/Throat: Oropharynx is clear and moist. No oropharyngeal exudate.  Anterior and posterior neck scars + Limited neck mobility + Tignt neck muscles +  Eyes: Conjunctivae and EOM are normal. Pupils are equal, round, and reactive to light. Right eye exhibits no discharge. Left eye exhibits no discharge. No scleral icterus.  Neck: Normal range of motion. Neck supple. No JVD present. No tracheal deviation present. No thyromegaly present.  Cardiovascular: Normal rate, regular rhythm, normal heart sounds and intact distal pulses.  Exam reveals no gallop and no friction rub.    No murmur heard. Pulmonary/Chest: Effort normal and breath sounds normal. No respiratory distress. She has no wheezes. She has no rales. She exhibits no tenderness.  Abdominal: Soft. Bowel sounds are normal. She exhibits no distension and no mass. There is no tenderness. There is no rebound and no guarding.  Musculoskeletal: Normal range of motion. She exhibits no edema and no tenderness.  Lymphadenopathy:    She has no cervical adenopathy.  Neurological: She is alert and oriented to person, place, and time. She has normal reflexes. No cranial nerve deficit. She exhibits normal muscle tone. Coordination normal.  Skin: Skin is  warm and dry. No rash noted. She is not diaphoretic. No erythema. No pallor.  Psychiatric: Her behavior is normal. Judgment and thought content normal.  Flat affect          Assessment & Plan:

## 2013-04-02 ENCOUNTER — Encounter: Payer: Self-pay | Admitting: Internal Medicine

## 2013-04-02 DIAGNOSIS — R0600 Dyspnea, unspecified: Secondary | ICD-10-CM | POA: Insufficient documentation

## 2013-04-02 NOTE — Assessment & Plan Note (Signed)
Unclear cause for dyspne but ddx includes obs lung diseaes, obesity, deconditioning, diast dysfhn. Will start with PFT and depending on those results CT chest, or CPST bike test. She is agreeable with plan

## 2013-04-07 ENCOUNTER — Encounter (HOSPITAL_COMMUNITY): Payer: Medicare PPO

## 2013-04-13 ENCOUNTER — Institutional Professional Consult (permissible substitution): Payer: Medicare PPO | Admitting: Internal Medicine

## 2013-04-15 ENCOUNTER — Ambulatory Visit (HOSPITAL_COMMUNITY)
Admission: RE | Admit: 2013-04-15 | Discharge: 2013-04-15 | Disposition: A | Discharge status: Home / Self Care | Payer: Medicare PPO | Source: Ambulatory Visit | Attending: Internal Medicine | Admitting: Internal Medicine

## 2013-04-15 DIAGNOSIS — R0989 Other specified symptoms and signs involving the circulatory and respiratory systems: Secondary | ICD-10-CM | POA: Insufficient documentation

## 2013-04-15 DIAGNOSIS — Z87891 Personal history of nicotine dependence: Secondary | ICD-10-CM | POA: Insufficient documentation

## 2013-04-15 DIAGNOSIS — R0609 Other forms of dyspnea: Secondary | ICD-10-CM | POA: Insufficient documentation

## 2013-04-15 DIAGNOSIS — R0600 Dyspnea, unspecified: Secondary | ICD-10-CM

## 2013-04-15 LAB — PULMONARY FUNCTION TEST

## 2013-04-15 MED ORDER — ALBUTEROL SULFATE (5 MG/ML) 0.5% IN NEBU
2.5000 mg | INHALATION_SOLUTION | Freq: Once | RESPIRATORY_TRACT | Status: AC
  Administered 2013-04-15: 2.5 mg via RESPIRATORY_TRACT

## 2013-04-19 ENCOUNTER — Telehealth: Payer: Self-pay | Admitting: Internal Medicine

## 2013-04-19 DIAGNOSIS — R0600 Dyspnea, unspecified: Secondary | ICD-10-CM

## 2013-04-19 NOTE — Telephone Encounter (Signed)
Pt had PFT 04/15/13 at [facility]. Please advise MR thanks

## 2013-04-19 NOTE — Telephone Encounter (Signed)
Function test shows a combination of obstruction and restriction with significant reduction in diffusion capacity 04/19/13. . Tis is not normal and we need to do more workup and is not exactly clear why he does not normal. LEt patient know that next at this to do a CT scan of the chest to make sure her to differentiate between emphysema and interstitial lung disease of both. We'll call her with results as soon as CT scan of the chest complete to advise next step.   Dr. Kalman Shan, M.D., [facility].C.P Pulmonary and Critical Care Medicine Staff Physician [facility] [place] Pulmonary and Critical Care Pager: [phone], If no answer or between  15:00h - 7:00h: call [phone]  04/19/2013 4:22 PM

## 2013-04-19 NOTE — Telephone Encounter (Signed)
PFT 10/16?14 shows mild isolated reduction in diffusion capacity to 77%. Normal being 80%. Otherwise essentially normal test. Therefore we do not have a clear reason why she is having shortness of breath at the best place to start next is to do a CT scan of the chest. I have ordered CT scan of the chest. Once these results are available we'll instruct her the next step in dyspnea evaluation  NOted the pft result I gave dated 04/19/13 is wrong and belongs to another patient  Dr. Kalman Shan, M.D., [facility].C.P Pulmonary and Critical Care Medicine Staff Physician  [facility] [place] Pulmonary and Critical Care Pager: [phone], If no answer or between  15:00h - 7:00h: call [phone]  04/19/2013 5:38 PM

## 2013-04-20 NOTE — Telephone Encounter (Signed)
lmomtcb for pt 

## 2013-04-20 NOTE — Telephone Encounter (Signed)
Patient returning call.

## 2013-04-20 NOTE — Telephone Encounter (Signed)
Spoke with the pt and notified of results/recs per Dr. Marchelle Gearing She verbalized understanding

## 2013-04-26 ENCOUNTER — Ambulatory Visit (INDEPENDENT_AMBULATORY_CARE_PROVIDER_SITE_OTHER)
Admission: RE | Admit: 2013-04-26 | Discharge: 2013-04-26 | Disposition: A | Discharge status: Home / Self Care | Payer: Medicare PPO | Source: Ambulatory Visit | Attending: Internal Medicine | Admitting: Internal Medicine

## 2013-04-26 DIAGNOSIS — R0600 Dyspnea, unspecified: Secondary | ICD-10-CM

## 2013-04-26 DIAGNOSIS — R0609 Other forms of dyspnea: Secondary | ICD-10-CM

## 2013-04-27 ENCOUNTER — Telehealth: Payer: Self-pay | Admitting: Internal Medicine

## 2013-04-27 DIAGNOSIS — R0600 Dyspnea, unspecified: Secondary | ICD-10-CM

## 2013-04-27 NOTE — Telephone Encounter (Signed)
  CT chest is normal 04/26/13. So still not clear why dyspnea. She knows next step is to do CPST test; please order with Mr Renae Fickle cHase along with EIB challenge  Also, order D-dimer blood test, if this is abnormal she will need VQ scan for blood clot  I have explained algorirthm in office, so she should understand   Followup after CPST  Dr. Kalman Shan, M.D., [facility].C.P Pulmonary and Critical Care Medicine Staff Physician [facility]  Pulmonary and Critical Care Pager: [phone], If no answer or between  15:00h - 7:00h: call [phone]  04/27/2013 5:27 PM      Ct Chest Wo Contrast  04/26/2013   CLINICAL DATA:  Worsening shortness of breath. Evaluate for interstitial lung disease.  EXAM: CT CHEST WITHOUT CONTRAST  TECHNIQUE: Multidetector CT imaging of the chest was performed following the standard protocol without IV contrast.  COMPARISON:  None.  FINDINGS: No pathologically enlarged mediastinal, hilar or axillary lymph nodes. Heart size normal. No pericardial effusion.  Lungs are clear. No subpleural reticulation, traction bronchiectasis/ bronchiolectasis, architectural distortion or honeycombing. No air trapping on expiratory and expiratory imaging. No pleural fluid. Airway is unremarkable.  Incidental imaging of the upper abdomen shows no acute findings. No worrisome lytic or sclerotic lesions. Degenerative changes are seen in the spine.  IMPRESSION: No evidence of interstitial lung disease. No findings to explain the patient's worsening shortness of breath.   Electronically Signed   By: Leanna Battles M.D.   On: 04/26/2013 13:33

## 2013-04-27 NOTE — Telephone Encounter (Signed)
I spoke with patient about results and she verbalized understanding and had no questions. Orders placed

## 2013-04-27 NOTE — Telephone Encounter (Signed)
I spoke with pt. She is requesting her CT results from yesterday. Please advise MR thanks

## 2013-04-29 ENCOUNTER — Other Ambulatory Visit: Payer: Medicare PPO

## 2013-04-29 DIAGNOSIS — R0600 Dyspnea, unspecified: Secondary | ICD-10-CM

## 2013-04-30 LAB — D-DIMER, QUANTITATIVE (NOT AT ARMC): D-Dimer, Quant: 0.36 ug/mL-FEU (ref 0.00–0.48)

## 2013-05-11 ENCOUNTER — Encounter (HOSPITAL_COMMUNITY): Payer: Medicare PPO

## 2013-05-12 ENCOUNTER — Encounter (HOSPITAL_COMMUNITY): Payer: Medicare PPO

## 2013-05-13 ENCOUNTER — Ambulatory Visit: Payer: Medicare PPO | Admitting: Internal Medicine

## 2013-05-26 ENCOUNTER — Telehealth: Payer: Self-pay | Admitting: Internal Medicine

## 2013-05-26 ENCOUNTER — Ambulatory Visit: Payer: Self-pay | Admitting: Podiatry

## 2013-05-26 NOTE — Telephone Encounter (Signed)
Pt decided to wait she is having shoulder surgery on 06/03/13 and will call back after rehab for that Yolanda Yang

## 2013-06-04 ENCOUNTER — Telehealth: Payer: Self-pay | Admitting: Internal Medicine

## 2013-06-04 NOTE — Telephone Encounter (Signed)
Order already in EPIC. Please advise PCC's thanks

## 2013-06-04 NOTE — Telephone Encounter (Signed)
Pt has cancelled and rescheduled cpst several times i gave her the # to call and reschedule [phone] Yolanda Yang

## 2013-06-08 ENCOUNTER — Ambulatory Visit (HOSPITAL_COMMUNITY): Payer: Medicare PPO | Attending: Internal Medicine

## 2013-06-08 DIAGNOSIS — R0989 Other specified symptoms and signs involving the circulatory and respiratory systems: Secondary | ICD-10-CM | POA: Insufficient documentation

## 2013-06-08 DIAGNOSIS — R0609 Other forms of dyspnea: Secondary | ICD-10-CM | POA: Insufficient documentation

## 2013-06-08 DIAGNOSIS — R0600 Dyspnea, unspecified: Secondary | ICD-10-CM

## 2013-06-09 ENCOUNTER — Telehealth: Payer: Self-pay | Admitting: Internal Medicine

## 2013-06-09 NOTE — Telephone Encounter (Signed)
I called and spoke with pt. She had CPST yesterday. She was told by the technician he could tell she wasn;t getting good airflow in. He advised her to see about getting an "aerochanmber" for her inhaler to help get the medication in deeper into her lungs. Please advise MR if this is okay? thanks

## 2013-06-09 NOTE — Telephone Encounter (Signed)
She has asthmatic response; I am waoiting for rest of test. Can you give jer aerochamber. She might need to see me later this week or TP next week to go over CPST results;' likely needs upping of asthma Rx  Dr. Kalman Shan, M.D., [facility].C.P Pulmonary and Critical Care Medicine Staff Physician [place] [facility] [place] Pulmonary and Critical Care Pager: [phone], If no answer or between  15:00h - 7:00h: call [phone]  06/09/2013 1:39 PM

## 2013-06-09 NOTE — Telephone Encounter (Signed)
Called spoke with patient and discussed MR's recs as stated below.  Pt stated that she would like to wait to receive her Aerochamber at the office visit.  MR with openings this week, but is not in the office again until 12.22.14.  Offered appt with TP next week but pt declined, wanting to see MR and preferred to wait until 12.22.14 to be seen >> appt scheduled for 12.22.14 @ 3.15pm.  Pt advised to call the office if needed prior to appt.  Will sign and forward to MR as FYI.

## 2013-06-16 DIAGNOSIS — R0602 Shortness of breath: Secondary | ICD-10-CM

## 2013-06-21 ENCOUNTER — Encounter: Payer: Self-pay | Admitting: Internal Medicine

## 2013-06-21 ENCOUNTER — Ambulatory Visit (INDEPENDENT_AMBULATORY_CARE_PROVIDER_SITE_OTHER): Payer: Medicare PPO | Admitting: Internal Medicine

## 2013-06-21 VITALS — BP 116/76 | HR 78 | Ht 65.0 in | Wt 188.0 lb

## 2013-06-21 DIAGNOSIS — R0600 Dyspnea, unspecified: Secondary | ICD-10-CM

## 2013-06-21 DIAGNOSIS — R0609 Other forms of dyspnea: Secondary | ICD-10-CM

## 2013-06-21 MED ORDER — AEROCHAMBER MV MISC: Status: DC

## 2013-06-21 NOTE — Progress Notes (Signed)
Subjective:    Patient ID: Yolanda Yang, female    DOB: 11/14/1955, 57 y.o.   MRN: [id number]  HPI PCP Evlyn Courier, MD REferred by DR Colonel Bald HPI  IOV 04/01/2013  57 year old female referred by Dr. Willa Rough for dyspnea evaluation. Reports one-year history of insidious onset of dyspnea. Progressive. Severity is moderate. Brought on by exertion walking short distances and also associated with orthopnea and some paroxysmal nocturnal dyspnea. She says this is different from asthma which is well-controlled and baseline symptoms for this or wheeze. There is no associated cough or chest pain or pedal edema. Walk test in the office 185 feet x3 laps: No desat. PEr hx, spirometry at allergy office [place]   Dyspnea relevant hx  Asthma  - Onset since childhood. She is on daily Qvar. Symptoms or wheeze. She follows at the allergy practice for few years. She is not on allergy shots. She is only on Qvar. Asthma is under control currently.  Tobacco exposure  reports that she quit smoking about 7 months ago. Her smoking use included Cigarettes. She smoked 0.50 packs per day. She does not have any smokeless tobacco history on file.  Weight  Body mass index is 31.62 kg/(m^2).  20  # weight gain x 1 ytear following steroid injection to shoulder and back surgery 2 years ago  Other pmhx    has a past medical history of Hypertension; Hyperlipemia; Shortness of breath; Blood transfusion; Headache(784.0); GERD (gastroesophageal reflux disease); Anemia; and Asthma.  - Status post C-spine surgery and back surgery for cervical stenosis   - denies cardiac workup  Imaging  - cxr 03/11/13: clear   OV 06/21/2013  Discussio visit for result review. Says adavir worked well in past. Not sure why she is not on it. Also prefers using spacer  Function test shows a combination of obstruction and restriction with significant reduction in diffusion capacity 04/19/13.    CT chest is normal 04/26/13. So  still not clear why dyspnea.  D-dimer 04/29/13- 0.36 and normal  CPST 06/08/13  Conclusion: Exercise testing with gas exchange demonstrates a low normal functional capacity when compared to matched sedentary norms. There was a borderline restrictive response to the exercise, but no overt cardiac or ventilatory limitation to the exercise. Post-exercise spirometry demonstrates an EIB response.   Report Prepared by: Almedia Balls, PhD, ACSM-RCEP Sr. Exercise Physiologist 06/11/2013 2:08 PM   Finalized by  Dr. Kalman Shan, M.D., F.C.C.P Pulmonary and Critical Care Medicine Staff Physician [facility] Pager: [phone], If no answer or between 15:00h - 7:00h: call [phone]  06/16/2013 1:43 AM        Review of Systems  Constitutional: Positive for fatigue. Negative for fever and unexpected weight change.  HENT: Negative for congestion, dental problem, ear pain, nosebleeds, postnasal drip, rhinorrhea, sinus pressure, sneezing, sore throat and trouble swallowing.   Eyes: Negative for redness and itching.  Respiratory: Positive for shortness of breath and wheezing. Negative for cough and chest tightness.   Cardiovascular: Positive for palpitations. Negative for leg swelling.  Gastrointestinal: Negative for nausea and vomiting.  Genitourinary: Negative for dysuria.  Musculoskeletal: Negative for joint swelling.  Skin: Negative for rash.  Neurological: Negative for headaches.  Hematological: Does not bruise/bleed easily.  Psychiatric/Behavioral: Negative for dysphoric mood. The patient is not nervous/anxious.    Current outpatient prescriptions:aspirin 81 MG chewable tablet, Chew 81 mg by mouth daily.  , Disp: , Rfl: ;  beclomethasone (QVAR) 80 MCG/ACT inhaler, Inhale 2 puffs into the lungs 2 (two) times daily., Disp: , Rfl: ;  butalbital-acetaminophen-caffeine (FIORICET, ESGIC) 50-325-40 MG per tablet, Take 1 tablet by mouth  daily as needed. For headaches  , Disp: , Rfl:  cyclobenzaprine (FLEXERIL) 10 MG tablet, Take 10 mg by mouth 3 (three) times daily.  , Disp: , Rfl: ;  diazepam (VALIUM) 5 MG tablet, Take 5 mg by mouth every 6 (six) hours as needed for anxiety., Disp: , Rfl: ;  DULoxetine (CYMBALTA) 60 MG capsule, Take 60 mg by mouth daily.  , Disp: , Rfl: ;  fentaNYL (DURAGESIC - DOSED MCG/HR) 100 MCG/HR, Place 1 patch onto the skin every 3 (three) days. Sig states every 2 days, Disp: , Rfl:  gabapentin (NEURONTIN) 300 MG capsule, Take 300 mg by mouth 3 (three) times daily.  , Disp: , Rfl: ;  HYDROmorphone (DILAUDID) 4 MG tablet, Take 4 mg by mouth every 4 (four) hours as needed for pain., Disp: , Rfl: ;  Lactobacillus ([place] PRO-B) CAPS, Take 1 capsule by mouth every morning., Disp: , Rfl: ;  oxymetazoline (AFRIN) 0.05 % nasal spray, Place 2 sprays into the nose 2 (two) times daily.  , Disp: , Rfl:  pantoprazole (PROTONIX) 40 MG tablet, Take 1 tablet (40 mg total) by mouth 2 (two) times daily before a meal., Disp: 30 tablet, Rfl: 0;  pentosan polysulfate (ELMIRON) 100 MG capsule, Take 200 mg by mouth 2 (two) times daily.  , Disp: , Rfl: ;  promethazine (PHENERGAN) 12.5 MG tablet, Take 12.5 mg by mouth every 6 (six) hours as needed for nausea., Disp: , Rfl: ;  rosuvastatin (CRESTOR) 10 MG tablet, Take 10 mg by mouth daily., Disp: , Rfl:  tiotropium (SPIRIVA) 18 MCG inhalation capsule, Place 18 mcg into inhaler and inhale daily.  , Disp: , Rfl:      Objective:   Physical Exam    Filed Vitals:   06/21/13 1524  BP: 116/76  Pulse: 78  Height: 5\' 5"  (1.651 m)  Weight: 188 lb (85.276 kg)  SpO2: 98%   Discussion only visit        Assessment & Plan:

## 2013-06-21 NOTE — Patient Instructions (Signed)
Shortness of breath is deu to asthma followed by weight and physical fitness Stop qvar. Instead try advair 250/50, 1 puff twice daily Continue albuterol 2 puff as needed, use it with spacer (take script for spacer) Glad you had flu shot Return in 6-8 weeks to report response Spirometry at followup

## 2013-06-24 NOTE — Assessment & Plan Note (Signed)
Shortness of breath is deu to asthma followed by weight and physical fitness Stop qvar. Instead try advair 250/50, 1 puff twice daily Continue albuterol 2 puff as needed, use it with spacer (take script for spacer) Glad you had flu shot Return in 6-8 weeks to report response Spirometry at followup  (> 50% of this 15 min visit spent in face to face counseling)

## 2013-08-09 ENCOUNTER — Ambulatory Visit (INDEPENDENT_AMBULATORY_CARE_PROVIDER_SITE_OTHER): Payer: Commercial Managed Care - HMO | Admitting: Internal Medicine

## 2013-08-09 ENCOUNTER — Encounter: Payer: Self-pay | Admitting: Internal Medicine

## 2013-08-09 VITALS — BP 120/78 | HR 78 | Ht 65.0 in | Wt 185.2 lb

## 2013-08-09 DIAGNOSIS — R0609 Other forms of dyspnea: Secondary | ICD-10-CM

## 2013-08-09 DIAGNOSIS — R0989 Other specified symptoms and signs involving the circulatory and respiratory systems: Secondary | ICD-10-CM

## 2013-08-09 DIAGNOSIS — R0600 Dyspnea, unspecified: Secondary | ICD-10-CM

## 2013-08-09 MED ORDER — FLUTICASONE-SALMETEROL 250-50 MCG/DOSE IN AEPB: 1.0000 | INHALATION_SPRAY | Freq: Two times a day (BID) | RESPIRATORY_TRACT | Status: DC

## 2013-08-09 MED ORDER — ALBUTEROL SULFATE (2.5 MG/3ML) 0.083% IN NEBU: 2.5000 mg | INHALATION_SOLUTION | Freq: Four times a day (QID) | RESPIRATORY_TRACT | Status: DC | PRN

## 2013-08-09 NOTE — Progress Notes (Signed)
Subjective:    Patient ID: Yolanda Yang, female    DOB: [birth year], 58 y.o.   MRN: [id number]  HPI  PCP Maggie Font, MD REferred by DR Orinda Kenner  IOV 04/01/2013 58 year old female referred by Dr. Ishmael Holter for dyspnea evaluation. Reports one-year history of insidious onset of dyspnea. Progressive. Severity is moderate. Brought on by exertion walking short distances and also associated with orthopnea and some paroxysmal nocturnal dyspnea. She says this is different from asthma which is well-controlled and baseline symptoms for this or wheeze. There is no associated cough or chest pain or pedal edema. Walk test in the office 185 feet x3 laps: No desat. PEr hx, spirometry at allergy office [place]   Dyspnea relevant hx  Asthma  - Onset since childhood. She is on daily Qvar. Symptoms or wheeze. She follows at the allergy practice for few years. She is not on allergy shots. She is only on Qvar. Asthma is under control currently.  Tobacco exposure  reports that she quit smoking about 7 months ago. Her smoking use included Cigarettes. She smoked 0.50 packs per day. She does not have any smokeless tobacco history on file.  Weight  Body mass index is 31.62 kg/(m^2).  20  # weight gain x 1 ytear following steroid injection to shoulder and back surgery 2 years ago  Other pmhx    has a past medical history of Hypertension; Hyperlipemia; Shortness of breath; Blood transfusion; Headache(784.0); GERD (gastroesophageal reflux disease); Anemia; and Asthma.  - Status post C-spine surgery and back surgery for cervical stenosis   - denies cardiac workup  Imaging  - cxr 03/11/13: clear   OV 06/21/2013  Discussio visit for result review. Says adavir worked well in past. Not sure why she is not on it. Also prefers using spacer  Function test shows a combination of obstruction and restriction with significant reduction in diffusion capacity 04/19/13.    CT chest is normal 04/26/13. So still  not clear why dyspnea.  D-dimer 04/29/13- 0.36 and normal  CPST 06/08/13  Conclusion: Exercise testing with gas exchange demonstrates a low normal functional capacity when compared to matched sedentary norms. There was a borderline restrictive response to the exercise, but no overt cardiac or ventilatory limitation to the exercise. Post-exercise spirometry demonstrates an EIB response.   Report Prepared by: Linzie Collin, PhD, ACSM-RCEP Sr. Exercise Physiologist 06/11/2013 2:08 PM   Finalized by  Dr. Brand Males, M.D., F.C.C.P Pulmonary and Critical Care Medicine Staff Physician [facility] Pager: [phone], If no answer or between 15:00h - 7:00h: call [phone]  06/16/2013 1:43 AM       REC Shortness of breath is deu to asthma followed by weight and physical fitness Stop qvar. Instead try advair 250/50, 1 puff twice daily Continue albuterol 2 puff as needed, use it with spacer (take script for spacer) Glad you had flu shot Return in 6-8 weeks to report response Spirometry at followup   OV 08/09/2013  Chief Complaint  Patient presents with  . Follow-up    for SOB. Pt states that [place] and proair work well for her.   . Medication Management    pt ask for rx for albuterol neb medicaiton.    FU dyspnea due to astthma, obesity and lack of fitness. AFter starting advair last visit she is doing much better. No new complaints. Has lost some weight per her hx. She wants nebs for rescue just in case  Review of Systems  Constitutional: Negative for fever and unexpected weight change.  HENT: Negative for congestion, dental problem, ear pain, nosebleeds, postnasal drip, rhinorrhea, sinus pressure, sneezing, sore throat and trouble swallowing.   Eyes: Negative for redness and itching.  Respiratory: Positive for cough, chest tightness and shortness of breath. Negative for wheezing.   Cardiovascular: Negative for  palpitations and leg swelling.  Gastrointestinal: Negative for nausea and vomiting.  Genitourinary: Negative for dysuria.  Musculoskeletal: Negative for joint swelling.  Skin: Negative for rash.  Neurological: Negative for headaches.  Hematological: Does not bruise/bleed easily.  Psychiatric/Behavioral: Negative for dysphoric mood. The patient is not nervous/anxious.        Objective:   Physical Exam  Vitals reviewed. Constitutional: She is oriented to person, place, and time. She appears well-developed and well-nourished. No distress.  HENT:  Head: Normocephalic and atraumatic.  Right Ear: External ear normal.  Left Ear: External ear normal.  Mouth/Throat: Oropharynx is clear and moist. No oropharyngeal exudate.  Eyes: Conjunctivae and EOM are normal. Pupils are equal, round, and reactive to light. Right eye exhibits no discharge. Left eye exhibits no discharge. No scleral icterus.  Neck: Normal range of motion. Neck supple. No JVD present. No tracheal deviation present. No thyromegaly present.  Cardiovascular: Normal rate, regular rhythm, normal heart sounds and intact distal pulses.  Exam reveals no gallop and no friction rub.   No murmur heard. Pulmonary/Chest: Effort normal and breath sounds normal. No respiratory distress. She has no wheezes. She has no rales. She exhibits no tenderness.  Abdominal: Soft. Bowel sounds are normal. She exhibits no distension and no mass. There is no tenderness. There is no rebound and no guarding.  Musculoskeletal: Normal range of motion. She exhibits no edema and no tenderness.  Lymphadenopathy:    She has no cervical adenopathy.  Neurological: She is alert and oriented to person, place, and time. She has normal reflexes. No cranial nerve deficit. She exhibits normal muscle tone. Coordination normal.  Skin: Skin is warm and dry. No rash noted. She is not diaphoretic. No erythema. No pallor.  Psychiatric: She has a normal mood and affect. Her  behavior is normal. Judgment and thought content normal.          Assessment & Plan:

## 2013-08-09 NOTE — Patient Instructions (Signed)
Glad you are doing well Continue advair 250/50 , 1 puff twice daily and albuterol as needed Also we will write you for albuterol nebulizer to be used not more than 2 times a week Continue weight loss  Followup  6 months or sooner if needed

## 2013-08-16 ENCOUNTER — Ambulatory Visit: Payer: Medicare PPO | Admitting: Physical Therapy

## 2013-08-21 NOTE — Assessment & Plan Note (Signed)
Glad you are doing well Continue advair 250/50 , 1 puff twice daily and albuterol as needed Also we will write you for albuterol nebulizer to be used not more than 2 times a week Continue weight loss  Followup  6 months or sooner if needed 

## 2013-08-23 ENCOUNTER — Encounter: Payer: Self-pay | Admitting: Podiatry

## 2013-08-23 ENCOUNTER — Ambulatory Visit (INDEPENDENT_AMBULATORY_CARE_PROVIDER_SITE_OTHER): Payer: Commercial Managed Care - HMO | Admitting: Podiatry

## 2013-08-23 VITALS — BP 118/64 | HR 86 | Resp 12

## 2013-08-23 DIAGNOSIS — B351 Tinea unguium: Secondary | ICD-10-CM

## 2013-08-23 DIAGNOSIS — M79609 Pain in unspecified limb: Secondary | ICD-10-CM

## 2013-08-24 ENCOUNTER — Ambulatory Visit: Payer: Medicare PPO | Admitting: Physical Therapy

## 2013-08-24 ENCOUNTER — Emergency Department (HOSPITAL_COMMUNITY)
Admission: EM | Admit: 2013-08-24 | Discharge: 2013-08-24 | Disposition: A | Discharge status: Home / Self Care | Payer: Medicare PPO | Attending: Emergency Medicine | Admitting: Emergency Medicine

## 2013-08-24 ENCOUNTER — Encounter (HOSPITAL_COMMUNITY): Payer: Self-pay | Admitting: Emergency Medicine

## 2013-08-24 DIAGNOSIS — Z3202 Encounter for pregnancy test, result negative: Secondary | ICD-10-CM | POA: Insufficient documentation

## 2013-08-24 DIAGNOSIS — K219 Gastro-esophageal reflux disease without esophagitis: Secondary | ICD-10-CM | POA: Insufficient documentation

## 2013-08-24 DIAGNOSIS — F17200 Nicotine dependence, unspecified, uncomplicated: Secondary | ICD-10-CM | POA: Insufficient documentation

## 2013-08-24 DIAGNOSIS — I10 Essential (primary) hypertension: Secondary | ICD-10-CM | POA: Insufficient documentation

## 2013-08-24 DIAGNOSIS — K279 Peptic ulcer, site unspecified, unspecified as acute or chronic, without hemorrhage or perforation: Secondary | ICD-10-CM

## 2013-08-24 DIAGNOSIS — E785 Hyperlipidemia, unspecified: Secondary | ICD-10-CM | POA: Insufficient documentation

## 2013-08-24 DIAGNOSIS — Z862 Personal history of diseases of the blood and blood-forming organs and certain disorders involving the immune mechanism: Secondary | ICD-10-CM | POA: Diagnosis not present

## 2013-08-24 DIAGNOSIS — Z7982 Long term (current) use of aspirin: Secondary | ICD-10-CM | POA: Diagnosis not present

## 2013-08-24 DIAGNOSIS — J45909 Unspecified asthma, uncomplicated: Secondary | ICD-10-CM | POA: Insufficient documentation

## 2013-08-24 DIAGNOSIS — Z79899 Other long term (current) drug therapy: Secondary | ICD-10-CM | POA: Insufficient documentation

## 2013-08-24 DIAGNOSIS — IMO0002 Reserved for concepts with insufficient information to code with codable children: Secondary | ICD-10-CM | POA: Diagnosis not present

## 2013-08-24 DIAGNOSIS — Z9889 Other specified postprocedural states: Secondary | ICD-10-CM | POA: Insufficient documentation

## 2013-08-24 DIAGNOSIS — Z90710 Acquired absence of both cervix and uterus: Secondary | ICD-10-CM | POA: Diagnosis not present

## 2013-08-24 DIAGNOSIS — R109 Unspecified abdominal pain: Secondary | ICD-10-CM | POA: Diagnosis present

## 2013-08-24 DIAGNOSIS — Z91040 Latex allergy status: Secondary | ICD-10-CM | POA: Diagnosis not present

## 2013-08-24 LAB — CBC WITH DIFFERENTIAL/PLATELET
Basophils Absolute: 0 10*3/uL (ref 0.0–0.1)
Basophils Relative: 0 % (ref 0–1)
EOS PCT: 1 % (ref 0–5)
Eosinophils Absolute: 0.1 10*3/uL (ref 0.0–0.7)
HCT: 40.6 % (ref 36.0–46.0)
HEMOGLOBIN: 13.2 g/dL (ref 12.0–15.0)
LYMPHS PCT: 10 % — AB (ref 12–46)
Lymphs Abs: 1.1 10*3/uL (ref 0.7–4.0)
MCH: 29 pg (ref 26.0–34.0)
MCHC: 32.5 g/dL (ref 30.0–36.0)
MCV: 89.2 fL (ref 78.0–100.0)
MONOS PCT: 3 % (ref 3–12)
Monocytes Absolute: 0.4 10*3/uL (ref 0.1–1.0)
NEUTROS ABS: 9.4 10*3/uL — AB (ref 1.7–7.7)
Neutrophils Relative %: 86 % — ABNORMAL HIGH (ref 43–77)
Platelets: 345 10*3/uL (ref 150–400)
RBC: 4.55 MIL/uL (ref 3.87–5.11)
RDW: 14.1 % (ref 11.5–15.5)
WBC: 10.9 10*3/uL — AB (ref 4.0–10.5)

## 2013-08-24 LAB — URINALYSIS, ROUTINE W REFLEX MICROSCOPIC
BILIRUBIN URINE: NEGATIVE
GLUCOSE, UA: NEGATIVE mg/dL
Hgb urine dipstick: NEGATIVE
Ketones, ur: NEGATIVE mg/dL
Leukocytes, UA: NEGATIVE
Nitrite: NEGATIVE
PH: 7 (ref 5.0–8.0)
Protein, ur: NEGATIVE mg/dL
Specific Gravity, Urine: 1.011 (ref 1.005–1.030)
Urobilinogen, UA: 0.2 mg/dL (ref 0.0–1.0)

## 2013-08-24 LAB — COMPREHENSIVE METABOLIC PANEL
ALBUMIN: 4.4 g/dL (ref 3.5–5.2)
ALT: 12 U/L (ref 0–35)
AST: 19 U/L (ref 0–37)
Alkaline Phosphatase: 85 U/L (ref 39–117)
BILIRUBIN TOTAL: 0.3 mg/dL (ref 0.3–1.2)
BUN: 6 mg/dL (ref 6–23)
CHLORIDE: 99 meq/L (ref 96–112)
CO2: 26 mEq/L (ref 19–32)
Calcium: 9.7 mg/dL (ref 8.4–10.5)
Creatinine, Ser: 0.68 mg/dL (ref 0.50–1.10)
GFR calc Af Amer: >90 mL/min (ref >90)
GFR calc non Af Amer: >90 mL/min (ref >90)
Glucose, Bld: 114 mg/dL — ABNORMAL HIGH (ref 70–99)
POTASSIUM: 4 meq/L (ref 3.7–5.3)
SODIUM: 141 meq/L (ref 137–147)
Total Protein: 8.3 g/dL (ref 6.0–8.3)

## 2013-08-24 LAB — POC URINE PREG, ED: Preg Test, Ur: NEGATIVE

## 2013-08-24 LAB — LIPASE, BLOOD: Lipase: 14 U/L (ref 11–59)

## 2013-08-24 MED ORDER — SODIUM CHLORIDE 0.9 % IV BOLUS (SEPSIS)
1000.0000 mL | Freq: Once | INTRAVENOUS | Status: AC
  Administered 2013-08-24: 1000 mL via INTRAVENOUS

## 2013-08-24 MED ORDER — FAMOTIDINE 20 MG PO TABS: 20.0000 mg | ORAL_TABLET | Freq: Two times a day (BID) | ORAL | Status: DC

## 2013-08-24 MED ORDER — FAMOTIDINE 20 MG PO TABS
20.0000 mg | ORAL_TABLET | Freq: Once | ORAL | Status: AC
  Administered 2013-08-24: 20 mg via ORAL
  Filled 2013-08-24: qty 1

## 2013-08-24 NOTE — ED Notes (Signed)
Pt presents with c/o left upper quadrant intermittent abdominal pain, hx of stomach ulcers in the past. Pt reports to EMS that she has had the abdominal pain all day since 1:00 and has been getting progressively worse. IV 22 g left hand placed by EMS, 4 of zofran given en route by EMS.

## 2013-08-24 NOTE — ED Provider Notes (Signed)
CSN: [id number]     Arrival date & time 08/24/13  2103 History   First MD Initiated Contact with Patient 08/24/13 2126     Chief Complaint  Patient presents with  . Abdominal Pain     (Consider location/radiation/quality/duration/timing/severity/associated sxs/prior Treatment) HPI Comments: 58 year old female presents with intermittent upper abdominal pain over the last 24 hours. She states it feels like the time she's had stomach ulcers. 3 years ago she was admitted to the hospital and had an EGD that showed multiple stomach ulcers. She denies any current melena or hematochezia. She has had some nausea but no vomiting. She states normally she does take Phenergan her pain seems resolved. She was given IV Phenergan per the patient by EMS states her pain is now significantly improved to an 8/10. When asked what was before she said it was a 10+. The patient states he feels well to go home on arrival. Patient no abdominal distention, diarrhea, or constipation. She's currently on protonix and has not been missing any doses. Has not been taking NSAIDs   Past Medical History  Diagnosis Date  . Hypertension   . Hyperlipemia   . Shortness of breath   . Blood transfusion   . Headache(784.0)   . GERD (gastroesophageal reflux disease)   . Anemia   . Asthma    Past Surgical History  Procedure Laterality Date  . Back surgery    . Brain surgery    . Abdominal hysterectomy    . Esophagogastroduodenoscopy  05/17/2011    Procedure: ESOPHAGOGASTRODUODENOSCOPY (EGD);  Surgeon: Beryle Beams;  Location: [facility] ENDOSCOPY;  Service: Endoscopy;  Laterality: N/A;  . Shoulder surgery     Family History  Problem Relation Age of Onset  . Colon cancer Father    History  Substance Use Topics  . Smoking status: Current Every Day Smoker -- 0.50 packs/day for 30 years    Last Attempt to Quit: 08/01/2012  . Smokeless tobacco: Current User     Comment: electronic cigs only  . Alcohol Use: No   OB History    Grav Para Term Preterm Abortions TAB SAB Ect Mult Living                 Review of Systems  Constitutional: Negative for fever.  Gastrointestinal: Positive for abdominal pain. Negative for nausea, vomiting, diarrhea, constipation and blood in stool.  Genitourinary: Negative for dysuria.  Musculoskeletal: Negative for back pain.  All other systems reviewed and are negative.      Allergies  Latex  Home Medications   Current Outpatient Rx  Name  Route  Sig  Dispense  Refill  . acetaminophen (TYLENOL) 650 MG CR tablet   Oral   Take 1,300 mg by mouth every 8 (eight) hours as needed for pain.         Marland Kitchen albuterol (PROVENTIL HFA;VENTOLIN HFA) 108 (90 BASE) MCG/ACT inhaler   Inhalation   Inhale 2 puffs into the lungs every 6 (six) hours as needed for wheezing or shortness of breath.         Marland Kitchen albuterol (PROVENTIL) (2.5 MG/3ML) 0.083% nebulizer solution   Nebulization   Take 3 mLs (2.5 mg total) by nebulization every 6 (six) hours as needed for wheezing.   360 mL   6   . aspirin 81 MG chewable tablet   Oral   Chew 81 mg by mouth daily.          . beclomethasone (QVAR) 80 MCG/ACT inhaler   Inhalation  Inhale 1 puff into the lungs 2 (two) times daily.         . butalbital-acetaminophen-caffeine (FIORICET, ESGIC) 50-325-40 MG per tablet   Oral   Take 1 tablet by mouth daily as needed. For headaches           . cyclobenzaprine (FLEXERIL) 10 MG tablet   Oral   Take 10 mg by mouth 3 (three) times daily as needed for muscle spasms.          . diazepam (VALIUM) 5 MG tablet   Oral   Take 5 mg by mouth every 6 (six) hours as needed for anxiety.         . DULoxetine (CYMBALTA) 60 MG capsule   Oral   Take 60 mg by mouth daily.           . fentaNYL (DURAGESIC - DOSED MCG/HR) 100 MCG/HR   Transdermal   Place 1 patch onto the skin every 3 (three) days. Sig states every 2 days         . Fluticasone-Salmeterol (ADVAIR DISKUS) 250-50 MCG/DOSE AEPB    Inhalation   Inhale 1 puff into the lungs 2 (two) times daily.   60 each   5   . gabapentin (NEURONTIN) 300 MG capsule   Oral   Take 300 mg by mouth 2 (two) times daily.          Marland Kitchen HYDROmorphone (DILAUDID) 4 MG tablet   Oral   Take 4 mg by mouth every 4 (four) hours as needed for pain.         . Incontinence Supply Disposable (BLADDER CONTROL PADS EX ABSORB) MISC   Transdermal   Place 1 Package onto the skin daily.          . Lactobacillus ([place] PRO-B) CAPS   Oral   Take 1 capsule by mouth every morning.         . pantoprazole (PROTONIX) 40 MG tablet   Oral   Take 1 tablet (40 mg total) by mouth 2 (two) times daily before a meal.   30 tablet   0   . pentosan polysulfate (ELMIRON) 100 MG capsule   Oral   Take 200 mg by mouth 2 (two) times daily.           . promethazine (PHENERGAN) 12.5 MG tablet   Oral   Take 12.5 mg by mouth every 6 (six) hours as needed for nausea.         . rosuvastatin (CRESTOR) 10 MG tablet   Oral   Take 10 mg by mouth daily.         Marland Kitchen Spacer/Aero-Holding Chambers (AEROCHAMBER MV) inhaler      Use as instructed   1 each   0   . SPIRIVA HANDIHALER 18 MCG inhalation capsule   Inhalation   Place 18 mcg into inhaler and inhale daily.          . famotidine (PEPCID) 20 MG tablet   Oral   Take 1 tablet (20 mg total) by mouth 2 (two) times daily.   30 tablet   0   . levofloxacin (LEVAQUIN) 500 MG tablet   Oral   Take 500 mg by mouth daily.           BP 116/54  Pulse 87  Temp(Src) 98.6 F (37 C) (Oral)  Resp 20  SpO2 93% Physical Exam  Nursing note and vitals reviewed. Constitutional: She is oriented to person, place, and time. She appears  well-developed and well-nourished. No distress.  HENT:  Head: Normocephalic and atraumatic.  Right Ear: External ear normal.  Left Ear: External ear normal.  Nose: Nose normal.  Eyes: Right eye exhibits no discharge. Left eye exhibits no discharge.  Cardiovascular: Normal  rate, regular rhythm and normal heart sounds.   Pulmonary/Chest: Effort normal and breath sounds normal.  Abdominal: Soft. She exhibits no distension. There is tenderness (Mild) in the epigastric area.  Neurological: She is alert and oriented to person, place, and time.  Skin: Skin is warm and dry.    ED Course  Procedures (including critical care time) Labs Review Labs Reviewed  CBC WITH DIFFERENTIAL - Abnormal; Notable for the following:    WBC 10.9 (*)    Neutrophils Relative % 86 (*)    Neutro Abs 9.4 (*)    Lymphocytes Relative 10 (*)    All other components within normal limits  COMPREHENSIVE METABOLIC PANEL - Abnormal; Notable for the following:    Glucose, Bld 114 (*)    All other components within normal limits  LIPASE, BLOOD  URINALYSIS, ROUTINE W REFLEX MICROSCOPIC  POC URINE PREG, ED   Imaging Review No results found.  EKG Interpretation   None       MDM   Final diagnoses:  Abdominal pain  PUD (peptic ulcer disease)    Patient is well-appearing with a normal hemoglobin, normal LFTs and no signs of pancreatitis. She has no abdominal distention and only mild tenderness. I do not feel she needs imaging at this point. I feel that her pain is related to her peptic ulcer disease, without active vomiting, bloody stools, or significant pain thereafter she is to be worked up as an inpatient. We'll refer her back to her gastroenterologist. Burnis Medin add on Pepcid in addition to her PPI. Discussed strict return precautions.    Ephraim Hamburger, MD 08/24/13 [phone]

## 2013-08-24 NOTE — Progress Notes (Signed)
Patient ID: Yolanda Yang, female   DOB: 1955-09-15, 58 y.o.   MRN: [id number]  Subjective: This patient presents for ongoing debridement of painful mycotic toenails. The last visit for this service was 02/17/2013. She has been a patient in this practice since 2012.  Objective: Elongated, hypertrophic, discolored toenails with palpable tenderness in all 10 nail plates.  Assessment: Symptomatic onychomycoses x10  Plan: Nails x10 are debrided back without a bleeding. Reappoint at three-month intervals.

## 2013-08-24 NOTE — Discharge Instructions (Signed)
Abdominal Pain, Women °Abdominal (stomach, pelvic, or belly) pain can be caused by many things. It is important to tell your doctor: °· The location of the pain. °· Does it come and go or is it present all the time? °· Are there things that start the pain (eating certain foods, exercise)? °· Are there other symptoms associated with the pain (fever, nausea, vomiting, diarrhea)? °All of this is helpful to know when trying to find the cause of the pain. °CAUSES  °· Stomach: virus or bacteria infection, or ulcer. °· Intestine: appendicitis (inflamed appendix), regional ileitis (Crohn's disease), ulcerative colitis (inflamed colon), irritable bowel syndrome, diverticulitis (inflamed diverticulum of the colon), or cancer of the stomach or intestine. °· Gallbladder disease or stones in the gallbladder. °· Kidney disease, kidney stones, or infection. °· Pancreas infection or cancer. °· Fibromyalgia (pain disorder). °· Diseases of the female organs: °· Uterus: fibroid (non-cancerous) tumors or infection. °· Fallopian tubes: infection or tubal pregnancy. °· Ovary: cysts or tumors. °· Pelvic adhesions (scar tissue). °· Endometriosis (uterus lining tissue growing in the pelvis and on the pelvic organs). °· Pelvic congestion syndrome (female organs filling up with blood just before the menstrual period). °· Pain with the menstrual period. °· Pain with ovulation (producing an egg). °· Pain with an IUD (intrauterine device, birth control) in the uterus. °· Cancer of the female organs. °· Functional pain (pain not caused by a disease, may improve without treatment). °· Psychological pain. °· Depression. °DIAGNOSIS  °Your doctor will decide the seriousness of your pain by doing an examination. °· Blood tests. °· X-rays. °· Ultrasound. °· CT scan (computed tomography, special type of X-ray). °· MRI (magnetic resonance imaging). °· Cultures, for infection. °· Barium enema (dye inserted in the large intestine, to better view it with  X-rays). °· Colonoscopy (looking in intestine with a lighted tube). °· Laparoscopy (minor surgery, looking in abdomen with a lighted tube). °· Major abdominal exploratory surgery (looking in abdomen with a large incision). °TREATMENT  °The treatment will depend on the cause of the pain.  °· Many cases can be observed and treated at home. °· Over-the-counter medicines recommended by your caregiver. °· Prescription medicine. °· Antibiotics, for infection. °· Birth control pills, for painful periods or for ovulation pain. °· Hormone treatment, for endometriosis. °· Nerve blocking injections. °· Physical therapy. °· Antidepressants. °· Counseling with a psychologist or psychiatrist. °· Minor or major surgery. °HOME CARE INSTRUCTIONS  °· Do not take laxatives, unless directed by your caregiver. °· Take over-the-counter pain medicine only if ordered by your caregiver. Do not take aspirin because it can cause an upset stomach or bleeding. °· Try a clear liquid diet (broth or water) as ordered by your caregiver. Slowly move to a bland diet, as tolerated, if the pain is related to the stomach or intestine. °· Have a thermometer and take your temperature several times a day, and record it. °· Bed rest and sleep, if it helps the pain. °· Avoid sexual intercourse, if it causes pain. °· Avoid stressful situations. °· Keep your follow-up appointments and tests, as your caregiver orders. °· If the pain does not go away with medicine or surgery, you may try: °· Acupuncture. °· Relaxation exercises (yoga, meditation). °· Group therapy. °· Counseling. °SEEK MEDICAL CARE IF:  °· You notice certain foods cause stomach pain. °· Your home care treatment is not helping your pain. °· You need stronger pain medicine. °· You want your IUD removed. °· You feel faint or   lightheaded. °· You develop nausea and vomiting. °· You develop a rash. °· You are having side effects or an allergy to your medicine. °SEEK IMMEDIATE MEDICAL CARE IF:  °· Your  pain does not go away or gets worse. °· You have a fever. °· Your pain is felt only in portions of the abdomen. The right side could possibly be appendicitis. The left lower portion of the abdomen could be colitis or diverticulitis. °· You are passing blood in your stools (bright red or black tarry stools, with or without vomiting). °· You have blood in your urine. °· You develop chills, with or without a fever. °· You pass out. °MAKE SURE YOU:  °· Understand these instructions. °· Will watch your condition. °· Will get help right away if you are not doing well or get worse. °Document Released: 04/14/2007 Document Revised: 09/09/2011 Document Reviewed: 05/04/2009 °Exit[employer]® Patient Information ©2014 [employer]. ° °

## 2013-08-24 NOTE — ED Notes (Signed)
MD at bedside. 

## 2013-09-02 ENCOUNTER — Ambulatory Visit: Payer: Medicare PPO | Attending: Sports Medicine | Admitting: Rehabilitative and Restorative Service Providers"

## 2013-09-02 DIAGNOSIS — IMO0001 Reserved for inherently not codable concepts without codable children: Secondary | ICD-10-CM | POA: Diagnosis not present

## 2013-09-02 DIAGNOSIS — M25519 Pain in unspecified shoulder: Secondary | ICD-10-CM | POA: Insufficient documentation

## 2013-09-02 DIAGNOSIS — M6281 Muscle weakness (generalized): Secondary | ICD-10-CM | POA: Diagnosis not present

## 2013-09-07 ENCOUNTER — Encounter: Payer: Medicare PPO | Admitting: Rehabilitation

## 2013-09-08 ENCOUNTER — Encounter: Payer: Medicare PPO | Admitting: Physical Therapy

## 2013-09-14 ENCOUNTER — Ambulatory Visit: Payer: Medicare PPO | Admitting: Physical Therapy

## 2013-09-14 DIAGNOSIS — IMO0001 Reserved for inherently not codable concepts without codable children: Secondary | ICD-10-CM | POA: Diagnosis not present

## 2013-09-16 ENCOUNTER — Ambulatory Visit: Payer: Medicare PPO | Admitting: Physical Therapy

## 2013-09-16 DIAGNOSIS — IMO0001 Reserved for inherently not codable concepts without codable children: Secondary | ICD-10-CM | POA: Diagnosis not present

## 2013-09-21 ENCOUNTER — Ambulatory Visit: Payer: Medicare PPO | Admitting: Physical Therapy

## 2013-09-21 DIAGNOSIS — IMO0001 Reserved for inherently not codable concepts without codable children: Secondary | ICD-10-CM | POA: Diagnosis not present

## 2013-09-23 ENCOUNTER — Encounter: Payer: Medicare PPO | Admitting: Physical Therapy

## 2013-09-28 ENCOUNTER — Ambulatory Visit: Payer: Medicare PPO | Admitting: Physical Therapy

## 2013-09-28 DIAGNOSIS — IMO0001 Reserved for inherently not codable concepts without codable children: Secondary | ICD-10-CM | POA: Diagnosis not present

## 2013-09-30 ENCOUNTER — Ambulatory Visit: Payer: Medicare PPO | Attending: Sports Medicine | Admitting: Physical Therapy

## 2013-09-30 DIAGNOSIS — M25519 Pain in unspecified shoulder: Secondary | ICD-10-CM | POA: Insufficient documentation

## 2013-09-30 DIAGNOSIS — M6281 Muscle weakness (generalized): Secondary | ICD-10-CM | POA: Insufficient documentation

## 2013-09-30 DIAGNOSIS — IMO0001 Reserved for inherently not codable concepts without codable children: Secondary | ICD-10-CM | POA: Insufficient documentation

## 2013-10-07 ENCOUNTER — Encounter: Payer: Medicare PPO | Admitting: Physical Therapy

## 2013-10-12 ENCOUNTER — Ambulatory Visit: Payer: Medicare PPO | Attending: Internal Medicine | Admitting: Physical Therapy

## 2013-10-12 DIAGNOSIS — M25519 Pain in unspecified shoulder: Secondary | ICD-10-CM | POA: Diagnosis not present

## 2013-10-12 DIAGNOSIS — Z5189 Encounter for other specified aftercare: Secondary | ICD-10-CM | POA: Diagnosis present

## 2013-10-12 DIAGNOSIS — M6281 Muscle weakness (generalized): Secondary | ICD-10-CM | POA: Diagnosis not present

## 2013-10-14 ENCOUNTER — Ambulatory Visit: Payer: Medicare PPO | Admitting: Physical Therapy

## 2013-10-28 ENCOUNTER — Ambulatory Visit: Payer: Medicare PPO | Admitting: Rehabilitation

## 2013-11-10 ENCOUNTER — Encounter: Payer: Self-pay | Admitting: Podiatry

## 2013-11-10 ENCOUNTER — Ambulatory Visit (INDEPENDENT_AMBULATORY_CARE_PROVIDER_SITE_OTHER): Payer: Commercial Managed Care - HMO | Admitting: Podiatry

## 2013-11-10 VITALS — BP 125/76 | HR 84 | Resp 12

## 2013-11-10 DIAGNOSIS — B351 Tinea unguium: Secondary | ICD-10-CM

## 2013-11-10 DIAGNOSIS — M79609 Pain in unspecified limb: Secondary | ICD-10-CM

## 2013-11-11 NOTE — Progress Notes (Signed)
Patient ID: Yolanda Yang, female   DOB: 1956/04/02, 58 y.o.   MRN: [id number]  Subjective: This patient presents for ongoing debridement of painful mycotic toenails. The last visit for this service was 08/24/2013.Marland Kitchen She has been a patient in this practice since 2012.   Objective: Elongated, hypertrophic, discolored toenails with palpable tenderness in all 10 nail plates.   Assessment: Symptomatic onychomycoses x10   Plan: Nails x10 are debrided back without a bleeding. Reappoint at three-month intervals.

## 2013-11-15 ENCOUNTER — Ambulatory Visit: Payer: Commercial Managed Care - HMO | Admitting: Podiatry

## 2014-01-24 DIAGNOSIS — M542 Cervicalgia: Secondary | ICD-10-CM | POA: Insufficient documentation

## 2014-01-24 DIAGNOSIS — Z981 Arthrodesis status: Secondary | ICD-10-CM | POA: Insufficient documentation

## 2014-02-25 ENCOUNTER — Encounter: Payer: Self-pay | Admitting: Internal Medicine

## 2014-02-25 ENCOUNTER — Ambulatory Visit (INDEPENDENT_AMBULATORY_CARE_PROVIDER_SITE_OTHER): Payer: Commercial Managed Care - HMO | Admitting: Internal Medicine

## 2014-02-25 VITALS — BP 140/92 | HR 83 | Ht 65.0 in | Wt 185.0 lb

## 2014-02-25 DIAGNOSIS — R0989 Other specified symptoms and signs involving the circulatory and respiratory systems: Secondary | ICD-10-CM

## 2014-02-25 DIAGNOSIS — R0600 Dyspnea, unspecified: Secondary | ICD-10-CM

## 2014-02-25 DIAGNOSIS — R0609 Other forms of dyspnea: Secondary | ICD-10-CM

## 2014-02-25 DIAGNOSIS — J45901 Unspecified asthma with (acute) exacerbation: Secondary | ICD-10-CM

## 2014-02-25 DIAGNOSIS — R0689 Other abnormalities of breathing: Secondary | ICD-10-CM

## 2014-02-25 NOTE — Patient Instructions (Addendum)
Glad you are doing well YOu might be in a flare up now due to heat but as discussed per your wish due to side effect concerns   - we will hold off on prednisone per your wish  - if worse go to ER Continue advair 250/50 , 1 puff twice daily and albuterol as needed Please have flu shot in fall when available in community Also we will write you for albuterol nebulizer to be used not more than 2 times a week Continue weight loss  Followup  9 months or sooner if needed

## 2014-02-25 NOTE — Progress Notes (Signed)
Subjective:    Patient ID: Yolanda Yang, female    DOB: 09-23-1955, 58 y.o.   MRN: [id number]  HPI    OV 02/25/2014  Chief Complaint  Patient presents with  . Follow-up    Pt states she was taken off spiriva by allergy doctor. Pt c/o orthopnea and  mid sternal chest pain with activity and rest. Pt denies cough.     Followup asthma with baseline normal spirometry but a moderate persistent regimen  - Last visit was in February 2015. At that time she was doing well. Overall she continues to do well in terms of asthma except for the summer for the last week or so and with a transition in season she is having increased chest tightness, cough and wheezing. She has significant acid reflux and is having associated orthopnea. Spirometry today is essentially normal with an FEV1 of 1.80 the/81% and the ratio 71. But these numbers are reduced compared to her baseline which normally run at 100%. I offered her a short course of prednisone but she is reluctant due to prior side effects  Past medical history review: She is on chronic antibiotics for recurrent urinary tract infection.  Review of Systems  Constitutional: Negative for fever and unexpected weight change.  HENT: Negative for congestion, dental problem, ear pain, nosebleeds, postnasal drip, rhinorrhea, sinus pressure, sneezing, sore throat and trouble swallowing.   Eyes: Negative for redness and itching.  Respiratory: Positive for shortness of breath. Negative for cough, chest tightness and wheezing.   Cardiovascular: Positive for chest pain. Negative for palpitations and leg swelling.  Gastrointestinal: Negative for nausea and vomiting.  Genitourinary: Negative for dysuria.  Musculoskeletal: Negative for joint swelling.  Skin: Negative for rash.  Neurological: Negative for headaches.  Hematological: Does not bruise/bleed easily.  Psychiatric/Behavioral: Negative for dysphoric mood. The patient is not nervous/anxious.   Current  outpatient prescriptions:acetaminophen (TYLENOL) 650 MG CR tablet, Take 1,300 mg by mouth every 8 (eight) hours as needed for pain., Disp: , Rfl: ;  albuterol (PROVENTIL HFA;VENTOLIN HFA) 108 (90 BASE) MCG/ACT inhaler, Inhale 2 puffs into the lungs every 6 (six) hours as needed for wheezing or shortness of breath., Disp: , Rfl:  albuterol (PROVENTIL) (2.5 MG/3ML) 0.083% nebulizer solution, Take 3 mLs (2.5 mg total) by nebulization every 6 (six) hours as needed for wheezing., Disp: 360 mL, Rfl: 6;  aspirin 81 MG chewable tablet, Chew 81 mg by mouth daily. , Disp: , Rfl: ;  butalbital-acetaminophen-caffeine (FIORICET, ESGIC) 50-325-40 MG per tablet, Take 1 tablet by mouth daily as needed. For headaches  , Disp: , Rfl:  clindamycin (CLEOCIN) 300 MG capsule, Take 1 capsule by mouth 2 (two) times daily., Disp: , Rfl: ;  cyclobenzaprine (FLEXERIL) 10 MG tablet, Take 10 mg by mouth 3 (three) times daily as needed for muscle spasms. , Disp: , Rfl: ;  diazepam (VALIUM) 5 MG tablet, Take 5 mg by mouth every 6 (six) hours as needed for anxiety., Disp: , Rfl: ;  DULoxetine (CYMBALTA) 60 MG capsule, Take 60 mg by mouth daily.  , Disp: , Rfl:  fentaNYL (DURAGESIC - DOSED MCG/HR) 100 MCG/HR, Place 1 patch onto the skin every 3 (three) days. Sig states every 2 days, Disp: , Rfl: ;  Fluticasone-Salmeterol (ADVAIR DISKUS) 250-50 MCG/DOSE AEPB, Inhale 1 puff into the lungs 2 (two) times daily., Disp: 60 each, Rfl: 5;  gabapentin (NEURONTIN) 300 MG capsule, Take 300 mg by mouth 2 (two) times daily. , Disp: , Rfl:  HYDROmorphone (  DILAUDID) 4 MG tablet, Take 4 mg by mouth every 4 (four) hours as needed for pain., Disp: , Rfl: ;  Lactobacillus (REPHRESH PRO-B) CAPS, Take 1 capsule by mouth every morning., Disp: , Rfl: ;  levofloxacin (LEVAQUIN) 500 MG tablet, Take 500 mg by mouth daily. , Disp: , Rfl: ;  pantoprazole (PROTONIX) 40 MG tablet, Take 1 tablet (40 mg total) by mouth 2 (two) times daily before a meal., Disp: 30 tablet, Rfl:  0 pentosan polysulfate (ELMIRON) 100 MG capsule, Take 200 mg by mouth 2 (two) times daily.  , Disp: , Rfl: ;  promethazine (PHENERGAN) 12.5 MG tablet, Take 12.5 mg by mouth every 6 (six) hours as needed for nausea., Disp: , Rfl: ;  rosuvastatin (CRESTOR) 10 MG tablet, Take 10 mg by mouth daily., Disp: , Rfl: ;  Spacer/Aero-Holding Chambers (AEROCHAMBER MV) inhaler, Use as instructed, Disp: 1 each, Rfl: 0      Objective:   Physical Exam  Vitals reviewed. Constitutional: She is oriented to person, place, and time. She appears well-developed and well-nourished. No distress.  Body mass index is 30.79 kg/(m^2).   HENT:  Head: Normocephalic and atraumatic.  Right Ear: External ear normal.  Left Ear: External ear normal.  Mouth/Throat: Oropharynx is clear and moist. No oropharyngeal exudate.  Eyes: Conjunctivae and EOM are normal. Pupils are equal, round, and reactive to light. Right eye exhibits no discharge. Left eye exhibits no discharge. No scleral icterus.  Neck: Normal range of motion. Neck supple. No JVD present. No tracheal deviation present. No thyromegaly present.  Cardiovascular: Normal rate, regular rhythm, normal heart sounds and intact distal pulses.  Exam reveals no gallop and no friction rub.   No murmur heard. Pulmonary/Chest: Effort normal and breath sounds normal. No respiratory distress. She has no wheezes. She has no rales. She exhibits no tenderness.  Abdominal: Soft. Bowel sounds are normal. She exhibits no distension and no mass. There is no tenderness. There is no rebound and no guarding.  Musculoskeletal: Normal range of motion. She exhibits no edema and no tenderness.  Lymphadenopathy:    She has no cervical adenopathy.  Neurological: She is alert and oriented to person, place, and time. She has normal reflexes. No cranial nerve deficit. She exhibits normal muscle tone. Coordination normal.  Skin: Skin is warm and dry. No rash noted. She is not diaphoretic. No erythema.  No pallor.  Psychiatric: She has a normal mood and affect. Her behavior is normal. Judgment and thought content normal.    Filed Vitals:   02/25/14 1635  BP: 140/92  Pulse: 83  Height: 5\' 5"  (1.651 m)  Weight: 185 lb (83.915 kg)  SpO2: 95%         Assessment & Plan:  Glad you are doing well YOu might be in a flare up now due to heat but as discussed per your wish due to side effect concerns   - we will hold off on prednisone per your wish  - if worse go to ER Continue advair 250/50 , 1 puff twice daily and albuterol as needed Please have flu shot in fall when available in community Also we will write you for albuterol nebulizer to be used not more than 2 times a week Continue weight loss  Followup  9 months or sooner if needed

## 2014-03-28 ENCOUNTER — Other Ambulatory Visit: Payer: Self-pay | Admitting: Obstetrics and Gynecology

## 2014-03-29 LAB — CYTOLOGY - PAP

## 2014-05-19 ENCOUNTER — Encounter: Payer: Self-pay | Admitting: Pulmonary Disease

## 2014-05-19 ENCOUNTER — Ambulatory Visit (INDEPENDENT_AMBULATORY_CARE_PROVIDER_SITE_OTHER): Payer: Commercial Managed Care - HMO | Admitting: Pulmonary Disease

## 2014-05-19 ENCOUNTER — Encounter (INDEPENDENT_AMBULATORY_CARE_PROVIDER_SITE_OTHER): Payer: Self-pay

## 2014-05-19 VITALS — BP 118/80 | HR 89 | Ht 65.0 in | Wt 191.0 lb

## 2014-05-19 DIAGNOSIS — J019 Acute sinusitis, unspecified: Secondary | ICD-10-CM | POA: Insufficient documentation

## 2014-05-19 DIAGNOSIS — J45901 Unspecified asthma with (acute) exacerbation: Secondary | ICD-10-CM | POA: Insufficient documentation

## 2014-05-19 MED ORDER — PREDNISONE 10 MG PO TABS: ORAL_TABLET | ORAL | Status: DC

## 2014-05-19 MED ORDER — AMOXICILLIN-POT CLAVULANATE 875-125 MG PO TABS: 1.0000 | ORAL_TABLET | Freq: Two times a day (BID) | ORAL | Status: DC

## 2014-05-19 NOTE — Patient Instructions (Signed)
Prednisone 10 mg pills >> 3 pills daily for 2 days, 2 pills daily for 2 days, 1 pill daily Augmentin one pill twice per day for 7 days Call if not feeling better Follow up with Dr. Chase Caller in 4 months

## 2014-05-19 NOTE — Assessment & Plan Note (Signed)
Will give her course of prednisone.  She is to continue advair, mucinex, and prn albuterol.

## 2014-05-19 NOTE — Progress Notes (Signed)
Chief Complaint  Patient presents with  . ACUTE OFFICE VISIT    Pt c/o increased chest congestion, SOB, hoarseness and chest tightness. Currently taking Amox 500mg  and ZPAK. Unable to expel mucus in lungs. Using Nebulizer meds PRN-some relief for short time.    History of Present Illness: Yolanda Yang is a 58 y.o. female with asthma.  She is followed by Dr. Chase Caller.  She developed a sinus infection 3 weeks ago.  She was started on amoxicillin a week ago >> no help.  She has headache, ear popping, sore throat, and yellow drainage.  She gets a cough >> more at night when she lays flat.  She is getting chest rattle and wheeze.  She is using albuterol frequently >> helps, but doesn't last.    She is using flonase, advair, mucinex, nasal irrigation, and allegra.   Past medical hx, Past surgical hx, Medications, Allergies, Family hx, Social hx all reviewed.   Physical Exam:  General - No distress ENT - No sinus tenderness, clear to yellow nasal drainage, boggy TM, mild erythema posterior pharynx, no oral exudate, no LAN Cardiac - s1s2 regular, no murmur Chest - b/l expiratory wheeze Back - No focal tenderness Abd - Soft, non-tender Ext - No edema Neuro - Normal strength Skin - No rashes Psych - normal mood, and behavior   Assessment/Plan:  Yolanda Mires, MD [place] [facility] Pager:  [phone]

## 2014-05-19 NOTE — Assessment & Plan Note (Signed)
Will have her use augmentin for 1 week.  She is to continue flonase, allegra, and nasal irrigation.

## 2014-05-23 ENCOUNTER — Telehealth: Payer: Self-pay | Admitting: Internal Medicine

## 2014-05-23 MED ORDER — LEVOFLOXACIN 500 MG PO TABS: 500.0000 mg | ORAL_TABLET | Freq: Every day | ORAL | Status: DC

## 2014-05-23 MED ORDER — PREDNISONE 10 MG PO TABS: ORAL_TABLET | ORAL | Status: DC

## 2014-05-23 NOTE — Telephone Encounter (Signed)
Change to levaquin 500mg  daily x 5 days  Chagne prednisone to  Please take Take prednisone 40mg  once daily x 3 days, then 30mg  once daily x 3 days, then 20mg  once daily x 3 days, then prednisone 10mg  once daily  x 3 days and stop  If not better, go to ER or come and visit [place] soon  Dr. Brand Males, M.D., [facility].C.P Pulmonary and Critical Care Medicine Staff Physician [place] [facility] Pager: [phone], If no answer or between  15:00h - 7:00h: call [phone]  05/23/2014 4:30 PM

## 2014-05-23 NOTE — Telephone Encounter (Signed)
Called pt and is aware of recs. RX's sent in. Nothing further needed 

## 2014-05-23 NOTE — Telephone Encounter (Signed)
PT seen Dr Halford Chessman on 05/19/14 and was given Augmentin and Pred taper.  Pt has 3 days left of Augmentin and 1 day left of Prednisone.  Had taken course of Amoxicillin from PCP prior to this.  TP states she is not feeling any better.  Still c/o wheezing, chest tightness, hollow feeling cough,  Not sure of fever but is having chills and sweats.  Pt states she is taking her mucinex, allegra, nebs and inhalers.  She is having breast reduction surgery in December and needs to be well for this.  Please advise.

## 2014-05-31 NOTE — H&P (Signed)
Subjective:    Patient ID: Yolanda Yang is a 58 y.o. female.  HPI  Patient with chronic neck and back pain with plan for breast reduction. Current 40 K.Has had cervical fusion and shoulder surgery most recently on right for rotator cuff tear. Has chronic pain and on pain contract (Dr Rosana Hoes, [place] Allegra). Complains of shoulder grooving, inability to lie on back as she feels her chest is strangling her. Symptoms improved if she carries breasts in hands.   Has lost 10 lb over last year. Is currently in PT and continues to have pain. Is on fentanyl patch for pain. Has been specialty fitted for bra with no improvement in pain. No history of rashes. Last MMG 2015, normal. History of lumpectomy left breast, benign. Significant asymmetry in breasts and not able to fit symmetrically in bra.   Since last visit has had f/u MRI and Dr. Candiss Norse has communicated she is stable from aneurysm and stent standpoint. They recommend resuming ASA as soon as possible post op and maintaining normotension during anesthesia. I have responded that she does not need to stop ASA for this surgery given her history.  Interstitial cystitis managed at [facility]. Asthma managed by [facility] pulmonology.  Past Medical History   Past Medical History   Diagnosis  Date   .  Hypercholesterolemia    .  Headache(784.0)    .  Polyradiculitis ([place])    .  Ptosis of left eyelid    .  GERD (gastroesophageal reflux disease)      controlled on H2 blocker   .  Arthritis    .  Anxiety    .  Depression      on Cymbalta   .  Asthma      controlled on Spiriva.    .  Chronic interstitial cystitis      bladder instillations every Wed and Thurs- also on Urelle and Elmiron   .  Aneurysm ([place])  05/2011     Multiple cerebral aneurysms s/p coiling x 2   .  Hypertension      "controlled" off meds   .  Seasonal allergies    .  Right foot drop      s/p back surgery 05/2010; wears brace    .  Right rotator cuff tear  2014   .  Hard to  intubate      possible 2/2 prior cervical fusion- has "broken screw C5-6"   .  Spinal headache    .  Hyperglycemia      borderline DM   .  Transfusion history    .  Snoring  2002     loud snoring following anterior cervical discectomy with fusion; STOP BANG 5   .  Post-menopausal      "hot flashes"   .  Insomnia    .  History of migraine headaches      last occured 1 wk ago   .  Abnormal weight gain      15 lb 4-6 wk's July 2014   .  Bilateral carpal tunnel syndrome      worse on right, splint at night   .  HLD (hyperlipidemia)    .  Heart murmur    .  Interstitial cystitis    .  Foot drop, right      post back surgery   .  Chronic neck and back pain  01/24/2014   .  Status post cervical spinal fusion  01/24/2014  Past Surgical History   Past Surgical History   Procedure  Laterality  Date   .  Lumbar discectomy   05/2010     decompressive "laminectomy" L4-L6   .  Posterior cervical laminectomy w/ fusion   2002     with revision 2004 & broken hardware at C6   .  Cesarean section   1979   .  Hysterectomy   1997     partial   .  Tonsillectomy   1969   .  Breast surgery  Left  2002     lumpectomy   .  Cerebral aneurysm repair   05/2011     stent right neck   .  Vascular surgery   05/2011   .  Total hip replacement - posterior approach   05/01/2012     Procedure: TOTAL HIP REPLACEMENT - POSTERIOR APPROACH; Surgeon: Tyson Alias, MD; Location: [place]; Service: Orthopedics; Laterality: Left;   .  Colonoscopy   2010   .  Total hip replacement  Right  10/2011   .  Right rotator cuff repair 07/06/13     .  Shoulder arthroscopy w/ rotator cuff repair  Right  07/06/2013     Procedure: SHOULDER ARTHROSCOPY W/ ROTATOR CUFF REPAIR; Surgeon: Corky Mull, MD; Location: [place]; Service: Orthopedics; Laterality: Right; LATEX ALLERGY      Current Outpatient Prescriptions on File Prior to Visit   Medication  Sig  Dispense  Refill   .  aspirin 81 MG EC tablet  Take  81 mg by mouth daily.     .  butalbital-acetaminophen-caffeine (FIORICET, ESGIC) 50-325-40 mg per tablet  Take 1 tablet by mouth every 4 (four) hours as needed.     .  cyclobenzaprine (FLEXERIL) 10 MG tablet  Take 10 mg by mouth 3 (three) times daily as needed.     .  diazepam (VALIUM) 5 MG tablet  Take 5 mg by mouth every 12 (twelve) hours as needed for Anxiety.     .  DULoxetine (CYMBALTA) 60 MG capsule  Take 60 mg by mouth daily.     .  fentaNYL (DURAGESIC) 100 mcg/hr patch  Place 1 patch onto the skin every third day.     .  fexofenadine-pseudoephedrine (ALLEGRA-D 24 HOUR) 180-240 mg per 24 hr tablet  Take 1 tablet by mouth daily as needed.     Marland Kitchen  HYDROmorphone (DILAUDID) 4 MG tablet  Take 1-2 tablets (4-8 mg total) by mouth every 4 (four) hours as needed for Pain (1 tab prn moderate pain, 2 tabs prn severe pain).  90 tablet  0   .  lactobacillus rhamnosus, GG, (CULTURELLE) 10 billion cell capsule  Take 1 capsule by mouth daily.     Marland Kitchen  METHEN/M-BLUE/SAL/NA PHOS/HYOS (URELLE ORAL)  Take 1 tablet by mouth.     .  pantoprazole (PROTONIX) 40 MG tablet  Take 40 mg by mouth 2 times daily.     .  pentosan polysulfate (ELMIRON) 100 mg capsule  Take 100 mg by mouth as needed.     .  promethazine (PHENERGAN) 12.5 MG tablet  Take by mouth every 6 (six) hours as needed.     .  rosuvastatin (CRESTOR) 10 MG tablet  Take 10 mg by mouth daily.     Marland Kitchen  tiotropium (SPIRIVA) 18 mcg inhalation capsule  Place 18 mcg into inhaler and inh daily.     Marland Kitchen  gabapentin (NEURONTIN) 600 MG tablet  Take 1  tablet (600 mg total) by mouth 2 times daily.  60 tablet  11   .  [DISCONTINUED] amoxicillin (AMOXIL) 500 MG capsule  Take four capsules one hour prior to procedure.  16 capsule  0   No current facility-administered medications on file prior to visit.    Review of Systems     Objective:    Physical Exam  Neck:  left neck scar flat faded  Cardiovascular: Normal rate and regular rhythm.  Pulmonary/Chest: Effort  normal.  Abdominal: Soft.  Genitourinary: There is breast tenderness.  Left breast << right breast volume Left lateral breast radial scar Grade III ptosis R, Grade II left  SN to nipple R 42.5 L 36 cm BW R 19 L 18 cm Nipple to IMF R 22 L 19 cm    Assessment:   Back, Neck and shoulder pain in setting of Macromastia  Plan:   Unrelieved with pain mediation, PT, wt loss and specialty bras. Reviewed reduction with anchor type scars, plan overnight hospital stay and drains, post operative visits and limitations, recovery. Diminished sensation nipple and breast skin, risk of nipple loss, wound healing problems, asymmetry, incidental carcinoma, changes with wt gain/loss, aging, unacceptable cosmetic appearance reviewed. She is high risk for NAC loss given degree of ptosis and we discussed this specifically today. She reports that her hip surgeons rec oral antibiotic prophylaxis prior to dental procedures. We discussed that she will receive IV antibiotics prior to surgery. She also has interstitial cystitis and takes Levaquin PRN symptoms. States that she will get infection if any difficulty with foley placement; counseled I do not plan for Foley for this procedure.  She is on a pain contract and states her provider aware of surgery and that I can provide pain medication. She takes oral dilaudid baseline; will plan oxycodone in perioperative period and I will not provide narcotics after 4 weeks post op.  She notes today that her last surgery was canceled as it was in OP surgery center and there was difficulty with intubation given cervical fusion. Given this information, we have rescheduled her surgery to [facility] OR and will ask for anesthesiology consult preop.   Anticipate 527 g reduction from each breast.   Irene Limbo, MD [facility]astic [facility] Re[facility] Surgery [phone]

## 2014-06-06 ENCOUNTER — Other Ambulatory Visit: Payer: Self-pay | Admitting: Internal Medicine

## 2014-06-08 ENCOUNTER — Encounter (HOSPITAL_COMMUNITY): Payer: Self-pay | Admitting: Vascular Surgery

## 2014-06-08 MED ORDER — CEFAZOLIN SODIUM-DEXTROSE 2-3 GM-% IV SOLR
2.0000 g | INTRAVENOUS | Status: AC
  Administered 2014-06-09: 2 g via INTRAVENOUS
  Filled 2014-06-08: qty 50

## 2014-06-08 MED ORDER — CHLORHEXIDINE GLUCONATE 4 % EX LIQD
1.0000 "application " | Freq: Once | CUTANEOUS | Status: DC
  Filled 2014-06-08: qty 15

## 2014-06-08 NOTE — Progress Notes (Signed)
Pt denies SOB and chest pain and being under the care of a cardiologist. Pt stated that she had a stress test > 5 years ago but cannot recall where. Pt stated that she had a cervical fusion in 2002 and a cervical revision in 2004. In 2012,  she had surgery for a brain aneurysm, she made the doctors aware at [facility] that she has LROM in her neck due to the hardware placed from the cervical fusions. She denies having an actual event of difficult intubation but stated that " the doctors took my word and made it a permanent part of my records." Pt chart forwarded to [place], [place] (anesthesia) for consult order.

## 2014-06-08 NOTE — Progress Notes (Signed)
Anesthesia Chart Review:  Patient is a 58 year old female scheduled for bilateral mammary reduction on 06/09/14 by Dr. Iran Planas.  Case is scheduled as a same day work-up.  It appears she was initially scheduled at [facility] day surgery, but the surgeon moved it to the main OR because patient reported "DIFFICULT INTUBATION" history.  Case was booked last week at [facility], but apparently there were no PAT visits available.   History includes former smoker, HTN, HLD, GERD, SOB, asthma, interstitial cystitis, anemia, L3-5 laminectomies '05/17/10, C4-7 ACDF 11/28/00, cerebral aneurysm coiling '12. PCP is listed as Dr. Iona Beard.  Pulmonologist is Dr. Chase Caller.    Anesthesia history: She reported being told she was a potential for difficult intubation due to her prior neck fusion in 2002.  I did print her last anesthesia records with [place] from 05/17/10 (scanned under Media tab, but I also printed a copy for her hard chart).  She was not noted to be a difficult intubation at that time. IV induction. #3 MAC blade was used to insert a 8.0 oral ETT. One attempt.  According to Dr. Para Skeans 05/31/14 note, "Since last visit has had f/u MRI and Dr. Candiss Norse has communicated she is stable from aneurysm and stent standpoint. They recommend resuming ASA as soon as possible post op and maintaining normotension during anesthesia. I have responded that she does not need to stop ASA for this surgery given her history."  Cardiopulmonary exercise test 06/08/13: Conclusion: Exercise testing with gas exchange demonstrates a low normal functional capacity when compared to matched sedentary norms. There was a borderline restrictive response to the exercise, but no overt cardiac or ventilatory limitation to the exercise. Post-exercise spirometry demonstrates an EIB response.  PFTs 02/25/14: FVC 2.59 (92%), FEV1 1.83 (81%). Normal spirometry.  She will need labs and an EKG on arrival, unless more recent results received. She will  be further evaluated by her assigned anesthesiologist at that time to discuss the definitve anesthesia plan.  George Hugh [facility] Short Stay Center/Anesthesiology Phone [phone] 06/08/2014 1:41 PM

## 2014-06-09 ENCOUNTER — Encounter (HOSPITAL_COMMUNITY)
Admission: RE | Disposition: A | Discharge status: Home / Self Care | Payer: Self-pay | Source: Ambulatory Visit | Attending: Plastic Surgery

## 2014-06-09 ENCOUNTER — Inpatient Hospital Stay (HOSPITAL_COMMUNITY): Payer: Commercial Managed Care - HMO | Admitting: Anesthesiology

## 2014-06-09 ENCOUNTER — Observation Stay (HOSPITAL_COMMUNITY)
Admission: RE | Admit: 2014-06-09 | Discharge: 2014-06-10 | Disposition: A | Discharge status: Home / Self Care | Payer: Commercial Managed Care - HMO | Source: Ambulatory Visit | Attending: Plastic Surgery | Admitting: Plastic Surgery

## 2014-06-09 ENCOUNTER — Encounter (HOSPITAL_COMMUNITY): Payer: Self-pay | Admitting: *Deleted

## 2014-06-09 DIAGNOSIS — G8929 Other chronic pain: Secondary | ICD-10-CM | POA: Diagnosis not present

## 2014-06-09 DIAGNOSIS — N6489 Other specified disorders of breast: Secondary | ICD-10-CM | POA: Insufficient documentation

## 2014-06-09 DIAGNOSIS — N62 Hypertrophy of breast: Secondary | ICD-10-CM | POA: Diagnosis present

## 2014-06-09 DIAGNOSIS — F419 Anxiety disorder, unspecified: Secondary | ICD-10-CM | POA: Diagnosis not present

## 2014-06-09 DIAGNOSIS — Z96649 Presence of unspecified artificial hip joint: Secondary | ICD-10-CM | POA: Diagnosis not present

## 2014-06-09 DIAGNOSIS — Z7982 Long term (current) use of aspirin: Secondary | ICD-10-CM | POA: Insufficient documentation

## 2014-06-09 DIAGNOSIS — E780 Pure hypercholesterolemia: Secondary | ICD-10-CM | POA: Insufficient documentation

## 2014-06-09 DIAGNOSIS — E785 Hyperlipidemia, unspecified: Secondary | ICD-10-CM | POA: Diagnosis not present

## 2014-06-09 DIAGNOSIS — Z87891 Personal history of nicotine dependence: Secondary | ICD-10-CM | POA: Diagnosis not present

## 2014-06-09 DIAGNOSIS — F329 Major depressive disorder, single episode, unspecified: Secondary | ICD-10-CM | POA: Insufficient documentation

## 2014-06-09 DIAGNOSIS — M549 Dorsalgia, unspecified: Secondary | ICD-10-CM | POA: Insufficient documentation

## 2014-06-09 DIAGNOSIS — K219 Gastro-esophageal reflux disease without esophagitis: Secondary | ICD-10-CM | POA: Insufficient documentation

## 2014-06-09 DIAGNOSIS — N3010 Interstitial cystitis (chronic) without hematuria: Secondary | ICD-10-CM | POA: Insufficient documentation

## 2014-06-09 DIAGNOSIS — M542 Cervicalgia: Secondary | ICD-10-CM | POA: Insufficient documentation

## 2014-06-09 DIAGNOSIS — M25511 Pain in right shoulder: Secondary | ICD-10-CM | POA: Insufficient documentation

## 2014-06-09 DIAGNOSIS — J45909 Unspecified asthma, uncomplicated: Secondary | ICD-10-CM | POA: Insufficient documentation

## 2014-06-09 DIAGNOSIS — G43909 Migraine, unspecified, not intractable, without status migrainosus: Secondary | ICD-10-CM | POA: Diagnosis not present

## 2014-06-09 DIAGNOSIS — Z7951 Long term (current) use of inhaled steroids: Secondary | ICD-10-CM | POA: Insufficient documentation

## 2014-06-09 DIAGNOSIS — I10 Essential (primary) hypertension: Secondary | ICD-10-CM | POA: Diagnosis not present

## 2014-06-09 DIAGNOSIS — M25512 Pain in left shoulder: Secondary | ICD-10-CM | POA: Diagnosis not present

## 2014-06-09 HISTORY — DX: Interstitial cystitis (chronic) without hematuria: N30.10

## 2014-06-09 HISTORY — DX: Irritable bowel syndrome, unspecified: K58.9

## 2014-06-09 HISTORY — PX: BREAST REDUCTION SURGERY: SHX8

## 2014-06-09 HISTORY — DX: Anxiety disorder, unspecified: F41.9

## 2014-06-09 LAB — BASIC METABOLIC PANEL
ANION GAP: 11 (ref 5–15)
BUN: 5 mg/dL — ABNORMAL LOW (ref 6–23)
CALCIUM: 9 mg/dL (ref 8.4–10.5)
CHLORIDE: 102 meq/L (ref 96–112)
CO2: 27 meq/L (ref 19–32)
Creatinine, Ser: 0.58 mg/dL (ref 0.50–1.10)
GFR calc Af Amer: >90 mL/min (ref >90)
GFR calc non Af Amer: >90 mL/min (ref >90)
GLUCOSE: 93 mg/dL (ref 70–99)
Potassium: 3.5 mEq/L — ABNORMAL LOW (ref 3.7–5.3)
Sodium: 140 mEq/L (ref 137–147)

## 2014-06-09 LAB — CBC WITH DIFFERENTIAL/PLATELET
BASOS ABS: 0 10*3/uL (ref 0.0–0.1)
Basophils Relative: 0 % (ref 0–1)
EOS PCT: 3 % (ref 0–5)
Eosinophils Absolute: 0.2 10*3/uL (ref 0.0–0.7)
HEMATOCRIT: 34.5 % — AB (ref 36.0–46.0)
Hemoglobin: 11 g/dL — ABNORMAL LOW (ref 12.0–15.0)
LYMPHS ABS: 3.6 10*3/uL (ref 0.7–4.0)
LYMPHS PCT: 57 % — AB (ref 12–46)
MCH: 28.9 pg (ref 26.0–34.0)
MCHC: 31.9 g/dL (ref 30.0–36.0)
MCV: 90.8 fL (ref 78.0–100.0)
Monocytes Absolute: 0.4 10*3/uL (ref 0.1–1.0)
Monocytes Relative: 7 % (ref 3–12)
NEUTROS ABS: 2.1 10*3/uL (ref 1.7–7.7)
Neutrophils Relative %: 33 % — ABNORMAL LOW (ref 43–77)
Platelets: 319 10*3/uL (ref 150–400)
RBC: 3.8 MIL/uL — AB (ref 3.87–5.11)
RDW: 14.8 % (ref 11.5–15.5)
WBC: 6.3 10*3/uL (ref 4.0–10.5)

## 2014-06-09 SURGERY — MAMMOPLASTY, REDUCTION
Anesthesia: General | Site: Breast | Laterality: Bilateral

## 2014-06-09 MED ORDER — ONDANSETRON HCL 4 MG/2ML IJ SOLN
INTRAMUSCULAR | Status: DC | PRN
  Administered 2014-06-09: 4 mg via INTRAVENOUS

## 2014-06-09 MED ORDER — KETOROLAC TROMETHAMINE 15 MG/ML IJ SOLN
15.0000 mg | Freq: Three times a day (TID) | INTRAMUSCULAR | Status: DC
  Administered 2014-06-10 (×2): 15 mg via INTRAVENOUS
  Filled 2014-06-09 (×5): qty 1

## 2014-06-09 MED ORDER — KETOROLAC TROMETHAMINE 15 MG/ML IJ SOLN
15.0000 mg | Freq: Three times a day (TID) | INTRAMUSCULAR | Status: DC
  Administered 2014-06-09: 15 mg via INTRAVENOUS

## 2014-06-09 MED ORDER — ROCURONIUM BROMIDE 100 MG/10ML IV SOLN
INTRAVENOUS | Status: DC | PRN
  Administered 2014-06-09: 40 mg via INTRAVENOUS
  Administered 2014-06-09 (×3): 10 mg via INTRAVENOUS
  Administered 2014-06-09: 5 mg via INTRAVENOUS

## 2014-06-09 MED ORDER — MORPHINE SULFATE 2 MG/ML IJ SOLN
2.0000 mg | INTRAMUSCULAR | Status: DC | PRN
  Administered 2014-06-09 (×2): 4 mg via INTRAVENOUS
  Filled 2014-06-09: qty 2

## 2014-06-09 MED ORDER — PENTOSAN POLYSULFATE SODIUM 100 MG PO CAPS
100.0000 mg | ORAL_CAPSULE | Freq: Two times a day (BID) | ORAL | Status: DC
  Administered 2014-06-09 – 2014-06-10 (×2): 100 mg via ORAL
  Filled 2014-06-09 (×3): qty 1

## 2014-06-09 MED ORDER — HYDROMORPHONE HCL 1 MG/ML IJ SOLN
INTRAMUSCULAR | Status: AC
  Filled 2014-06-09: qty 1

## 2014-06-09 MED ORDER — FENTANYL CITRATE 0.05 MG/ML IJ SOLN
INTRAMUSCULAR | Status: AC
  Filled 2014-06-09: qty 5

## 2014-06-09 MED ORDER — ACETAMINOPHEN 10 MG/ML IV SOLN
INTRAVENOUS | Status: AC
Start: 2014-06-09 — End: 2014-06-09
  Filled 2014-06-09: qty 100

## 2014-06-09 MED ORDER — ROSUVASTATIN CALCIUM 10 MG PO TABS
10.0000 mg | ORAL_TABLET | Freq: Every day | ORAL | Status: DC
  Administered 2014-06-10: 10 mg via ORAL
  Filled 2014-06-09: qty 1

## 2014-06-09 MED ORDER — URIBEL 118 MG PO CAPS: 2.0000 | ORAL_CAPSULE | Freq: Two times a day (BID) | ORAL | Status: DC | PRN

## 2014-06-09 MED ORDER — ACETAMINOPHEN 325 MG PO TABS
325.0000 mg | ORAL_TABLET | ORAL | Status: DC | PRN
Start: 2014-06-09 — End: 2014-06-09

## 2014-06-09 MED ORDER — PROPOFOL 10 MG/ML IV BOLUS
INTRAVENOUS | Status: AC
Start: 2014-06-09 — End: 2014-06-09
  Filled 2014-06-09: qty 20

## 2014-06-09 MED ORDER — HYDROMORPHONE HCL 1 MG/ML IJ SOLN
0.2500 mg | INTRAMUSCULAR | Status: DC | PRN
  Administered 2014-06-09 (×4): 0.5 mg via INTRAVENOUS

## 2014-06-09 MED ORDER — GLYCOPYRROLATE 0.2 MG/ML IJ SOLN
INTRAMUSCULAR | Status: DC | PRN
  Administered 2014-06-09: .4 mg via INTRAVENOUS

## 2014-06-09 MED ORDER — CYCLOBENZAPRINE HCL 10 MG PO TABS: 10.0000 mg | ORAL_TABLET | Freq: Three times a day (TID) | ORAL | Status: DC | PRN

## 2014-06-09 MED ORDER — ONDANSETRON HCL 4 MG PO TABS: 4.0000 mg | ORAL_TABLET | Freq: Four times a day (QID) | ORAL | Status: DC | PRN

## 2014-06-09 MED ORDER — URELLE 81 MG PO TABS
2.0000 | ORAL_TABLET | Freq: Two times a day (BID) | ORAL | Status: DC | PRN
  Filled 2014-06-09: qty 2

## 2014-06-09 MED ORDER — PROPOFOL 10 MG/ML IV BOLUS
INTRAVENOUS | Status: DC | PRN
  Administered 2014-06-09: 30 mg via INTRAVENOUS
  Administered 2014-06-09: 40 mg via INTRAVENOUS
  Administered 2014-06-09: 20 mg via INTRAVENOUS
  Administered 2014-06-09: 100 mg via INTRAVENOUS

## 2014-06-09 MED ORDER — GUAIFENESIN ER 600 MG PO TB12: 600.0000 mg | ORAL_TABLET | Freq: Two times a day (BID) | ORAL | Status: DC | PRN

## 2014-06-09 MED ORDER — KCL IN DEXTROSE-NACL 20-5-0.45 MEQ/L-%-% IV SOLN
INTRAVENOUS | Status: DC
  Administered 2014-06-09: 22:00:00 via INTRAVENOUS
  Filled 2014-06-09 (×3): qty 1000

## 2014-06-09 MED ORDER — ONDANSETRON HCL 4 MG/2ML IJ SOLN: 4.0000 mg | Freq: Four times a day (QID) | INTRAMUSCULAR | Status: DC | PRN

## 2014-06-09 MED ORDER — FENTANYL CITRATE 0.05 MG/ML IJ SOLN
INTRAMUSCULAR | Status: AC
Start: 2014-06-09 — End: 2014-06-09
  Filled 2014-06-09: qty 5

## 2014-06-09 MED ORDER — NEOSTIGMINE METHYLSULFATE 10 MG/10ML IV SOLN
INTRAVENOUS | Status: DC | PRN
Start: 2014-06-09 — End: 2014-06-09
  Administered 2014-06-09: 3 mg via INTRAVENOUS

## 2014-06-09 MED ORDER — ROCURONIUM BROMIDE 50 MG/5ML IV SOLN
INTRAVENOUS | Status: AC
  Filled 2014-06-09: qty 1

## 2014-06-09 MED ORDER — LACTATED RINGERS IV SOLN
INTRAVENOUS | Status: DC | PRN
  Administered 2014-06-09 (×2): via INTRAVENOUS

## 2014-06-09 MED ORDER — ALBUTEROL SULFATE (2.5 MG/3ML) 0.083% IN NEBU: 2.5000 mg | INHALATION_SOLUTION | Freq: Four times a day (QID) | RESPIRATORY_TRACT | Status: DC | PRN

## 2014-06-09 MED ORDER — ACETAMINOPHEN 160 MG/5ML PO SOLN
325.0000 mg | ORAL | Status: DC | PRN
Start: 2014-06-09 — End: 2014-06-09
  Filled 2014-06-09: qty 20.3

## 2014-06-09 MED ORDER — ACETAMINOPHEN 10 MG/ML IV SOLN
INTRAVENOUS | Status: DC | PRN
  Administered 2014-06-09: 1000 mg via INTRAVENOUS

## 2014-06-09 MED ORDER — DULOXETINE HCL 60 MG PO CPEP
60.0000 mg | ORAL_CAPSULE | Freq: Every day | ORAL | Status: DC
  Administered 2014-06-09 – 2014-06-10 (×2): 60 mg via ORAL
  Filled 2014-06-09 (×3): qty 1

## 2014-06-09 MED ORDER — WHITE PETROLATUM GEL
Status: AC
  Filled 2014-06-09: qty 5

## 2014-06-09 MED ORDER — MIDAZOLAM HCL 2 MG/2ML IJ SOLN
INTRAMUSCULAR | Status: AC
  Filled 2014-06-09: qty 2

## 2014-06-09 MED ORDER — MIDAZOLAM HCL 5 MG/5ML IJ SOLN
INTRAMUSCULAR | Status: DC | PRN
Start: 2014-06-09 — End: 2014-06-09
  Administered 2014-06-09: 2 mg via INTRAVENOUS

## 2014-06-09 MED ORDER — FENTANYL CITRATE 0.05 MG/ML IJ SOLN
INTRAMUSCULAR | Status: DC | PRN
  Administered 2014-06-09 (×2): 50 ug via INTRAVENOUS
  Administered 2014-06-09: 25 ug via INTRAVENOUS
  Administered 2014-06-09 (×2): 50 ug via INTRAVENOUS
  Administered 2014-06-09: 75 ug via INTRAVENOUS
  Administered 2014-06-09: 50 ug via INTRAVENOUS
  Administered 2014-06-09 (×2): 25 ug via INTRAVENOUS
  Administered 2014-06-09 (×3): 50 ug via INTRAVENOUS

## 2014-06-09 MED ORDER — DIAZEPAM 5 MG PO TABS: 5.0000 mg | ORAL_TABLET | Freq: Four times a day (QID) | ORAL | Status: DC | PRN

## 2014-06-09 MED ORDER — LACTATED RINGERS IV SOLN
INTRAVENOUS | Status: DC
Start: 2014-06-09 — End: 2014-06-09
  Administered 2014-06-09: 11:00:00 via INTRAVENOUS

## 2014-06-09 MED ORDER — OXYCODONE HCL 5 MG PO TABS
5.0000 mg | ORAL_TABLET | Freq: Once | ORAL | Status: AC | PRN
  Administered 2014-06-09: 5 mg via ORAL

## 2014-06-09 MED ORDER — PROPOFOL INFUSION 10 MG/ML OPTIME
INTRAVENOUS | Status: DC | PRN
  Administered 2014-06-09: 25 ug/kg/min via INTRAVENOUS

## 2014-06-09 MED ORDER — PHENYLEPHRINE HCL 10 MG/ML IJ SOLN
INTRAMUSCULAR | Status: DC | PRN
  Administered 2014-06-09: 80 ug via INTRAVENOUS

## 2014-06-09 MED ORDER — FENTANYL 50 MCG/HR TD PT72: 100.0000 ug | MEDICATED_PATCH | TRANSDERMAL | Status: DC

## 2014-06-09 MED ORDER — CEFAZOLIN SODIUM-DEXTROSE 2-3 GM-% IV SOLR
2.0000 g | Freq: Three times a day (TID) | INTRAVENOUS | Status: AC
  Administered 2014-06-09 – 2014-06-10 (×3): 2 g via INTRAVENOUS
  Filled 2014-06-09 (×3): qty 50

## 2014-06-09 MED ORDER — PROPOFOL 10 MG/ML IV BOLUS
INTRAVENOUS | Status: AC
  Filled 2014-06-09: qty 20

## 2014-06-09 MED ORDER — GABAPENTIN 600 MG PO TABS
600.0000 mg | ORAL_TABLET | Freq: Three times a day (TID) | ORAL | Status: DC
  Administered 2014-06-09 – 2014-06-10 (×2): 600 mg via ORAL
  Filled 2014-06-09 (×5): qty 1

## 2014-06-09 MED ORDER — 0.9 % SODIUM CHLORIDE (POUR BTL) OPTIME
TOPICAL | Status: DC | PRN
  Administered 2014-06-09: 1000 mL

## 2014-06-09 MED ORDER — SUCCINYLCHOLINE CHLORIDE 20 MG/ML IJ SOLN
INTRAMUSCULAR | Status: AC
  Filled 2014-06-09: qty 1

## 2014-06-09 MED ORDER — PHENYLEPHRINE 40 MCG/ML (10ML) SYRINGE FOR IV PUSH (FOR BLOOD PRESSURE SUPPORT)
PREFILLED_SYRINGE | INTRAVENOUS | Status: AC
  Filled 2014-06-09: qty 10

## 2014-06-09 MED ORDER — PHENYLEPHRINE HCL 10 MG/ML IJ SOLN
10.0000 mg | INTRAVENOUS | Status: DC | PRN
  Administered 2014-06-09: 10 ug/min via INTRAVENOUS

## 2014-06-09 MED ORDER — ASPIRIN 81 MG PO CHEW
81.0000 mg | CHEWABLE_TABLET | Freq: Every day | ORAL | Status: DC
  Administered 2014-06-09 – 2014-06-10 (×2): 81 mg via ORAL
  Filled 2014-06-09 (×3): qty 1

## 2014-06-09 MED ORDER — MOMETASONE FURO-FORMOTEROL FUM 100-5 MCG/ACT IN AERO
2.0000 | INHALATION_SPRAY | Freq: Two times a day (BID) | RESPIRATORY_TRACT | Status: DC
  Filled 2014-06-09: qty 8.8

## 2014-06-09 MED ORDER — HYDROMORPHONE HCL 2 MG PO TABS
4.0000 mg | ORAL_TABLET | ORAL | Status: DC | PRN
  Administered 2014-06-10 (×2): 6 mg via ORAL
  Filled 2014-06-09 (×2): qty 3

## 2014-06-09 MED ORDER — SUCCINYLCHOLINE CHLORIDE 20 MG/ML IJ SOLN
INTRAMUSCULAR | Status: DC | PRN
  Administered 2014-06-09: 60 mg via INTRAVENOUS

## 2014-06-09 MED ORDER — ACETAMINOPHEN 325 MG PO TABS
1300.0000 mg | ORAL_TABLET | Freq: Three times a day (TID) | ORAL | Status: DC | PRN
  Administered 2014-06-10: 1300 mg via ORAL
  Filled 2014-06-09: qty 4

## 2014-06-09 MED ORDER — OXYCODONE HCL 5 MG/5ML PO SOLN: 5.0000 mg | Freq: Once | ORAL | Status: AC | PRN

## 2014-06-09 MED ORDER — LIDOCAINE HCL (CARDIAC) 20 MG/ML IV SOLN
INTRAVENOUS | Status: DC | PRN
  Administered 2014-06-09: 60 mg via INTRAVENOUS

## 2014-06-09 MED ORDER — PROMETHAZINE HCL 25 MG PO TABS: 12.5000 mg | ORAL_TABLET | Freq: Four times a day (QID) | ORAL | Status: DC | PRN

## 2014-06-09 MED ORDER — LINACLOTIDE 290 MCG PO CAPS
290.0000 ug | ORAL_CAPSULE | Freq: Every day | ORAL | Status: DC
  Administered 2014-06-09 – 2014-06-10 (×2): 290 ug via ORAL
  Filled 2014-06-09 (×2): qty 1

## 2014-06-09 MED ORDER — PANTOPRAZOLE SODIUM 40 MG PO TBEC
40.0000 mg | DELAYED_RELEASE_TABLET | Freq: Two times a day (BID) | ORAL | Status: DC
  Administered 2014-06-10: 40 mg via ORAL
  Filled 2014-06-09: qty 1

## 2014-06-09 MED ORDER — MORPHINE SULFATE 4 MG/ML IJ SOLN
INTRAMUSCULAR | Status: AC
  Filled 2014-06-09: qty 1

## 2014-06-09 SURGICAL SUPPLY — 56 items
BAG DECANTER FOR FLEXI CONT (MISCELLANEOUS) ×2 IMPLANT
BINDER BREAST LRG (GAUZE/BANDAGES/DRESSINGS) IMPLANT
BINDER BREAST MEDIUM (GAUZE/BANDAGES/DRESSINGS) IMPLANT
BINDER BREAST XLRG (GAUZE/BANDAGES/DRESSINGS) IMPLANT
BINDER BREAST XXLRG (GAUZE/BANDAGES/DRESSINGS) ×1 IMPLANT
BIOPATCH RED 1 DISK 7.0 (GAUZE/BANDAGES/DRESSINGS) ×2 IMPLANT
BLADE 10 SAFETY STRL DISP (BLADE) ×7 IMPLANT
BLADE HEX COATED 2.75 (ELECTRODE) ×2 IMPLANT
BLADE SURG 15 STRL LF DISP TIS (BLADE) ×2 IMPLANT
BLADE SURG 15 STRL SS (BLADE) ×4
BNDG GAUZE ELAST 4 BULKY (GAUZE/BANDAGES/DRESSINGS) ×4 IMPLANT
CANISTER SUCTION 1200CC (MISCELLANEOUS) ×2 IMPLANT
CHLORAPREP W/TINT 26ML (MISCELLANEOUS) ×2 IMPLANT
DECANTER SPIKE VIAL GLASS SM (MISCELLANEOUS) IMPLANT
DRAIN CHANNEL 15F RND FF W/TCR (WOUND CARE) ×2 IMPLANT
DRAIN CHANNEL 19F RND (DRAIN) IMPLANT
DRAPE LAPAROSCOPIC ABDOMINAL (DRAPES) ×2 IMPLANT
DRSG PAD ABDOMINAL 8X10 ST (GAUZE/BANDAGES/DRESSINGS) ×2 IMPLANT
ELECT BLADE 4.0 EZ CLEAN MEGAD (MISCELLANEOUS) ×2
ELECT COATED BLADE 2.86 ST (ELECTRODE) ×2 IMPLANT
ELECT REM PT RETURN 9FT ADLT (ELECTROSURGICAL) ×2
ELECTRODE BLDE 4.0 EZ CLN MEGD (MISCELLANEOUS) ×1 IMPLANT
ELECTRODE REM PT RTRN 9FT ADLT (ELECTROSURGICAL) ×1 IMPLANT
EVACUATOR SILICONE 100CC (DRAIN) ×2 IMPLANT
GLOVE BIOGEL PI IND STRL 6.5 (GLOVE) IMPLANT
GLOVE BIOGEL PI IND STRL 8 (GLOVE) IMPLANT
GLOVE BIOGEL PI INDICATOR 6.5 (GLOVE) ×1
GLOVE BIOGEL PI INDICATOR 8 (GLOVE) ×1
GLOVE SURG SS PI 6.0 STRL IVOR (GLOVE) ×4 IMPLANT
GLOVE SURG SS PI 6.5 STRL IVOR (GLOVE) ×5 IMPLANT
GOWN STRL REUS W/ TWL LRG LVL3 (GOWN DISPOSABLE) ×2 IMPLANT
GOWN STRL REUS W/TWL LRG LVL3 (GOWN DISPOSABLE) ×4
LIQUID BAND (GAUZE/BANDAGES/DRESSINGS) ×2 IMPLANT
NDL HYPO 25X1 1.5 SAFETY (NEEDLE) IMPLANT
NDL SPNL 22GX3.5 QUINCKE BK (NEEDLE) ×1 IMPLANT
NEEDLE HYPO 25X1 1.5 SAFETY (NEEDLE) IMPLANT
NEEDLE SPNL 22GX3.5 QUINCKE BK (NEEDLE) ×2 IMPLANT
NS IRRIG 1000ML POUR BTL (IV SOLUTION) ×2 IMPLANT
PACK GENERAL/GYN (CUSTOM PROCEDURE TRAY) ×2 IMPLANT
PAD ABD 8X10 STRL (GAUZE/BANDAGES/DRESSINGS) ×1 IMPLANT
SPONGE LAP 18X18 X RAY DECT (DISPOSABLE) ×7 IMPLANT
STAPLER VISISTAT 35W (STAPLE) ×4 IMPLANT
SUT ETHILON 2 0 FS 18 (SUTURE) ×2 IMPLANT
SUT MNCRL AB 4-0 PS2 18 (SUTURE) ×3 IMPLANT
SUT MON AB 5-0 PS2 18 (SUTURE) IMPLANT
SUT SILK 3 0 PS 1 (SUTURE) IMPLANT
SUT VIC AB 3-0 PS2 18 (SUTURE) ×8
SUT VIC AB 3-0 PS2 18XBRD (SUTURE) IMPLANT
SUT VIC AB 3-0 SH 27 (SUTURE)
SUT VIC AB 3-0 SH 27X BRD (SUTURE) IMPLANT
SUT VICRYL 4-0 PS2 18IN ABS (SUTURE) ×8 IMPLANT
SYR 50ML LL SCALE MARK (SYRINGE) IMPLANT
SYR CONTROL 10ML LL (SYRINGE) IMPLANT
TOWEL OR 17X24 6PK STRL BLUE (TOWEL DISPOSABLE) ×4 IMPLANT
TUBE CONNECTING 20X1/4 (TUBING) ×2 IMPLANT
YANKAUER SUCT BULB TIP NO VENT (SUCTIONS) ×2 IMPLANT

## 2014-06-09 NOTE — Interval H&P Note (Signed)
History and Physical Interval Note:  06/09/2014 8:09 AM  Yolanda Yang  has presented today for surgery, with the diagnosis of [place], BILATERAL SHOULDER PAIN  The various methods of treatment have been discussed with the patient and family. After consideration of risks, benefits and other options for treatment, the patient has consented to  Procedure(s): BILATERAL MAMMARY REDUCTION  (BREAST) (Bilateral) as a surgical intervention .  The patient's history has been reviewed, patient examined, no change in status, stable for surgery.  I have reviewed the patient's chart and labs.  Questions were answered to the patient's satisfaction.     Eugenie Harewood

## 2014-06-09 NOTE — Anesthesia Procedure Notes (Signed)
Procedure Name: Intubation Date/Time: 06/09/2014 1:15 PM Performed by: Maeola Harman Pre-anesthesia Checklist: Patient identified, Emergency Drugs available, Suction available, Patient being monitored and Timeout performed Patient Re-evaluated:Patient Re-evaluated prior to inductionOxygen Delivery Method: Circle system utilized Preoxygenation: Pre-oxygenation with 100% oxygen Intubation Type: IV induction and Rapid sequence Ventilation: Mask ventilation without difficulty Laryngoscope Size: Mac and 3 Grade View: Grade I Tube type: Oral Tube size: 7.0 mm Number of attempts: 1 Airway Equipment and Method: Stylet and Video-laryngoscopy Placement Confirmation: ETT inserted through vocal cords under direct vision,  positive ETCO2 and breath sounds checked- equal and bilateral Secured at: 21 cm Tube secured with: Tape Dental Injury: Teeth and Oropharynx as per pre-operative assessment  Difficulty Due To: Difficulty was anticipated and Difficult Airway- due to reduced neck mobility Comments: Pt with known difficult airway.  RSI planned. Easy mask ventilation noted.  DL x 1 with video laryngoscope, Grade I view noted.  ETT passed easily.  Waldron Session, CRNA

## 2014-06-09 NOTE — Transfer of Care (Signed)
Immediate Anesthesia Transfer of Care Note  Patient: Yolanda Yang  Procedure(s) Performed: Procedure(s): BILATERAL MAMMARY REDUCTION  (BREAST) (Bilateral)  Patient Location: PACU  Anesthesia Type:General  Level of Consciousness: awake, alert  and oriented  Airway & Oxygen Therapy: Patient Spontanous Breathing and Patient connected to nasal cannula oxygen  Post-op Assessment: Report given to PACU RN and Post -op Vital signs reviewed and stable  Post vital signs: Reviewed and stable  Complications: No apparent anesthesia complications

## 2014-06-09 NOTE — Anesthesia Postprocedure Evaluation (Signed)
Anesthesia Post Note  Patient: Yolanda Yang  Procedure(s) Performed: Procedure(s) (LRB): BILATERAL MAMMARY REDUCTION  (BREAST) (Bilateral)  Anesthesia type: General  Patient location: PACU  Post pain: Pain level controlled and Adequate analgesia  Post assessment: Post-op Vital signs reviewed, Patient's Cardiovascular Status Stable, Respiratory Function Stable, Patent Airway and Pain level controlled  Last Vitals:  Filed Vitals:   06/09/14 1830  BP: 119/73  Pulse: 76  Temp:   Resp: 13    Post vital signs: Reviewed and stable  Level of consciousness: awake, alert  and oriented  Complications: No apparent anesthesia complications

## 2014-06-09 NOTE — Anesthesia Preprocedure Evaluation (Addendum)
Anesthesia Evaluation  Patient identified by MRN, date of birth, ID band Patient awake    Reviewed: Allergy & Precautions, H&P , NPO status , Patient's Chart, lab work & pertinent test results, Unable to perform ROS - Chart review only  History of Anesthesia Complications (+) DIFFICULT AIRWAY, POST - OP SPINAL HEADACHE and history of anesthetic complications  Airway Mallampati: IV  TM Distance: >3 FB Neck ROM: Limited    Dental  (+) Poor Dentition, Missing,    Pulmonary shortness of breath, asthma , former smoker,  breath sounds clear to auscultation        Cardiovascular hypertension, Pt. on medications Rhythm:Regular     Neuro/Psych  Headaches, PSYCHIATRIC DISORDERS Anxiety Brain aneurysm     GI/Hepatic GERD-  Medicated and Controlled,  Endo/Other  Morbid obesity  Renal/GU      Musculoskeletal  (+) Arthritis -,   Abdominal   Peds  Hematology  (+) anemia ,   Anesthesia Other Findings MAC 3 Videolaryngoscope utilized 06/09/14.  Easy mask ventilation, Grade I view noted.  H Weaver,CRNA   Reproductive/Obstetrics                          Anesthesia Physical Anesthesia Plan  ASA: III  Anesthesia Plan: General   Post-op Pain Management:    Induction: Intravenous  Airway Management Planned: Oral ETT and Video Laryngoscope Planned  Additional Equipment:   Intra-op Plan:   Post-operative Plan: Extubation in OR  Informed Consent: I have reviewed the patients History and Physical, chart, labs and discussed the procedure including the risks, benefits and alternatives for the proposed anesthesia with the patient or authorized representative who has indicated his/her understanding and acceptance.   Dental advisory given  Plan Discussed with: CRNA  Anesthesia Plan Comments:         Anesthesia Quick Evaluation

## 2014-06-09 NOTE — Op Note (Signed)
Operative Note   DATE OF OPERATION: 12.10.2015  LOCATION: [place] Main OR- observation  SURGICAL DIVISION: Plastic Surgery  PREOPERATIVE DIAGNOSES:  Macromastia, neck pain, back pain  POSTOPERATIVE DIAGNOSES:  same  PROCEDURE:  Bilateral breast reduction  SURGEON: Irene Limbo MD MBA  ASSISTANT: S. Rayburn PA-C  ANESTHESIA:  General.   EBL: 536 ml  COMPLICATIONS: None immediate.   INDICATIONS FOR PROCEDURE:  The patient, Yolanda Yang, is a 58 y.o. female born on Sep 04, 1955, is here for bilateral breast reduction for treatment of symptomatic macromastia with concomitant neck and back pain chronic.Marland Kitchen   FINDINGS: Right breast reduction 1251 g, left breast reduction 681 g  DESCRIPTION OF PROCEDURE:  Patient was marked in upright sitting position to mark chest midline, breast meridians and anterior axillary line. The inframammary fold was transferred onto anterior surface of chest by palpation. This was marked symmetrically from sternal notch. With aid of Wise pattern marker, the new nipple areolar complex was marked with 8.5 cm vertical limbs. The patient was brought into the operating room and placed in a supine position. SCDs were placed and appropriate padding was performed. Antibiotics were given. The patient underwent general anesthesia and the chest was prepped and draped in a sterile fashion. A timeout was performed and all information was confirmed to be correct. In the supine position, the inframammary fold was marked and inferior pedicle designed. With the breast on stretch, a 50 mm diameter marker was used to mark the NAC. The remainder of pedicle was depithelialized. The lateral and medial triangles were resected taking care to preserve a layer of breast tissue and fascia over underlying pectoralis muscle. Additional tissue was excised from superior pole. The wound was irrigated and inspected for hemostasis. The breast was tailor tacked closed. A similar procedure was  performed on opposite breast. The patient was then brought to upright position and assessed for symmetry. The staples were removed, and the additional tissue marked by tailor tacking was removed. The pocket was irrigated and hemostasis confirmed. 15 Fr Jp drains were placed in each breast cavity and secured with 2-0 nylon. Closure was completed in layers with 3-0 vicryl for approximation of dermis along inframammary fold and 4-0 vicryl in dermal layer along vertical closure and for inset of NAC. Skin closure was completed throughout with subcuticular 4-0 Monocryl. The nipple and skin flaps had good capillary refill at the end of the procedure. Dermabond was applied followed by dry dressing and breast binder.   The patient was allowed to wake from anesthesia, extubated and taken to the recovery room in satisfactory condition.   SPECIMENS: right and left breast tissue  DRAINS: 77 Fr JP in right and left breast  Irene Limbo, MD [facility] Plastic & Reconstructive Surgery [phone]

## 2014-06-10 ENCOUNTER — Encounter (HOSPITAL_BASED_OUTPATIENT_CLINIC_OR_DEPARTMENT_OTHER): Admission: RE | Payer: Self-pay | Source: Ambulatory Visit

## 2014-06-10 ENCOUNTER — Ambulatory Visit (HOSPITAL_BASED_OUTPATIENT_CLINIC_OR_DEPARTMENT_OTHER): Admission: RE | Admit: 2014-06-10 | Payer: Medicare PPO | Source: Ambulatory Visit | Admitting: Plastic Surgery

## 2014-06-10 ENCOUNTER — Encounter (HOSPITAL_COMMUNITY): Payer: Self-pay | Admitting: Plastic Surgery

## 2014-06-10 DIAGNOSIS — N62 Hypertrophy of breast: Secondary | ICD-10-CM | POA: Diagnosis not present

## 2014-06-10 SURGERY — MAMMOPLASTY, REDUCTION
Anesthesia: General | Site: Breast | Laterality: Bilateral

## 2014-06-10 MED ORDER — HYDROMORPHONE HCL 4 MG PO TABS: 4.0000 mg | ORAL_TABLET | ORAL | Status: AC | PRN

## 2014-06-10 NOTE — Progress Notes (Signed)
Patient is discharged from room 6N28 at this time. Alert and in stable condition. IV site d/c'd. Instructions read to patient and understanding verbalized. Prescription also given to patient. Awaiting ride at this time.

## 2014-06-10 NOTE — Discharge Planning (Signed)
At 1600, reviewed JP care with pt and husband, they verbalize understanding. Dc per w/c to private car home with all personal belongings acomp. By husband at 67.

## 2014-06-10 NOTE — Progress Notes (Signed)
UR completed 

## 2014-06-10 NOTE — Discharge Summary (Signed)
Physician Discharge Summary  Patient ID: Yolanda Yang MRN: [id number] DOB/AGE: 56-03-1956 56 y.o.  Admit date: 06/09/2014 Discharge date: 06/10/2014  Admission Diagnoses: macromastia, neck pain, back pain  Discharge Diagnoses:  Active Problems:   Macromastia   Discharged Condition: stable  Hospital Course: Maintained on IV pain medication and able to transition back to her baseline oral Dilaudid, fentanyl patch regimen. Independent with ambulation and drain teaching done.   Treatments: bilateral breast reduction 12.10.2015  Discharge Exam: Blood pressure 96/49, pulse 89, temperature 98 F (36.7 C), temperature source Oral, resp. rate 15, height 5\' 5"  (1.651 m), weight 83.915 kg (185 lb), SpO2 92 %. Incision/Wound: NACs viables, breasts soft, incisions intact with scant drainage  Disposition: 01-Home or Self Care  Discharge Instructions    Call MD for:  redness, tenderness, or signs of infection (pain, swelling, bleeding, redness, odor or green/yellow discharge around incision site)    Complete by:  As directed      Call MD for:  severe or increased pain, loss or decreased feeling  in affected limb(s)    Complete by:  As directed      Discharge instructions    Complete by:  As directed   Ok to remove dressings and shower am 06/11/14. Pat incisions dry. Dry dressing as needed. Soft support bra or breast binder all times.  JPs to bulb suction, strip and record twice daily.  Ice packs as desired to chest for comfort.     Driving Restrictions    Complete by:  As directed   No driving until follow up visit     Lifting restrictions    Complete by:  As directed   No lifting greater than 5-10 lbs     Resume previous diet    Complete by:  As directed             Medication List    TAKE these medications        acetaminophen 650 MG CR tablet  Commonly known as:  TYLENOL  Take 1,300 mg by mouth every 8 (eight) hours as needed for pain.     ADVAIR DISKUS 250-50  MCG/DOSE Aepb  Generic drug:  Fluticasone-Salmeterol  INHALE ONE DOSE BY MOUTH TWICE DAILY     AEROCHAMBER MV inhaler  Use as instructed     albuterol 108 (90 BASE) MCG/ACT inhaler  Commonly known as:  PROVENTIL HFA;VENTOLIN HFA  Inhale 2 puffs into the lungs every 6 (six) hours as needed for wheezing or shortness of breath.     albuterol (2.5 MG/3ML) 0.083% nebulizer solution  Commonly known as:  PROVENTIL  Take 3 mLs (2.5 mg total) by nebulization every 6 (six) hours as needed for wheezing.     amoxicillin-clavulanate 875-125 MG per tablet  Commonly known as:  AUGMENTIN  Take 1 tablet by mouth 2 (two) times daily.     aspirin 81 MG chewable tablet  Chew 81 mg by mouth daily.     butalbital-acetaminophen-caffeine 50-325-40 MG per tablet  Commonly known as:  FIORICET, ESGIC  Take 1 tablet by mouth daily as needed for headache.     cyclobenzaprine 10 MG tablet  Commonly known as:  FLEXERIL  Take 10 mg by mouth 3 (three) times daily as needed for muscle spasms.     diazepam 5 MG tablet  Commonly known as:  VALIUM  Take 5 mg by mouth every 6 (six) hours as needed for anxiety.     DULoxetine 60 MG capsule  Commonly  known as:  CYMBALTA  Take 60 mg by mouth daily.     fentaNYL 100 MCG/HR  Commonly known as:  DURAGESIC - dosed mcg/hr  Place 100 mcg onto the skin every other day. Sig states every 2 days     fexofenadine 180 MG tablet  Commonly known as:  ALLEGRA  Take 180 mg by mouth daily as needed for allergies or rhinitis.     gabapentin 600 MG tablet  Commonly known as:  NEURONTIN  Take 600 mg by mouth 3 (three) times daily.     guaiFENesin 600 MG 12 hr tablet  Commonly known as:  MUCINEX  Take by mouth 2 (two) times daily as needed for to loosen phlegm.     HYDROmorphone 4 MG tablet  Commonly known as:  DILAUDID  Take 1-1.5 tablets (4-6 mg total) by mouth every 4 (four) hours as needed for severe pain.     HYDROmorphone 4 MG tablet  Commonly known as:  DILAUDID   Take 4 mg by mouth every 4 (four) hours as needed for pain.     levofloxacin 500 MG tablet  Commonly known as:  LEVAQUIN  Take 1 tablet (500 mg total) by mouth daily.     LINZESS 290 MCG Caps capsule  Generic drug:  Linaclotide  Take 290 mcg by mouth daily.     pantoprazole 40 MG tablet  Commonly known as:  PROTONIX  Take 1 tablet (40 mg total) by mouth 2 (two) times daily before a meal.     pentosan polysulfate 100 MG capsule  Commonly known as:  ELMIRON  Take 100 mg by mouth 2 (two) times daily.     predniSONE 10 MG tablet  Commonly known as:  DELTASONE  Take 40 mg daily x 3 days, 30 mg daily x 3 days, 20 mg daily x 3 days, 10 mg daily x 3 days then stop     promethazine 12.5 MG tablet  Commonly known as:  PHENERGAN  Take 12.5 mg by mouth every 6 (six) hours as needed for nausea.     Zoar PRO-B Caps  Take 1 capsule by mouth every morning.     rosuvastatin 10 MG tablet  Commonly known as:  CRESTOR  Take 10 mg by mouth daily.     URIBEL 118 MG Caps  Take 2 capsules by mouth 2 (two) times daily as needed (interstitial cystitis).           Follow-up Information    Follow up with [facility], Arnoldo Hooker, MD In 1 week.   Specialty:  Plastic Surgery   Why:  as scheduled   Contact information:   [address] [address] [phone]       Follow up with Irene Limbo, MD On 06/13/2014.   Specialty:  Plastic Surgery   Why:  for drain removal, RN visit, 3:45 pm   Contact information:   [address] [place] [address] [phone]       Signed: Irene Limbo 06/10/2014, 8:49 AM

## 2014-06-16 DIAGNOSIS — D0502 Lobular carcinoma in situ of left breast: Secondary | ICD-10-CM | POA: Insufficient documentation

## 2014-06-29 DIAGNOSIS — N6091 Unspecified benign mammary dysplasia of right breast: Secondary | ICD-10-CM | POA: Insufficient documentation

## 2014-07-05 DIAGNOSIS — N760 Acute vaginitis: Secondary | ICD-10-CM | POA: Diagnosis not present

## 2014-07-05 DIAGNOSIS — N390 Urinary tract infection, site not specified: Secondary | ICD-10-CM | POA: Diagnosis not present

## 2014-07-11 DIAGNOSIS — R0683 Snoring: Secondary | ICD-10-CM | POA: Diagnosis not present

## 2014-07-11 DIAGNOSIS — I7771 Dissection of carotid artery: Secondary | ICD-10-CM | POA: Diagnosis not present

## 2014-07-11 DIAGNOSIS — R011 Cardiac murmur, unspecified: Secondary | ICD-10-CM | POA: Diagnosis not present

## 2014-07-11 DIAGNOSIS — Z981 Arthrodesis status: Secondary | ICD-10-CM | POA: Diagnosis not present

## 2014-07-11 DIAGNOSIS — N6091 Unspecified benign mammary dysplasia of right breast: Secondary | ICD-10-CM | POA: Diagnosis not present

## 2014-07-11 DIAGNOSIS — D0502 Lobular carcinoma in situ of left breast: Secondary | ICD-10-CM | POA: Diagnosis not present

## 2014-07-11 DIAGNOSIS — G4700 Insomnia, unspecified: Secondary | ICD-10-CM | POA: Diagnosis not present

## 2014-07-11 DIAGNOSIS — E780 Pure hypercholesterolemia: Secondary | ICD-10-CM | POA: Diagnosis not present

## 2014-07-11 DIAGNOSIS — I671 Cerebral aneurysm, nonruptured: Secondary | ICD-10-CM | POA: Diagnosis not present

## 2014-07-18 DIAGNOSIS — Z79891 Long term (current) use of opiate analgesic: Secondary | ICD-10-CM | POA: Diagnosis not present

## 2014-07-18 DIAGNOSIS — G894 Chronic pain syndrome: Secondary | ICD-10-CM | POA: Diagnosis not present

## 2014-07-18 DIAGNOSIS — M461 Sacroiliitis, not elsewhere classified: Secondary | ICD-10-CM | POA: Diagnosis not present

## 2014-07-19 DIAGNOSIS — R69 Illness, unspecified: Secondary | ICD-10-CM | POA: Diagnosis not present

## 2014-07-28 DIAGNOSIS — N3010 Interstitial cystitis (chronic) without hematuria: Secondary | ICD-10-CM | POA: Diagnosis not present

## 2014-07-28 DIAGNOSIS — R109 Unspecified abdominal pain: Secondary | ICD-10-CM | POA: Diagnosis not present

## 2014-08-02 DIAGNOSIS — N3010 Interstitial cystitis (chronic) without hematuria: Secondary | ICD-10-CM | POA: Diagnosis not present

## 2014-08-05 DIAGNOSIS — N3010 Interstitial cystitis (chronic) without hematuria: Secondary | ICD-10-CM | POA: Diagnosis not present

## 2014-08-10 DIAGNOSIS — N3010 Interstitial cystitis (chronic) without hematuria: Secondary | ICD-10-CM | POA: Diagnosis not present

## 2014-08-16 DIAGNOSIS — N3010 Interstitial cystitis (chronic) without hematuria: Secondary | ICD-10-CM | POA: Diagnosis not present

## 2014-08-19 DIAGNOSIS — R350 Frequency of micturition: Secondary | ICD-10-CM | POA: Diagnosis not present

## 2014-08-19 DIAGNOSIS — R351 Nocturia: Secondary | ICD-10-CM | POA: Diagnosis not present

## 2014-08-19 DIAGNOSIS — N3010 Interstitial cystitis (chronic) without hematuria: Secondary | ICD-10-CM | POA: Diagnosis not present

## 2014-08-19 DIAGNOSIS — R109 Unspecified abdominal pain: Secondary | ICD-10-CM | POA: Diagnosis not present

## 2014-08-23 DIAGNOSIS — N3010 Interstitial cystitis (chronic) without hematuria: Secondary | ICD-10-CM | POA: Diagnosis not present

## 2014-08-26 DIAGNOSIS — R109 Unspecified abdominal pain: Secondary | ICD-10-CM | POA: Diagnosis not present

## 2014-08-26 DIAGNOSIS — N3010 Interstitial cystitis (chronic) without hematuria: Secondary | ICD-10-CM | POA: Diagnosis not present

## 2014-08-26 DIAGNOSIS — N3020 Other chronic cystitis without hematuria: Secondary | ICD-10-CM | POA: Diagnosis not present

## 2014-08-31 DIAGNOSIS — N3010 Interstitial cystitis (chronic) without hematuria: Secondary | ICD-10-CM | POA: Diagnosis not present

## 2014-09-02 ENCOUNTER — Other Ambulatory Visit: Payer: Self-pay | Admitting: Urology

## 2014-09-02 ENCOUNTER — Other Ambulatory Visit: Payer: Self-pay | Admitting: Internal Medicine

## 2014-09-02 DIAGNOSIS — N3010 Interstitial cystitis (chronic) without hematuria: Secondary | ICD-10-CM | POA: Diagnosis not present

## 2014-09-05 ENCOUNTER — Encounter (HOSPITAL_BASED_OUTPATIENT_CLINIC_OR_DEPARTMENT_OTHER): Payer: Self-pay | Admitting: *Deleted

## 2014-09-05 DIAGNOSIS — Z4789 Encounter for other orthopedic aftercare: Secondary | ICD-10-CM | POA: Diagnosis not present

## 2014-09-05 DIAGNOSIS — S43421D Sprain of right rotator cuff capsule, subsequent encounter: Secondary | ICD-10-CM | POA: Diagnosis not present

## 2014-09-06 ENCOUNTER — Encounter (HOSPITAL_BASED_OUTPATIENT_CLINIC_OR_DEPARTMENT_OTHER): Payer: Self-pay | Admitting: *Deleted

## 2014-09-06 NOTE — Progress Notes (Signed)
NPO AFTER MN. ARRIVE AT 0600. NEED ISTAT. CURRENT EKG IN CHART AND EPIC.  WILL TAKE PROTONIX, NEURONTIN, AND DO ADVAIR INHALER AM DOS W/ SIPS OF WATER.

## 2014-09-07 NOTE — H&P (Signed)
History of Present Illness   Yolanda Yang does well with her interstitial cystitis with 1 or 2 bladder rescue treatments a week. In the past she has had Valium at bedtime and the Uribel or Urelle throughout the day. She, I believe, has been treated for bacterial vaginosis in the past.    When I saw Yolanda Yang recently, she had been given antibiotics for bacterial vaginosis as noted. _____ was not helping. She understood the mixed picture of bacterial vaginitis versus bacterial cystitis versus interstitial cystitis. I put her on daily Macrodantin and rescue treatments and see her back in 2 weeks. Urine culture from August 19, 2014 and February 26 were normal. I received her medical records from Dr Helane Rima. She was given Tindamax 500 mg twice a day for 2 days. Baking soda and boric acid suppositories were discussed. She felt she possibly had a vaginitis or vulvitis.    Past Medical History Problems  1. History of Irritable bowel syndrome with diarrhea (K58.0)  Surgical History Problems  1. History of Back Surgery 2. Bladder Surgery 3. History of Breast Surgery 4. History of Hysterectomy  Current Meds 1. Duragesic-100 100 MCG/HR Transdermal Patch 72 Hour;  Therapy: (Recorded:29Nov2007) to Recorded 2. Elmiron 100 MG Oral Capsule; TAKE TWO CAPSULES BY MOUTH TWICE DAILY;  Therapy: [id number] to (Evaluate:30Jun2016)  Requested for: [phone]; Last  Rx:06Jul2015 Ordered 3. Fluconazole 100 MG Oral Tablet; Take 100 mg daily x 3 days;  Therapy: 05Apr2011 to (Last Rx:05Apr2011) Ordered 4. Hydrochlorothiazide 50 MG Oral Tablet; TAKE 1 TABLET DAILY AS DIRECTED;  Therapy: 21Jan2010 to (Evaluate:16Jan2011); Last Rx:21Jan2010 Ordered 5. Levofloxacin 500 MG Oral Tablet; TAKE ONE TABLET BY MOUTH TWICE DAILY;  Therapy: [id number] to (Evaluate:25Sep2015)  Requested for: 28Aug2015; Last  Rx:28Aug2015 Ordered 6. Lidocaine HCl - 2 % Injection Solution;  Therapy: (Recorded:30Apr2008) to Recorded 7. Neurontin 300 MG  Oral Capsule;  Therapy: 21Jan2010 to Recorded 8. Protonix 40 MG Oral Tablet Delayed Release;  Therapy: (Recorded:29Nov2007) to Recorded 9. Sodium Bicarbonate 8.4 % Intravenous Solution;  Therapy: (Recorded:30Apr2008) to Recorded 10. Spiriva HandiHaler 18 MCG Inhalation Capsule;   Therapy: (Recorded:21Jan2010) to Recorded  Allergies Non-Medication  1. Latex  Family History Problems  1. Family history of Diabetes Mellitus 2. Family history of Family Health Status Number Of Children   1 CHILD 3. Family history of Hypertension 4. Family history of Prostate Cancer  Social History Problems  1. Denied: Alcohol Use 2. Tobacco Use   10 CIGARETTES PER DAY FOR 20 YRS  Results/Data  Urine [Data Includes: Last 1 Day]   [id number]  COLOR YELLOW   APPEARANCE CLEAR   SPECIFIC GRAVITY 1.025   pH 6.0   GLUCOSE NEG mg/dL  BILIRUBIN NEG   KETONE NEG mg/dL  BLOOD TRACE   PROTEIN NEG mg/dL  UROBILINOGEN 0.2 mg/dL  NITRITE NEG   LEUKOCYTE ESTERASE NEG   SQUAMOUS EPITHELIAL/HPF RARE   WBC NONE SEEN WBC/hpf  RBC 0-2 RBC/hpf  BACTERIA NONE SEEN   CRYSTALS NONE SEEN   CASTS NONE SEEN    Plan Chronic interstitial cystitis without hematuria  1. Follow-up Schedule Surgery Office  Follow-up  Status: Complete  Done: [id number]  Discussion/Summary   Yolanda Yang is still having vaginal and suprapubic pain. She understands that it is unlikely bacterial vaginosis, but it could be treated by Dr Helane Rima. With all of her negative cultures, I do not think she is having a bacterial cystitis, but I will keep her on Macrodantin to prevent one. I talked to her about a  hydrodistention.  We talked about cystoscopy/hydrodistention and instillation in detail. Pros, cons, general surgical and anesthetic risks, and other options including watchful waiting were discussed. Risks were described but not limited to pain, infection, and bleeding. The risk of bladder perforation and management were discussed. The  patient understands that it is primarily a diagnostic procedure.   Hopefully this will break the pain cycle. Reasonable expectations were set up. Do as early as possible.   In regards to pain, there are no other modifying factors or associated signs or symptoms. There are no other aggravating or relieving factors. Her presentation is moderate in severity and ongoing.   Rescue treatment given.  After a thorough review of the management options for the patient's condition the patient  elected to proceed with surgical therapy as noted above. We have discussed the potential benefits and risks of the procedure, side effects of the proposed treatment, the likelihood of the patient achieving the goals of the procedure, and any potential problems that might occur during the procedure or recuperation. Informed consent has been obtained.

## 2014-09-07 NOTE — Anesthesia Preprocedure Evaluation (Addendum)
Anesthesia Evaluation  Patient identified by MRN, date of birth, ID band Patient awake    Reviewed: Allergy & Precautions, H&P , NPO status , Patient's Chart, lab work & pertinent test results, reviewed documented beta blocker date and time   Airway Mallampati: II  TM Distance: >3 FB Neck ROM: full    Dental  (+) Dental Advisory Given, Edentulous Upper, Edentulous Lower   Pulmonary neg pulmonary ROS, shortness of breath and with exertion, asthma , former smoker,  breath sounds clear to auscultation  Pulmonary exam normal       Cardiovascular Exercise Tolerance: Good negative cardio ROS  Rhythm:regular Rate:Normal     Neuro/Psych Anxiety Right foot drop. Cerebral aneurysm coiling x 4. Cervical fusion negative neurological ROS  negative psych ROS   GI/Hepatic negative GI ROS, Neg liver ROS,   Endo/Other  negative endocrine ROSdiabetes, Well Controlled, Type 2Diet controlled DM  Renal/GU negative Renal ROSIC  negative genitourinary   Musculoskeletal   Abdominal   Peds  Hematology negative hematology ROS (+)   Anesthesia Other Findings   Reproductive/Obstetrics negative OB ROS                            Anesthesia Physical Anesthesia Plan  ASA: III  Anesthesia Plan: General   Post-op Pain Management:    Induction: Intravenous  Airway Management Planned: LMA  Additional Equipment:   Intra-op Plan:   Post-operative Plan:   Informed Consent: I have reviewed the patients History and Physical, chart, labs and discussed the procedure including the risks, benefits and alternatives for the proposed anesthesia with the patient or authorized representative who has indicated his/her understanding and acceptance.   Dental Advisory Given  Plan Discussed with: CRNA and Surgeon  Anesthesia Plan Comments:         Anesthesia Quick Evaluation

## 2014-09-08 ENCOUNTER — Encounter (HOSPITAL_BASED_OUTPATIENT_CLINIC_OR_DEPARTMENT_OTHER)
Admission: RE | Disposition: A | Discharge status: Home / Self Care | Payer: Self-pay | Source: Ambulatory Visit | Attending: Urology

## 2014-09-08 ENCOUNTER — Ambulatory Visit (HOSPITAL_BASED_OUTPATIENT_CLINIC_OR_DEPARTMENT_OTHER)
Admission: RE | Admit: 2014-09-08 | Discharge: 2014-09-08 | Disposition: A | Discharge status: Home / Self Care | Payer: Commercial Managed Care - HMO | Source: Ambulatory Visit | Attending: Urology | Admitting: Urology

## 2014-09-08 ENCOUNTER — Ambulatory Visit (HOSPITAL_BASED_OUTPATIENT_CLINIC_OR_DEPARTMENT_OTHER): Payer: Commercial Managed Care - HMO | Admitting: Anesthesiology

## 2014-09-08 ENCOUNTER — Encounter (HOSPITAL_BASED_OUTPATIENT_CLINIC_OR_DEPARTMENT_OTHER): Payer: Self-pay | Admitting: *Deleted

## 2014-09-08 DIAGNOSIS — Z79899 Other long term (current) drug therapy: Secondary | ICD-10-CM | POA: Diagnosis not present

## 2014-09-08 DIAGNOSIS — F17210 Nicotine dependence, cigarettes, uncomplicated: Secondary | ICD-10-CM | POA: Diagnosis not present

## 2014-09-08 DIAGNOSIS — J45909 Unspecified asthma, uncomplicated: Secondary | ICD-10-CM | POA: Insufficient documentation

## 2014-09-08 DIAGNOSIS — Z91040 Latex allergy status: Secondary | ICD-10-CM | POA: Insufficient documentation

## 2014-09-08 DIAGNOSIS — R102 Pelvic and perineal pain: Secondary | ICD-10-CM | POA: Diagnosis not present

## 2014-09-08 DIAGNOSIS — E119 Type 2 diabetes mellitus without complications: Secondary | ICD-10-CM | POA: Diagnosis not present

## 2014-09-08 DIAGNOSIS — N3010 Interstitial cystitis (chronic) without hematuria: Secondary | ICD-10-CM | POA: Insufficient documentation

## 2014-09-08 HISTORY — PX: CYSTOSCOPY WITH HYDRODISTENSION AND BIOPSY: SHX5127

## 2014-09-08 HISTORY — DX: Other seasonal allergic rhinitis: J30.2

## 2014-09-08 HISTORY — DX: Presence of dental prosthetic device (complete) (partial): Z97.2

## 2014-09-08 HISTORY — DX: Other constipation: K59.09

## 2014-09-08 HISTORY — DX: Personal history of peptic ulcer disease: Z87.11

## 2014-09-08 HISTORY — DX: Personal history of other diseases of the digestive system: Z87.19

## 2014-09-08 HISTORY — DX: Foot drop, right foot: M21.371

## 2014-09-08 HISTORY — DX: Other headache syndrome: G44.89

## 2014-09-08 HISTORY — DX: Hesitancy of micturition: R39.11

## 2014-09-08 HISTORY — DX: Other symptoms and signs involving the genitourinary system: R39.89

## 2014-09-08 HISTORY — DX: Personal history of other diseases of the circulatory system: Z86.79

## 2014-09-08 HISTORY — DX: Cerebral aneurysm, nonruptured: I67.1

## 2014-09-08 HISTORY — DX: Personal history of other diseases of the respiratory system: Z87.09

## 2014-09-08 HISTORY — DX: Unspecified osteoarthritis, unspecified site: M19.90

## 2014-09-08 HISTORY — DX: Unspecified asthma with (acute) exacerbation: J45.901

## 2014-09-08 HISTORY — DX: Prediabetes: R73.03

## 2014-09-08 HISTORY — DX: Retention of urine, unspecified: R33.9

## 2014-09-08 HISTORY — DX: Presence of spectacles and contact lenses: Z97.3

## 2014-09-08 HISTORY — DX: Personal history of urinary (tract) infections: Z87.440

## 2014-09-08 HISTORY — DX: Other specified postprocedural states: Z98.890

## 2014-09-08 LAB — POCT I-STAT 4, (NA,K, GLUC, HGB,HCT)
Glucose, Bld: 95 mg/dL (ref 70–99)
HCT: 35 % — ABNORMAL LOW (ref 36.0–46.0)
HEMOGLOBIN: 11.9 g/dL — AB (ref 12.0–15.0)
Potassium: 3.7 mmol/L (ref 3.5–5.1)
Sodium: 141 mmol/L (ref 135–145)

## 2014-09-08 SURGERY — CYSTOSCOPY, WITH BLADDER HYDRODISTENSION AND BIOPSY
Anesthesia: General | Site: Bladder

## 2014-09-08 MED ORDER — FENTANYL CITRATE 0.05 MG/ML IJ SOLN
INTRAMUSCULAR | Status: AC
  Filled 2014-09-08: qty 2

## 2014-09-08 MED ORDER — FENTANYL CITRATE 0.05 MG/ML IJ SOLN
25.0000 ug | INTRAMUSCULAR | Status: DC | PRN
  Filled 2014-09-08: qty 1

## 2014-09-08 MED ORDER — FENTANYL CITRATE 0.05 MG/ML IJ SOLN
INTRAMUSCULAR | Status: DC | PRN
  Administered 2014-09-08: 25 ug via INTRAVENOUS

## 2014-09-08 MED ORDER — LIDOCAINE HCL (CARDIAC) 20 MG/ML IV SOLN
INTRAVENOUS | Status: DC | PRN
Start: 2014-09-08 — End: 2014-09-08
  Administered 2014-09-08: 50 mg via INTRAVENOUS

## 2014-09-08 MED ORDER — LACTATED RINGERS IV SOLN
INTRAVENOUS | Status: DC
  Administered 2014-09-08 (×2): via INTRAVENOUS
  Filled 2014-09-08: qty 1000

## 2014-09-08 MED ORDER — CIPROFLOXACIN IN D5W 400 MG/200ML IV SOLN
INTRAVENOUS | Status: AC
  Filled 2014-09-08: qty 200

## 2014-09-08 MED ORDER — CIPROFLOXACIN IN D5W 400 MG/200ML IV SOLN
400.0000 mg | INTRAVENOUS | Status: AC
  Administered 2014-09-08: 400 mg via INTRAVENOUS
  Filled 2014-09-08: qty 200

## 2014-09-08 MED ORDER — KETOROLAC TROMETHAMINE 30 MG/ML IJ SOLN
INTRAMUSCULAR | Status: DC | PRN
  Administered 2014-09-08: 30 mg via INTRAVENOUS

## 2014-09-08 MED ORDER — STERILE WATER FOR IRRIGATION IR SOLN
Status: DC | PRN
  Administered 2014-09-08: 3000 mL

## 2014-09-08 MED ORDER — ONDANSETRON HCL 4 MG/2ML IJ SOLN
INTRAMUSCULAR | Status: DC | PRN
  Administered 2014-09-08: 4 mg via INTRAVENOUS

## 2014-09-08 MED ORDER — LACTATED RINGERS IV SOLN
INTRAVENOUS | Status: DC
  Filled 2014-09-08: qty 1000

## 2014-09-08 MED ORDER — DEXAMETHASONE SODIUM PHOSPHATE 4 MG/ML IJ SOLN
INTRAMUSCULAR | Status: DC | PRN
  Administered 2014-09-08: 10 mg via INTRAVENOUS

## 2014-09-08 MED ORDER — PROPOFOL 10 MG/ML IV BOLUS
INTRAVENOUS | Status: DC | PRN
  Administered 2014-09-08: 200 mg via INTRAVENOUS

## 2014-09-08 MED ORDER — PHENAZOPYRIDINE HCL 200 MG PO TABS
ORAL | Status: DC | PRN
  Administered 2014-09-08: 08:00:00 via INTRAVESICAL

## 2014-09-08 MED ORDER — MIDAZOLAM HCL 2 MG/2ML IJ SOLN
INTRAMUSCULAR | Status: AC
  Filled 2014-09-08: qty 2

## 2014-09-08 MED ORDER — MIDAZOLAM HCL 5 MG/5ML IJ SOLN
INTRAMUSCULAR | Status: DC | PRN
  Administered 2014-09-08: 2 mg via INTRAVENOUS

## 2014-09-08 SURGICAL SUPPLY — 21 items
BAG DRAIN URO-CYSTO SKYTR STRL (DRAIN) ×2 IMPLANT
BAG DRN UROCATH (DRAIN) ×1
CANISTER SUCT LVC 12 LTR MEDI- (MISCELLANEOUS) ×1 IMPLANT
CATHETER ×1 IMPLANT
CLOTH BEACON ORANGE TIMEOUT ST (SAFETY) ×2 IMPLANT
ELECT REM PT RETURN 9FT ADLT (ELECTROSURGICAL)
ELECTRODE REM PT RTRN 9FT ADLT (ELECTROSURGICAL) IMPLANT
GLOVE INDICATOR 6.5 STRL GRN (GLOVE) ×2 IMPLANT
GLOVE OPTIFIT SS 6.5 STRL BRWN (GLOVE) ×1 IMPLANT
GLOVE SURG SS PI 7.5 STRL IVOR (GLOVE) ×1 IMPLANT
GOWN STRL REIN XL XLG (GOWN DISPOSABLE) ×1 IMPLANT
GOWN STRL REUS W/ TWL LRG LVL3 (GOWN DISPOSABLE) IMPLANT
GOWN STRL REUS W/ TWL XL LVL3 (GOWN DISPOSABLE) ×1 IMPLANT
GOWN STRL REUS W/TWL LRG LVL3 (GOWN DISPOSABLE) ×2
GOWN STRL REUS W/TWL XL LVL3 (GOWN DISPOSABLE) ×2
NDL SAFETY ECLIPSE 18X1.5 (NEEDLE) ×1 IMPLANT
NEEDLE HYPO 18GX1.5 SHARP (NEEDLE) ×2
PACK CYSTO (CUSTOM PROCEDURE TRAY) ×2 IMPLANT
SUT SILK 0 TIES 10X30 (SUTURE) IMPLANT
SYR 20CC LL (SYRINGE) ×2 IMPLANT
WATER STERILE IRR 3000ML UROMA (IV SOLUTION) ×2 IMPLANT

## 2014-09-08 NOTE — Interval H&P Note (Signed)
History and Physical Interval Note:  09/08/2014 7:13 AM  Yolanda Yang  has presented today for surgery, with the diagnosis of PELVIC PAIN  The various methods of treatment have been discussed with the patient and family. After consideration of risks, benefits and other options for treatment, the patient has consented to  Procedure(s): CYSTOSCOPY/BIOPSY/HYDRODISTENSION OF BLADDER (N/A) as a surgical intervention .  The patient's history has been reviewed, patient examined, no change in status, stable for surgery.  I have reviewed the patient's chart and labs.  Questions were answered to the patient's satisfaction.     Stacie Knutzen A

## 2014-09-08 NOTE — Anesthesia Postprocedure Evaluation (Signed)
  Anesthesia Post-op Note  Patient: Yolanda Yang  Procedure(s) Performed: Procedure(s) (LRB): CYSTOSCOPY/ HYDRODISTENSION OF BLADDER/ INSTILLATION OF MARCAINE AND PYRIDIUM (N/A)  Patient Location: PACU  Anesthesia Type: General  Level of Consciousness: awake and alert   Airway and Oxygen Therapy: Patient Spontanous Breathing  Post-op Pain: mild  Post-op Assessment: Post-op Vital signs reviewed, Patient's Cardiovascular Status Stable, Respiratory Function Stable, Patent Airway and No signs of Nausea or vomiting  Last Vitals:  Filed Vitals:   09/08/14 0927  BP: 122/60  Pulse: 67  Temp: 37.1 C  Resp: 16    Post-op Vital Signs: stable   Complications: No apparent anesthesia complications

## 2014-09-08 NOTE — Transfer of Care (Signed)
Immediate Anesthesia Transfer of Care Note  Patient: Yolanda Yang  Procedure(s) Performed: Procedure(s): CYSTOSCOPY/ HYDRODISTENSION OF BLADDER/ INSTILLATION OF MARCAINE AND PYRIDIUM (N/A)  Patient Location: PACU  Anesthesia Type:General  Level of Consciousness: awake, alert  and oriented  Airway & Oxygen Therapy: Patient Spontanous Breathing and Patient connected to nasal cannula oxygen  Post-op Assessment: Report given to RN  Post vital signs: Reviewed and stable  Last Vitals:  Filed Vitals:   09/08/14 0619  BP: 127/71  Pulse: 81  Temp: 37.2 C  Resp: 16    Complications: No apparent anesthesia complications

## 2014-09-08 NOTE — Op Note (Signed)
Preoperative diagnosis: Interstitial cystitis Postoperative diagnosis: Interstitial cystitis Surgery: Cystoscopy bladder hydrodistention and bladder installation therapy Surgeon: Dr. Nicki Reaper Dezmin Kittelson  The patient has the above diagnoses consented above procedure. 82 [place] scope was utilized. Bladder mucosa and trigone were normal. She is [facility] for 5 minutes to approxi-5 diagnoses. Bladder was emptied. She diffuse glomerulations with no ulcers.  The bladder was emptied  A separate separate procedure instilled 15 mL of 0.5% Marcaine +400 mg of pyridium  Pt taken to recovery

## 2014-09-08 NOTE — Anesthesia Procedure Notes (Signed)
Procedure Name: LMA Insertion Date/Time: 09/08/2014 7:43 AM Performed by: Bethena Roys T Pre-anesthesia Checklist: Patient identified, Emergency Drugs available, Suction available and Patient being monitored Patient Re-evaluated:Patient Re-evaluated prior to inductionOxygen Delivery Method: Circle System Utilized Preoxygenation: Pre-oxygenation with 100% oxygen Intubation Type: IV induction Ventilation: Mask ventilation without difficulty LMA: LMA inserted LMA Size: 4.0 Number of attempts: 1 Airway Equipment and Method: Bite block Placement Confirmation: positive ETCO2 Tube secured with: Tape Dental Injury: Teeth and Oropharynx as per pre-operative assessment

## 2014-09-08 NOTE — Discharge Instructions (Signed)
I have reviewed discharge instructions in detail with the patient. They will follow-up with me or their physician as scheduled. My nurse will also be calling the patients as per protocol.    CYSTOSCOPY HOME CARE INSTRUCTIONS  Activity: Rest for the remainder of the day.  Do not drive or operate equipment today.  You may resume normal activities in one to two days as instructed by your physician.   Meals: Drink plenty of liquids and eat light foods such as gelatin or soup this evening.  You may return to a normal meal plan tomorrow.  Return to Work: You may return to work in one to two days or as instructed by your physician.  Special Instructions / Symptoms: Call your physician if any of these symptoms occur:   -persistent or heavy bleeding  -bleeding which continues after first few urination  -large blood clots that are difficult to pass  -urine stream diminishes or stops completely  -fever equal to or higher than 101 degrees Farenheit.  -cloudy urine with a strong, foul odor  -severe pain  Females should always wipe from front to back after elimination.  You may feel some burning pain when you urinate.  This should disappear with time.  Applying moist heat to the lower abdomen or a hot tub bath may help relieve the pain. \   Hydrodistention of the Bladder Hydrodistention is a procedure to examine your bladder. During hydrodistention, your bladder is filled with fluid until it is more full than normal (distended). Your caregiver will use a thin tube with a camera on the end to examine your urethra and bladder (cystoscope). LET YOUR CAREGIVER KNOW ABOUT:  Any allergies you have.  All medicines you are taking, including vitamins, herbs, eyedrops, and over-the-counter medicines and creams.  Previous problems you or members of your family have had with the use of anesthetics.  Any blood disorders you have.  Other health problems you have. RISKS AND COMPLICATIONS Generally,  hydrodistention is a safe procedure. However, as with any surgical procedure, complications can occur. Possible complications associated with hydrodistention include:  Bleeding.  Infection.  Bladder puncture.  Difficulty urinating.  Temporary inability to urinate. If this happens you may need a tube to drain urine from your bladder outside of your body (urinary catheter) for a short period after your procedure. BEFORE THE PROCEDURE  Do not eat or drink anything for at least 6 hours before your procedure.  Make plans for someone to drive you home after the procedure. PROCEDURE Hydrodistention of the bladder is an outpatient procedure. You will not need to stay overnight. It may be done in a hospital or in a surgery clinic. It usually takes about 30 minutes.  Small monitors will be placed on your body. They are used to check your heart rate, blood pressure level, and oxygen level during the procedure. An intravenous (IV) tube will be inserted into one of your veins. Fluids and medicine will flow directly into your body through the IV tube. You may be given medicine to make you sleep (general anesthetic) or you may be given medicine that makes you numb from the waist down (spinal anesthesia). Your caregiver will gently glide the cystoscope into the tube through which urine flows out of your body (urethra) all the way to your bladder. Once in your bladder, fluid will flow through the cystoscope until your bladder is full. Your caregiver will measure the amount of fluid it takes to fill your bladder. If your caregiver notices  any abnormal tissue in your bladder, a tissue sample will be removed and sent to a lab to be examined (biopsy). When your caregiver has finished the exam, the fluid will be drained from your bladder and the cystoscope will be removed. AFTER THE PROCEDURE You will stay in a recovery area until the anesthetic wears off. Your pulse and blood pressure will be frequently monitored  until you are stable. Then you can go home. Document Released: 03/11/2012 Document Reviewed: 03/11/2012 [facility] Patient Information 2015 [place]. This information is not intended to replace advice given to you by your health care provider. Make sure you discuss any questions you have with your health care provider.    Post Anesthesia Home Care Instructions  Activity: Get plenty of rest for the remainder of the day. A responsible adult should stay with you for 24 hours following the procedure.  For the next 24 hours, DO NOT: -Drive a car -Paediatric nurse -Drink alcoholic beverages -Take any medication unless instructed by your physician -Make any legal decisions or sign important papers.  Meals: Start with liquid foods such as gelatin or soup. Progress to regular foods as tolerated. Avoid greasy, spicy, heavy foods. If nausea and/or vomiting occur, drink only clear liquids until the nausea and/or vomiting subsides. Call your physician if vomiting continues.  Special Instructions/Symptoms: Your throat may feel dry or sore from the anesthesia or the breathing tube placed in your throat during surgery. If this causes discomfort, gargle with warm salt water. The discomfort should disappear within 24 hours.

## 2014-09-09 ENCOUNTER — Encounter (HOSPITAL_BASED_OUTPATIENT_CLINIC_OR_DEPARTMENT_OTHER): Payer: Self-pay | Admitting: Urology

## 2014-09-13 DIAGNOSIS — M461 Sacroiliitis, not elsewhere classified: Secondary | ICD-10-CM | POA: Diagnosis not present

## 2014-09-13 DIAGNOSIS — G894 Chronic pain syndrome: Secondary | ICD-10-CM | POA: Diagnosis not present

## 2014-09-13 DIAGNOSIS — F1120 Opioid dependence, uncomplicated: Secondary | ICD-10-CM | POA: Diagnosis not present

## 2014-09-14 DIAGNOSIS — Z0000 Encounter for general adult medical examination without abnormal findings: Secondary | ICD-10-CM | POA: Diagnosis not present

## 2014-09-21 DIAGNOSIS — N3010 Interstitial cystitis (chronic) without hematuria: Secondary | ICD-10-CM | POA: Diagnosis not present

## 2014-09-27 DIAGNOSIS — N3010 Interstitial cystitis (chronic) without hematuria: Secondary | ICD-10-CM | POA: Diagnosis not present

## 2014-09-29 ENCOUNTER — Other Ambulatory Visit: Payer: Self-pay | Admitting: Gastroenterology

## 2014-09-29 DIAGNOSIS — R1310 Dysphagia, unspecified: Secondary | ICD-10-CM

## 2014-09-29 DIAGNOSIS — K5900 Constipation, unspecified: Secondary | ICD-10-CM | POA: Diagnosis not present

## 2014-09-29 DIAGNOSIS — K219 Gastro-esophageal reflux disease without esophagitis: Secondary | ICD-10-CM | POA: Diagnosis not present

## 2014-10-05 DIAGNOSIS — R109 Unspecified abdominal pain: Secondary | ICD-10-CM | POA: Diagnosis not present

## 2014-10-05 DIAGNOSIS — N3010 Interstitial cystitis (chronic) without hematuria: Secondary | ICD-10-CM | POA: Diagnosis not present

## 2014-10-06 DIAGNOSIS — N390 Urinary tract infection, site not specified: Secondary | ICD-10-CM | POA: Diagnosis not present

## 2014-10-06 DIAGNOSIS — N760 Acute vaginitis: Secondary | ICD-10-CM | POA: Diagnosis not present

## 2014-10-07 ENCOUNTER — Ambulatory Visit (HOSPITAL_COMMUNITY): Admission: RE | Admit: 2014-10-07 | Payer: Medicare HMO | Source: Ambulatory Visit

## 2014-10-10 ENCOUNTER — Ambulatory Visit: Payer: Commercial Managed Care - HMO | Admitting: Internal Medicine

## 2014-10-12 ENCOUNTER — Ambulatory Visit (HOSPITAL_COMMUNITY)
Admission: RE | Admit: 2014-10-12 | Discharge: 2014-10-12 | Disposition: A | Discharge status: Home / Self Care | Payer: Commercial Managed Care - HMO | Source: Ambulatory Visit | Attending: Gastroenterology | Admitting: Gastroenterology

## 2014-10-12 DIAGNOSIS — N3010 Interstitial cystitis (chronic) without hematuria: Secondary | ICD-10-CM | POA: Diagnosis not present

## 2014-10-12 DIAGNOSIS — R4702 Dysphasia: Secondary | ICD-10-CM | POA: Diagnosis present

## 2014-10-12 DIAGNOSIS — K224 Dyskinesia of esophagus: Secondary | ICD-10-CM | POA: Insufficient documentation

## 2014-10-12 DIAGNOSIS — R1310 Dysphagia, unspecified: Secondary | ICD-10-CM

## 2014-10-19 DIAGNOSIS — N3010 Interstitial cystitis (chronic) without hematuria: Secondary | ICD-10-CM | POA: Diagnosis not present

## 2014-10-24 ENCOUNTER — Other Ambulatory Visit: Payer: Self-pay | Admitting: Nurse Practitioner

## 2014-10-28 DIAGNOSIS — N3010 Interstitial cystitis (chronic) without hematuria: Secondary | ICD-10-CM | POA: Diagnosis not present

## 2014-10-31 ENCOUNTER — Ambulatory Visit: Payer: Commercial Managed Care - HMO | Admitting: Internal Medicine

## 2014-11-02 DIAGNOSIS — N3010 Interstitial cystitis (chronic) without hematuria: Secondary | ICD-10-CM | POA: Diagnosis not present

## 2014-11-09 DIAGNOSIS — N3010 Interstitial cystitis (chronic) without hematuria: Secondary | ICD-10-CM | POA: Diagnosis not present

## 2014-11-10 DIAGNOSIS — G894 Chronic pain syndrome: Secondary | ICD-10-CM | POA: Diagnosis not present

## 2014-11-10 DIAGNOSIS — Z79891 Long term (current) use of opiate analgesic: Secondary | ICD-10-CM | POA: Diagnosis not present

## 2014-11-10 DIAGNOSIS — M461 Sacroiliitis, not elsewhere classified: Secondary | ICD-10-CM | POA: Diagnosis not present

## 2014-11-23 ENCOUNTER — Ambulatory Visit: Payer: Commercial Managed Care - HMO | Admitting: Internal Medicine

## 2014-11-24 DIAGNOSIS — N3010 Interstitial cystitis (chronic) without hematuria: Secondary | ICD-10-CM | POA: Diagnosis not present

## 2014-11-29 ENCOUNTER — Encounter: Payer: Self-pay | Admitting: Adult Health

## 2014-11-29 ENCOUNTER — Ambulatory Visit (INDEPENDENT_AMBULATORY_CARE_PROVIDER_SITE_OTHER): Payer: Commercial Managed Care - HMO | Admitting: Adult Health

## 2014-11-29 ENCOUNTER — Ambulatory Visit (INDEPENDENT_AMBULATORY_CARE_PROVIDER_SITE_OTHER)
Admission: RE | Admit: 2014-11-29 | Discharge: 2014-11-29 | Disposition: A | Discharge status: Home / Self Care | Payer: Commercial Managed Care - HMO | Source: Ambulatory Visit | Attending: Adult Health | Admitting: Adult Health

## 2014-11-29 VITALS — BP 122/80 | HR 91 | Temp 98.1°F | Ht 65.0 in | Wt 181.0 lb

## 2014-11-29 DIAGNOSIS — J45909 Unspecified asthma, uncomplicated: Secondary | ICD-10-CM | POA: Diagnosis not present

## 2014-11-29 DIAGNOSIS — J4531 Mild persistent asthma with (acute) exacerbation: Secondary | ICD-10-CM | POA: Diagnosis not present

## 2014-11-29 DIAGNOSIS — R0602 Shortness of breath: Secondary | ICD-10-CM | POA: Diagnosis not present

## 2014-11-29 DIAGNOSIS — N3010 Interstitial cystitis (chronic) without hematuria: Secondary | ICD-10-CM | POA: Diagnosis not present

## 2014-11-29 DIAGNOSIS — R05 Cough: Secondary | ICD-10-CM | POA: Diagnosis not present

## 2014-11-29 MED ORDER — ALBUTEROL SULFATE HFA 108 (90 BASE) MCG/ACT IN AERS: 2.0000 | INHALATION_SPRAY | RESPIRATORY_TRACT | Status: DC | PRN

## 2014-11-29 MED ORDER — FLUTICASONE-SALMETEROL 250-50 MCG/DOSE IN AEPB: 1.0000 | INHALATION_SPRAY | Freq: Two times a day (BID) | RESPIRATORY_TRACT | Status: DC

## 2014-11-29 NOTE — Assessment & Plan Note (Signed)
Mild asthma flare Discussed previous systemic steroid-induced she would like to stay away from steroid-induced as possible and she has had 2 hip replacements due to avascular necrosis We'll check chest x-ray today And maximize Advair dosing.  Plan  Increase Advair 1 puff Twice daily  , rinse after use.  Chest xray today .  Follow up Dr. Chase Caller in 3-4 months  Please contact office for sooner follow up if symptoms do not improve or worsen or seek emergency care

## 2014-11-29 NOTE — Progress Notes (Signed)
Subjective:    Patient ID: Yolanda Yang, female    DOB: 07-09-1955, 59 y.o.   MRN: [id number]  HPI    OV 02/25/2014  Chief Complaint  Patient presents with  . Follow-up    Pt states she was taken off spiriva by allergy doctor. Pt c/o orthopnea and  mid sternal chest pain with activity and rest. Pt denies cough.     Followup asthma with baseline normal spirometry but a moderate persistent regimen  - Last visit was in February 2015. At that time she was doing well. Overall she continues to do well in terms of asthma except for the summer for the last week or so and with a transition in season she is having increased chest tightness, cough and wheezing. She has significant acid reflux and is having associated orthopnea. Spirometry today is essentially normal with an FEV1 of 1.80 the/81% and the ratio 71. But these numbers are reduced compared to her baseline which normally run at 100%. I offered her a short course of prednisone but she is reluctant due to prior side effects  Past medical history review: She is on chronic antibiotics for recurrent urinary tract infection.  11/29/2014. Follow up  Patient returns for a follow-up for asthma. Visit. She's noticed her asthma has not been under good control for the last 2 to 3 months. She says that she feels like the heat and pollen have been making her breathing worse. She has an intermittent wheezing and shortness of breath. She denies any chest pain ,hemoptysis, orthopnea, PND, or increased leg swelling She is on Advair but only taken once daily.    Review of Systems  Constitutional: Negative for fever and unexpected weight change.  HENT: Negative for congestion, dental problem, ear pain, nosebleeds, postnasal drip, rhinorrhea, sinus pressure, sneezing, sore throat and trouble swallowing.   Eyes: Negative for redness and itching.  Respiratory: Positive for shortness of breath. Negative for cough, chest tightness and wheezing.     Cardiovascular:   Negative for palpitations and leg swelling.  Gastrointestinal: Negative for nausea and vomiting.  Genitourinary: Negative for dysuria.  Musculoskeletal: Negative for joint swelling.  Skin: Negative for rash.  Neurological: Negative for headaches.  Hematological: Does not bruise/bleed easily.  Psychiatric/Behavioral: Negative for dysphoric mood. The patient is not nervous/anxious.        Objective:   Physical Exam   HENT:  Head: Normocephalic and atraumatic.  Right Ear: External ear normal.  Left Ear: External ear normal.  Mouth/Throat: Oropharynx is clear and moist. No oropharyngeal exudate.  Eyes: Conjunctivae and EOM are normal. Pupils are equal, round, and reactive to light. Right eye exhibits no discharge. Left eye exhibits no discharge. No scleral icterus.  Neck: Normal range of motion. Neck supple. No JVD present. No tracheal deviation present. No thyromegaly present.  Cardiovascular: Normal rate, regular rhythm, normal heart sounds and intact distal pulses.  Exam reveals no gallop and no friction rub.   No murmur heard. Pulmonary/Chest: Effort normal and breath sounds normal. No respiratory distress. She has no wheezes. She has no rales. She exhibits no tenderness.  Abdominal: Soft. Bowel sounds are normal. She exhibits no distension and no mass. There is no tenderness. There is no rebound and no guarding.  Musculoskeletal: Normal range of motion. She exhibits no edema and no tenderness.  Lymphadenopathy:    She has no cervical adenopathy.  Neurological: She is alert and oriented to person, place, and time. She has normal reflexes. No cranial nerve  deficit. She exhibits normal muscle tone. Coordination normal.  Skin: Skin is warm and dry. No rash noted. She is not diaphoretic. No erythema. No pallor.  Psychiatric: She has a normal mood and affect. Her behavior is normal. Judgment and thought content normal.          Assessment & Plan:

## 2014-11-29 NOTE — Patient Instructions (Signed)
Increase Advair 1 puff Twice daily  , rinse after use.  Chest xray today .  Follow up Dr. Chase Caller in 3-4 months  Please contact office for sooner follow up if symptoms do not improve or worsen or seek emergency care

## 2014-11-30 ENCOUNTER — Telehealth: Payer: Self-pay | Admitting: Adult Health

## 2014-11-30 MED ORDER — ALBUTEROL SULFATE HFA 108 (90 BASE) MCG/ACT IN AERS: 2.0000 | INHALATION_SPRAY | RESPIRATORY_TRACT | Status: DC | PRN

## 2014-11-30 NOTE — Telephone Encounter (Signed)
Rx has been resent. Pt is aware. Nothing further was needed.

## 2014-12-02 ENCOUNTER — Other Ambulatory Visit: Payer: Self-pay | Admitting: Adult Health

## 2014-12-02 MED ORDER — ALBUTEROL SULFATE HFA 108 (90 BASE) MCG/ACT IN AERS: 2.0000 | INHALATION_SPRAY | RESPIRATORY_TRACT | Status: DC | PRN

## 2014-12-02 NOTE — Telephone Encounter (Signed)
Received fax from [facility] rx was sent 6.1.16 Fax states that insurance pays for [employer] and asks if okay to change  Change made on med list and new rx sent

## 2014-12-02 NOTE — Progress Notes (Signed)
Quick Note:  Called and spoke to pt. Informed her of the results and recs per PT. Pt verbalized understanding and denied any further questions or concerns at this time.   ______

## 2014-12-05 ENCOUNTER — Telehealth: Payer: Self-pay | Admitting: Adult Health

## 2014-12-05 NOTE — Telephone Encounter (Signed)
We do not have samples of ProAir or albuterol at this time. Pt is aware. Nothing further was needed.

## 2014-12-06 DIAGNOSIS — R350 Frequency of micturition: Secondary | ICD-10-CM | POA: Diagnosis not present

## 2014-12-06 DIAGNOSIS — N3010 Interstitial cystitis (chronic) without hematuria: Secondary | ICD-10-CM | POA: Diagnosis not present

## 2014-12-13 DIAGNOSIS — G894 Chronic pain syndrome: Secondary | ICD-10-CM | POA: Diagnosis not present

## 2014-12-13 DIAGNOSIS — Z79891 Long term (current) use of opiate analgesic: Secondary | ICD-10-CM | POA: Diagnosis not present

## 2014-12-13 DIAGNOSIS — M461 Sacroiliitis, not elsewhere classified: Secondary | ICD-10-CM | POA: Diagnosis not present

## 2014-12-15 DIAGNOSIS — N3010 Interstitial cystitis (chronic) without hematuria: Secondary | ICD-10-CM | POA: Diagnosis not present

## 2014-12-19 DIAGNOSIS — N760 Acute vaginitis: Secondary | ICD-10-CM | POA: Diagnosis not present

## 2014-12-20 ENCOUNTER — Telehealth: Payer: Self-pay | Admitting: Adult Health

## 2014-12-20 DIAGNOSIS — Z0289 Encounter for other administrative examinations: Secondary | ICD-10-CM | POA: Diagnosis not present

## 2014-12-20 DIAGNOSIS — R209 Unspecified disturbances of skin sensation: Secondary | ICD-10-CM | POA: Diagnosis not present

## 2014-12-20 DIAGNOSIS — J449 Chronic obstructive pulmonary disease, unspecified: Secondary | ICD-10-CM | POA: Diagnosis not present

## 2014-12-20 MED ORDER — ALBUTEROL SULFATE HFA 108 (90 BASE) MCG/ACT IN AERS: 2.0000 | INHALATION_SPRAY | RESPIRATORY_TRACT | Status: DC | PRN

## 2014-12-20 MED ORDER — FLUTICASONE-SALMETEROL 250-50 MCG/DOSE IN AEPB: 1.0000 | INHALATION_SPRAY | Freq: Two times a day (BID) | RESPIRATORY_TRACT | Status: DC

## 2014-12-20 NOTE — Telephone Encounter (Signed)
Called pt. She needs her advair and proair sent to [facility] order. rx's sent in. Nothing further needed

## 2014-12-26 ENCOUNTER — Telehealth: Payer: Self-pay | Admitting: Internal Medicine

## 2014-12-26 DIAGNOSIS — N3011 Interstitial cystitis (chronic) with hematuria: Secondary | ICD-10-CM | POA: Diagnosis not present

## 2014-12-26 NOTE — Telephone Encounter (Signed)
lmomtcb x1 for pt 

## 2014-12-27 NOTE — Telephone Encounter (Signed)
Pt is asking if we can change her Albuterol neb solution to Q12hrs and send a new rx to [facility]. She says she can get it for free through [facility] for free if it is Q12hrs. She states she only uses it about 4 times a week. Please advise.

## 2014-12-27 NOTE — Telephone Encounter (Signed)
Yeah that is fine 

## 2014-12-27 NOTE — Telephone Encounter (Signed)
lmomtcb x2 for pt on VM

## 2014-12-27 NOTE — Telephone Encounter (Signed)
Patient returning call. Wanting qty chgd of nebulizer because she cannot afford.  PT can be reached at [phone].

## 2014-12-28 ENCOUNTER — Other Ambulatory Visit: Payer: Self-pay | Admitting: Radiology

## 2014-12-28 DIAGNOSIS — Z96643 Presence of artificial hip joint, bilateral: Secondary | ICD-10-CM | POA: Diagnosis not present

## 2014-12-28 DIAGNOSIS — I671 Cerebral aneurysm, nonruptured: Secondary | ICD-10-CM

## 2014-12-28 DIAGNOSIS — M545 Low back pain: Secondary | ICD-10-CM | POA: Diagnosis not present

## 2014-12-28 DIAGNOSIS — Z87891 Personal history of nicotine dependence: Secondary | ICD-10-CM | POA: Diagnosis not present

## 2014-12-28 DIAGNOSIS — Z881 Allergy status to other antibiotic agents status: Secondary | ICD-10-CM | POA: Diagnosis not present

## 2014-12-28 DIAGNOSIS — Z471 Aftercare following joint replacement surgery: Secondary | ICD-10-CM | POA: Diagnosis not present

## 2014-12-28 DIAGNOSIS — M7061 Trochanteric bursitis, right hip: Secondary | ICD-10-CM | POA: Diagnosis not present

## 2014-12-28 DIAGNOSIS — M7062 Trochanteric bursitis, left hip: Secondary | ICD-10-CM | POA: Diagnosis not present

## 2014-12-28 MED ORDER — ALBUTEROL SULFATE (2.5 MG/3ML) 0.083% IN NEBU: INHALATION_SOLUTION | RESPIRATORY_TRACT | Status: DC

## 2014-12-28 NOTE — Telephone Encounter (Signed)
lmomtcb x1 for pt RX sent in 

## 2014-12-29 ENCOUNTER — Ambulatory Visit
Admission: RE | Admit: 2014-12-29 | Discharge: 2014-12-29 | Disposition: A | Discharge status: Home / Self Care | Payer: Commercial Managed Care - HMO | Source: Ambulatory Visit | Attending: Radiology | Admitting: Radiology

## 2014-12-29 DIAGNOSIS — I671 Cerebral aneurysm, nonruptured: Secondary | ICD-10-CM | POA: Diagnosis not present

## 2014-12-29 NOTE — Telephone Encounter (Signed)
lmomtcb x1 

## 2014-12-29 NOTE — Telephone Encounter (Signed)
lmtcb X2 for pt to make aware that med has been changed and sent in.

## 2014-12-29 NOTE — Telephone Encounter (Signed)
Pt cb, requesting rx for ventolin instead of proair, [facility] order pharm [phone]

## 2014-12-30 ENCOUNTER — Other Ambulatory Visit: Payer: Commercial Managed Care - HMO

## 2014-12-30 MED ORDER — ALBUTEROL SULFATE HFA 108 (90 BASE) MCG/ACT IN AERS: 2.0000 | INHALATION_SPRAY | RESPIRATORY_TRACT | Status: DC | PRN

## 2014-12-30 NOTE — Telephone Encounter (Signed)
Pt aware that meds have been sent to new pharmacy -- [place] Pt requests that her Proair be changed to Ventolin - requests this be sent to [facility] d/t Rx being too expensive at local pharmacy. This has been sent into [facility] as well.  Nothing further needed.

## 2014-12-30 NOTE — Telephone Encounter (Signed)
Pt returning call - [phone]

## 2014-12-30 NOTE — Telephone Encounter (Signed)
LMTCB x2  

## 2014-12-30 NOTE — Telephone Encounter (Signed)
atc pt, line rang several times, was picked up but nobody on the other line.  Hung up after voicing myself several times.  Wcb.

## 2014-12-30 NOTE — Telephone Encounter (Signed)
Pt returned call - [phone]

## 2015-01-04 DIAGNOSIS — R7309 Other abnormal glucose: Secondary | ICD-10-CM | POA: Diagnosis not present

## 2015-01-04 DIAGNOSIS — E785 Hyperlipidemia, unspecified: Secondary | ICD-10-CM | POA: Diagnosis not present

## 2015-01-24 DIAGNOSIS — Z79891 Long term (current) use of opiate analgesic: Secondary | ICD-10-CM | POA: Diagnosis not present

## 2015-01-24 DIAGNOSIS — G894 Chronic pain syndrome: Secondary | ICD-10-CM | POA: Diagnosis not present

## 2015-01-24 DIAGNOSIS — M461 Sacroiliitis, not elsewhere classified: Secondary | ICD-10-CM | POA: Diagnosis not present

## 2015-01-24 DIAGNOSIS — G64 Other disorders of peripheral nervous system: Secondary | ICD-10-CM | POA: Diagnosis not present

## 2015-02-09 DIAGNOSIS — K5900 Constipation, unspecified: Secondary | ICD-10-CM | POA: Diagnosis not present

## 2015-02-09 DIAGNOSIS — R1013 Epigastric pain: Secondary | ICD-10-CM | POA: Diagnosis not present

## 2015-02-09 DIAGNOSIS — K219 Gastro-esophageal reflux disease without esophagitis: Secondary | ICD-10-CM | POA: Diagnosis not present

## 2015-03-02 DIAGNOSIS — M7061 Trochanteric bursitis, right hip: Secondary | ICD-10-CM | POA: Diagnosis not present

## 2015-03-02 DIAGNOSIS — Z7982 Long term (current) use of aspirin: Secondary | ICD-10-CM | POA: Diagnosis not present

## 2015-03-02 DIAGNOSIS — M7062 Trochanteric bursitis, left hip: Secondary | ICD-10-CM | POA: Diagnosis not present

## 2015-03-02 DIAGNOSIS — I10 Essential (primary) hypertension: Secondary | ICD-10-CM | POA: Diagnosis not present

## 2015-03-21 DIAGNOSIS — R102 Pelvic and perineal pain: Secondary | ICD-10-CM | POA: Diagnosis not present

## 2015-03-21 DIAGNOSIS — N390 Urinary tract infection, site not specified: Secondary | ICD-10-CM | POA: Diagnosis not present

## 2015-03-23 ENCOUNTER — Ambulatory Visit: Payer: Commercial Managed Care - HMO | Admitting: Adult Health

## 2015-03-27 DIAGNOSIS — G64 Other disorders of peripheral nervous system: Secondary | ICD-10-CM | POA: Diagnosis not present

## 2015-03-27 DIAGNOSIS — G894 Chronic pain syndrome: Secondary | ICD-10-CM | POA: Diagnosis not present

## 2015-03-27 DIAGNOSIS — M461 Sacroiliitis, not elsewhere classified: Secondary | ICD-10-CM | POA: Diagnosis not present

## 2015-03-29 DIAGNOSIS — H43813 Vitreous degeneration, bilateral: Secondary | ICD-10-CM | POA: Diagnosis not present

## 2015-03-29 DIAGNOSIS — H10413 Chronic giant papillary conjunctivitis, bilateral: Secondary | ICD-10-CM | POA: Diagnosis not present

## 2015-03-29 DIAGNOSIS — H2513 Age-related nuclear cataract, bilateral: Secondary | ICD-10-CM | POA: Diagnosis not present

## 2015-03-29 DIAGNOSIS — H04123 Dry eye syndrome of bilateral lacrimal glands: Secondary | ICD-10-CM | POA: Diagnosis not present

## 2015-04-07 ENCOUNTER — Ambulatory Visit: Payer: Commercial Managed Care - HMO | Admitting: Adult Health

## 2015-04-10 ENCOUNTER — Other Ambulatory Visit: Payer: Self-pay

## 2015-04-10 DIAGNOSIS — Z1231 Encounter for screening mammogram for malignant neoplasm of breast: Secondary | ICD-10-CM

## 2015-04-27 ENCOUNTER — Other Ambulatory Visit: Payer: Self-pay | Admitting: Adult Health

## 2015-05-02 DIAGNOSIS — J45909 Unspecified asthma, uncomplicated: Secondary | ICD-10-CM | POA: Diagnosis not present

## 2015-05-02 DIAGNOSIS — M21371 Foot drop, right foot: Secondary | ICD-10-CM | POA: Diagnosis not present

## 2015-05-02 DIAGNOSIS — Z79899 Other long term (current) drug therapy: Secondary | ICD-10-CM | POA: Diagnosis not present

## 2015-05-02 DIAGNOSIS — E785 Hyperlipidemia, unspecified: Secondary | ICD-10-CM | POA: Diagnosis not present

## 2015-05-02 DIAGNOSIS — Z7982 Long term (current) use of aspirin: Secondary | ICD-10-CM | POA: Diagnosis not present

## 2015-05-02 DIAGNOSIS — G8929 Other chronic pain: Secondary | ICD-10-CM | POA: Diagnosis not present

## 2015-05-02 DIAGNOSIS — Z96651 Presence of right artificial knee joint: Secondary | ICD-10-CM | POA: Diagnosis not present

## 2015-05-02 DIAGNOSIS — Z87891 Personal history of nicotine dependence: Secondary | ICD-10-CM | POA: Diagnosis not present

## 2015-05-02 DIAGNOSIS — I10 Essential (primary) hypertension: Secondary | ICD-10-CM | POA: Diagnosis not present

## 2015-05-03 ENCOUNTER — Other Ambulatory Visit: Payer: Self-pay | Admitting: Internal Medicine

## 2015-05-12 ENCOUNTER — Ambulatory Visit: Payer: Commercial Managed Care - HMO | Admitting: Internal Medicine

## 2015-05-15 ENCOUNTER — Ambulatory Visit: Payer: Commercial Managed Care - HMO

## 2015-05-23 DIAGNOSIS — F1120 Opioid dependence, uncomplicated: Secondary | ICD-10-CM | POA: Diagnosis not present

## 2015-05-23 DIAGNOSIS — G64 Other disorders of peripheral nervous system: Secondary | ICD-10-CM | POA: Diagnosis not present

## 2015-05-23 DIAGNOSIS — Z79891 Long term (current) use of opiate analgesic: Secondary | ICD-10-CM | POA: Diagnosis not present

## 2015-05-23 DIAGNOSIS — G8929 Other chronic pain: Secondary | ICD-10-CM | POA: Diagnosis not present

## 2015-06-03 ENCOUNTER — Encounter (HOSPITAL_COMMUNITY): Payer: Self-pay | Admitting: Emergency Medicine

## 2015-06-03 ENCOUNTER — Emergency Department (INDEPENDENT_AMBULATORY_CARE_PROVIDER_SITE_OTHER)
Admission: EM | Admit: 2015-06-03 | Discharge: 2015-06-03 | Disposition: A | Discharge status: Home / Self Care | Payer: Commercial Managed Care - HMO | Source: Home / Self Care | Attending: Family Medicine | Admitting: Family Medicine

## 2015-06-03 DIAGNOSIS — J4541 Moderate persistent asthma with (acute) exacerbation: Secondary | ICD-10-CM | POA: Diagnosis not present

## 2015-06-03 MED ORDER — ALBUTEROL SULFATE HFA 108 (90 BASE) MCG/ACT IN AERS
2.0000 | INHALATION_SPRAY | Freq: Once | RESPIRATORY_TRACT | Status: AC
  Administered 2015-06-03: 2 via RESPIRATORY_TRACT

## 2015-06-03 MED ORDER — ALBUTEROL SULFATE (2.5 MG/3ML) 0.083% IN NEBU
INHALATION_SOLUTION | RESPIRATORY_TRACT | Status: AC
  Filled 2015-06-03: qty 3

## 2015-06-03 MED ORDER — IPRATROPIUM-ALBUTEROL 0.5-2.5 (3) MG/3ML IN SOLN
3.0000 mL | Freq: Once | RESPIRATORY_TRACT | Status: AC
  Administered 2015-06-03: 3 mL via RESPIRATORY_TRACT

## 2015-06-03 MED ORDER — DEXAMETHASONE SODIUM PHOSPHATE 10 MG/ML IJ SOLN
10.0000 mg | Freq: Once | INTRAMUSCULAR | Status: AC
  Administered 2015-06-03: 10 mg via INTRAMUSCULAR

## 2015-06-03 MED ORDER — PREDNISONE 20 MG PO TABS: ORAL_TABLET | ORAL | Status: DC

## 2015-06-03 MED ORDER — DEXAMETHASONE SODIUM PHOSPHATE 10 MG/ML IJ SOLN
INTRAMUSCULAR | Status: AC
  Filled 2015-06-03: qty 1

## 2015-06-03 MED ORDER — IPRATROPIUM-ALBUTEROL 0.5-2.5 (3) MG/3ML IN SOLN
RESPIRATORY_TRACT | Status: AC
  Filled 2015-06-03: qty 3

## 2015-06-03 NOTE — ED Notes (Signed)
Asthma uncontrolled for 2 weeks.  Reports using inhalers and nebulizers, patient reports symptoms worsen when lying down and medicines offer no relief.  Patient reports a cough, non-productive cough, no fever.

## 2015-06-03 NOTE — ED Provider Notes (Signed)
CSN: [id number]     Arrival date & time 06/03/15  1829 History   First MD Initiated Contact with Patient 06/03/15 1928     Chief Complaint  Patient presents with  . Asthma   (Consider location/radiation/quality/duration/timing/severity/associated sxs/prior Treatment) HPI Comments: 59 year old female with a history of asthma for 20 years and a previous smoker is complaining of an exacerbation of her asthma in the past 2 weeks. She uses albuterol nebulizer as well as her albuterol inhaler and Advair. She is complaining of shortness of breath especially with exertion. She is complaining of dyspnea now. She is fully alert and appears to be no distress while at rest.   Past Medical History  Diagnosis Date  . Hyperlipemia   . GERD (gastroesophageal reflux disease)   . IBS (irritable bowel syndrome)   . Cystitis, interstitial   . Anxiety   . History of cerebral aneurysm repair     MULTIPLE  ANEURYSM--  S/P  COILING X4  ([facility])  . Borderline type 2 diabetes mellitus     DIET CONTROLLED  . History of chronic sinusitis   . Seasonal allergic rhinitis   . Asthma exacerbation     PULMOLOGIST-- DR RAMASWANY  . History of gastric ulcer   . History of duodenal ulcer     2007  . History of GI bleed     UPPER--  2007  AND 05-2011  . Chronic constipation   . Right foot drop     POST LUMBAR SURGERY 2011  . Cerebral aneurysm, nonruptured monitored at [facility] by dr Anne Shutter    currently has x4 stable aneurysm's (x2 left side & x2 right side)--  2012 s/p coiling of 4 aneurysm's  . Bladder pain   . Incomplete emptying of bladder   . Urinary hesitancy   . History of recurrent UTIs   . Headache syndrome     due to cerebral aneurysm's  . Wears glasses   . OA (osteoarthritis)   . Wears dentures     currently being fitted for full dentures   Past Surgical History  Procedure Laterality Date  . Esophagogastroduodenoscopy  05/17/2011    Procedure: ESOPHAGOGASTRODUODENOSCOPY (EGD);  Surgeon:  Beryle Beams;  Location: [facility] ENDOSCOPY;  Service: Endoscopy;  Laterality: N/A;  . Breast reduction surgery Bilateral 06/09/2014    Procedure: BILATERAL MAMMARY REDUCTION  (BREAST);  Surgeon: Irene Limbo, MD;  Location: [place];  Service: Plastics;  Laterality: Bilateral;  . Posterior laminectomy / decompression lumbar spine  05-17-2010    L3 -- L5  . Left breast lumpectomy  2000    benign  . Anterior cervical decomp/discectomy fusion  2002    C4 -- C7  . Rotator cuff repair Right 2014  . Aneurysm coiling  05-08-2011     [facility]    INTRACRANIAL COILING X4  and Right Carotid stenting due to intraoperative injury  . Cysto/  hydrodistention/  instillation therapy  07-15-2006  . Tonsillectomy  age 21  . Abdominal hysterectomy  1997  . Total hip arthroplasty Bilateral may & july 2013  . Posterior fusion cervical spine  2004    Revision C4 -- C7  . Negative sleep study  2011  PER PT  . Cystoscopy with hydrodistension and biopsy N/A 09/08/2014    Procedure: CYSTOSCOPY/ HYDRODISTENSION OF BLADDER/ INSTILLATION OF MARCAINE AND PYRIDIUM;  Surgeon: Bjorn Loser, MD;  Location: [place];  Service: Urology;  Laterality: N/A;   Family History  Problem Relation Age of Onset  .  Colon cancer Father   . Leukemia Father    Social History  Substance Use Topics  . Smoking status: Former Smoker -- 0.50 packs/day for 20 years    Types: Cigarettes    Quit date: 08/02/2011  . Smokeless tobacco: Never Used  . Alcohol Use: No   OB History    No data available     Review of Systems  Constitutional: Positive for activity change. Negative for fever.  HENT: Negative.   Respiratory: Positive for shortness of breath and wheezing.   Cardiovascular: Negative for chest pain.  Gastrointestinal: Negative.   Musculoskeletal: Negative.   Skin: Negative.     Allergies  Erythromycin and Latex  Home Medications   Prior to Admission medications   Medication Sig Start Date End Date  Taking? Authorizing Provider  DULoxetine (CYMBALTA) 60 MG capsule Take 60 mg by mouth every evening.    Yes Historical Provider, MD  fexofenadine (ALLEGRA) 180 MG tablet Take 180 mg by mouth daily as needed for allergies or rhinitis.   Yes Historical Provider, MD  gabapentin (NEURONTIN) 600 MG tablet Take 600 mg by mouth 3 (three) times daily.   Yes Historical Provider, MD  HYDROmorphone (DILAUDID) 4 MG tablet Take 1-1.5 tablets (4-6 mg total) by mouth every 4 (four) hours as needed for severe pain. 06/10/14  Yes Irene Limbo, MD  Linaclotide (LINZESS) 290 MCG CAPS capsule Take 290 mcg by mouth every evening.    Yes Historical Provider, MD  pentosan polysulfate (ELMIRON) 100 MG capsule Take 100 mg by mouth 2 (two) times daily.    Yes Historical Provider, MD  rosuvastatin (CRESTOR) 10 MG tablet Take 10 mg by mouth every evening.    Yes Historical Provider, MD  acetaminophen (TYLENOL) 650 MG CR tablet Take 1,300 mg by mouth every 8 (eight) hours as needed for pain.    Historical Provider, MD  ADVAIR DISKUS 250-50 MCG/DOSE AEPB INHALE 1 PUFF INTO THE LUNGS 2 (TWO) TIMES DAILY. 04/28/15   Brand Males, MD  albuterol (PROVENTIL) (2.5 MG/3ML) 0.083% nebulizer solution INHALE THE CONTENTS OF 1 VIAL IN NEBULIZER EVERY 12 HOURS AS NEEDED 05/03/15   Brand Males, MD  albuterol (VENTOLIN HFA) 108 (90 BASE) MCG/ACT inhaler Inhale 2 puffs into the lungs every 4 (four) hours as needed for wheezing or shortness of breath. 12/30/14   Brand Males, MD  aspirin 81 MG chewable tablet Chew 81 mg by mouth daily.     Historical Provider, MD  butalbital-acetaminophen-caffeine (FIORICET, ESGIC) 50-325-40 MG per tablet Take 1 tablet by mouth daily as needed for headache.     Historical Provider, MD  clindamycin (CLEOCIN) 300 MG capsule Take 300 mg by mouth 4 (four) times daily.    Historical Provider, MD  cyclobenzaprine (FLEXERIL) 10 MG tablet Take 10 mg by mouth 3 (three) times daily as needed for muscle  spasms.     Historical Provider, MD  diazepam (VALIUM) 5 MG tablet Take 5 mg by mouth every 6 (six) hours as needed for anxiety.    Historical Provider, MD  guaiFENesin (MUCINEX) 600 MG 12 hr tablet Take by mouth 2 (two) times daily as needed for to loosen phlegm.    Historical Provider, MD  Meth-Hyo-M Bl-Na Phos-Ph Sal (URIBEL) 118 MG CAPS Take 2 capsules by mouth 2 (two) times daily as needed (interstitial cystitis).    Historical Provider, MD  nitrofurantoin, macrocrystal-monohydrate, (MACROBID) 100 MG capsule Take 100 mg by mouth daily.    Historical Provider, MD  nystatin cream (MYCOSTATIN)  Apply 1 application topically at bedtime. PT USES PRN QHS WHEN ON ANTIBIOTICS TO PREVENT YEAST INFECTION    Historical Provider, MD  pantoprazole (PROTONIX) 40 MG tablet Take 1 tablet (40 mg total) by mouth 2 (two) times daily before a meal. Patient not taking: Reported on 11/29/2014 05/20/11   Hosie Poisson, MD  predniSONE (DELTASONE) 20 MG tablet Take 3 tabs po on first day, 2 tabs second day, 2 tabs third day, 1 tab fourth day, 1 tab 5th day. Take with food. 06/03/15   Janne Napoleon, NP  Probiotic Product (PROBIOTIC DAILY PO) Take 1 capsule by mouth every evening.    Historical Provider, MD  promethazine (PHENERGAN) 12.5 MG tablet Take 12.5 mg by mouth every 6 (six) hours as needed for nausea.    Historical Provider, MD   Meds Ordered and Administered this Visit   Medications  ipratropium-albuterol (DUONEB) 0.5-2.5 (3) MG/3ML nebulizer solution 3 mL (3 mLs Nebulization Given 06/03/15 1945)  albuterol (PROVENTIL HFA;VENTOLIN HFA) 108 (90 BASE) MCG/ACT inhaler 2 puff (2 puffs Inhalation Given 06/03/15 1945)  dexamethasone (DECADRON) injection 10 mg (10 mg Intramuscular Given 06/03/15 1945)    BP 150/69 mmHg  Pulse 88  Temp(Src) 98.2 F (36.8 C) (Oral)  SpO2 95% No data found.   Physical Exam  Constitutional: She is oriented to person, place, and time. She appears well-developed and well-nourished. No  distress.  Neck: Normal range of motion. Neck supple.  Cardiovascular: Normal rate.   Pulmonary/Chest:  Mildly increased effort. Poor air movement. Inspiratory and expiratory wheezes. Prolonged expiratory phase.  Neurological: She is alert and oriented to person, place, and time. She exhibits normal muscle tone.  Skin: Skin is warm and dry.  Psychiatric: She has a normal mood and affect.  Nursing note and vitals reviewed.   ED Course  Procedures (including critical care time)  Labs Review Labs Reviewed - No data to display  Imaging Review No results found.   Visual Acuity Review  Right Eye Distance:   Left Eye Distance:   Bilateral Distance:    Right Eye Near:   Left Eye Near:    Bilateral Near:         MDM   1. Asthma exacerbation attacks, moderate persistent    After one DuoNeb 5/2.5 mg the patient states she is breathing better but not 100% better. she was also administered Decadron 10 mg IM. There is significant improvement in air movement and decrease in wheezing. The patient was given a choice to receive additional albuterol neb here and if not better or go to emergency department or she could go all and self administering another one and start a course of prednisone. The patient chose to go home and utilize another albuterol inhaler. She is given a prescription for prednisone lower dose and shorter course since she has had problems with avascular necrosis of the hips in the past secondary, in part, to corticosteroid-induced usage. Patient is stable and improved. She will follow-up with her pulmonologist next Friday as scheduled. If worse or to the emergency department.    Janne Napoleon, NP 06/03/15 2009

## 2015-06-03 NOTE — Discharge Instructions (Signed)
Asthma, Acute Bronchospasm °Acute bronchospasm caused by asthma is also referred to as an asthma attack. Bronchospasm means your air passages become narrowed. The narrowing is caused by inflammation and tightening of the muscles in the air tubes (bronchi) in your lungs. This can make it hard to breathe or cause you to wheeze and cough. °CAUSES °Possible triggers are: °· Animal dander from the skin, hair, or feathers of animals. °· Dust mites contained in house dust. °· Cockroaches. °· Pollen from trees or grass. °· Mold. °· Cigarette or tobacco smoke. °· Air pollutants such as dust, household cleaners, hair sprays, aerosol sprays, paint fumes, strong chemicals, or strong odors. °· Cold air or weather changes. Cold air may trigger inflammation. Winds increase molds and pollens in the air. °· Strong emotions such as crying or laughing hard. °· Stress. °· Certain medicines such as aspirin or beta-blockers. °· Sulfites in foods and drinks, such as dried fruits and wine. °· Infections or inflammatory conditions, such as a flu, cold, or inflammation of the nasal membranes (rhinitis). °· Gastroesophageal reflux disease (GERD). GERD is a condition where stomach acid backs up into your esophagus. °· Exercise or strenuous activity. °SIGNS AND SYMPTOMS  °· Wheezing. °· Excessive coughing, particularly at night. °· Chest tightness. °· Shortness of breath. °DIAGNOSIS  °Your health care provider will ask you about your medical history and perform a physical exam. A chest X-ray or blood testing may be performed to look for other causes of your symptoms or other conditions that may have triggered your asthma attack.  °TREATMENT  °Treatment is aimed at reducing inflammation and opening up the airways in your lungs.  Most asthma attacks are treated with inhaled medicines. These include quick relief or rescue medicines (such as bronchodilators) and controller medicines (such as inhaled corticosteroids). These medicines are sometimes  given through an inhaler or a nebulizer. Systemic steroid medicine taken by mouth or given through an IV tube also can be used to reduce the inflammation when an attack is moderate or severe. Antibiotic medicines are only used if a bacterial infection is present.  °HOME CARE INSTRUCTIONS  °· Rest. °· Drink plenty of liquids. This helps the mucus to remain thin and be easily coughed up. Only use caffeine in moderation and do not use alcohol until you have recovered from your illness. °· Do not smoke. Avoid being exposed to secondhand smoke. °· You play a critical role in keeping yourself in good health. Avoid exposure to things that cause you to wheeze or to have breathing problems. °· Keep your medicines up-to-date and available. Carefully follow your health care provider's treatment plan. °· Take your medicine exactly as prescribed. °· When pollen or pollution is bad, keep windows closed and use an air conditioner or go to places with air conditioning. °· Asthma requires careful medical care. See your health care provider for a follow-up as advised. If you are more than [redacted] weeks pregnant and you were prescribed any new medicines, let your obstetrician know about the visit and how you are doing. Follow up with your health care provider as directed. °· After you have recovered from your asthma attack, make an appointment with your outpatient doctor to talk about ways to reduce the likelihood of future attacks. If you do not have a doctor who manages your asthma, make an appointment with a primary care doctor to discuss your asthma. °SEEK IMMEDIATE MEDICAL CARE IF:  °· You are getting worse. °· You have trouble breathing. If severe, call your local   emergency services (911 in the [place]  You develop chest pain or discomfort.  You are vomiting.  You are not able to keep fluids down.  You are coughing up yellow, green, brown, or bloody sputum.  You have a fever and your symptoms suddenly get worse.  You have  trouble swallowing. MAKE SURE YOU:   Understand these instructions.  Will watch your condition.  Will get help right away if you are not doing well or get worse.   This information is not intended to replace advice given to you by your health care provider. Make sure you discuss any questions you have with your health care provider.   Document Released: 10/02/2006 Document Revised: 06/22/2013 Document Reviewed: 12/23/2012 Elsevier Interactive Patient Education 2016 Else[employer] Inc[employer]  Asthma, Adult Asthma is a recurring condition in which the airways tighten and narrow. Asthma can make it difficult to breathe. It can cause coughing, wheezing, and shortness of breath. Asthma episodes, also called asthma attacks, range from minor to life-threatening. Asthma cannot be cured, but medicines and lifestyle changes can help control it. CAUSES Asthma is believed to be caused by inherited (genetic) and environmental factors, but its exact cause is unknown. Asthma may be triggered by allergens, lung infections, or irritants in the air. Asthma triggers are different for each person. Common triggers include:   Animal dander.  Dust mites.  Cockroaches.  Pollen from trees or grass.  Mold.  Smoke.  Air pollutants such as dust, household cleaners, hair sprays, aerosol sprays, paint fumes, strong chemicals, or strong odors.  Cold air, weather changes, and winds (which increase molds and pollens in the air).  Strong emotional expressions such as crying or laughing hard.  Stress.  Certain medicines (such as aspirin) or types of drugs (such as beta-blockers).  Sulfites in foods and drinks. Foods and drinks that may contain sulfites include dried fruit, potato chips, and sparkling grape juice.  Infections or inflammatory conditions such as the flu, a cold, or an inflammation of the nasal membranes (rhinitis).  Gastroesophageal reflux disease (GERD).  Exercise or strenuous  activity. SYMPTOMS Symptoms may occur immediately after asthma is triggered or many hours later. Symptoms include:  Wheezing.  Excessive nighttime or early morning coughing.  Frequent or severe coughing with a common cold.  Chest tightness.  Shortness of breath. DIAGNOSIS  The diagnosis of asthma is made by a review of your medical history and a physical exam. Tests may also be performed. These may include:  Lung function studies. These tests show how much air you breathe in and out.  Allergy tests.  Imaging tests such as X-rays. TREATMENT  Asthma cannot be cured, but it can usually be controlled. Treatment involves identifying and avoiding your asthma triggers. It also involves medicines. There are 2 classes of medicine used for asthma treatment:   Controller medicines. These prevent asthma symptoms from occurring. They are usually taken every day.  Reliever or rescue medicines. These quickly relieve asthma symptoms. They are used as needed and provide short-term relief. Your health care provider will help you create an asthma action plan. An asthma action plan is a written plan for managing and treating your asthma attacks. It includes a list of your asthma triggers and how they may be avoided. It also includes information on when medicines should be taken and when their dosage should be changed. An action plan may also involve the use of a device called a peak flow meter. A peak flow meter measures how well the  lungs are working. It helps you monitor your condition. HOME CARE INSTRUCTIONS   Take medicines only as directed by your health care provider. Speak with your health care provider if you have questions about how or when to take the medicines.  Use a peak flow meter as directed by your health care provider. Record and keep track of readings.  Understand and use the action plan to help minimize or stop an asthma attack without needing to seek medical care.  Control your  home environment in the following ways to help prevent asthma attacks:  Do not smoke. Avoid being exposed to secondhand smoke.  Change your heating and air conditioning filter regularly.  Limit your use of fireplaces and wood stoves.  Get rid of pests (such as roaches and mice) and their droppings.  Throw away plants if you see mold on them.  Clean your floors and dust regularly. Use unscented cleaning products.  Try to have someone else vacuum for you regularly. Stay out of rooms while they are being vacuumed and for a short while afterward. If you vacuum, use a dust mask from a hardware store, a double-layered or microfilter vacuum cleaner bag, or a vacuum cleaner with a HEPA filter.  Replace carpet with wood, tile, or vinyl flooring. Carpet can trap dander and dust.  Use allergy-proof pillows, mattress covers, and box spring covers.  Wash bed sheets and blankets every week in hot water and dry them in a dryer.  Use blankets that are made of polyester or cotton.  Clean bathrooms and kitchens with bleach. If possible, have someone repaint the walls in these rooms with mold-resistant paint. Keep out of the rooms that are being cleaned and painted.  Wash hands frequently. SEEK MEDICAL CARE IF:   You have wheezing, shortness of breath, or a cough even if taking medicine to prevent attacks.  The colored mucus you cough up (sputum) is thicker than usual.  Your sputum changes from clear or white to yellow, green, gray, or bloody.  You have any problems that may be related to the medicines you are taking (such as a rash, itching, swelling, or trouble breathing).  You are using a reliever medicine more than 2-3 times per week.  Your peak flow is still at 50-79% of your personal best after following your action plan for 1 hour.  You have a fever. SEEK IMMEDIATE MEDICAL CARE IF:   You seem to be getting worse and are unresponsive to treatment during an asthma attack.  You are  short of breath even at rest.  You get short of breath when doing very little physical activity.  You have difficulty eating, drinking, or talking due to asthma symptoms.  You develop chest pain.  You develop a fast heartbeat.  You have a bluish color to your lips or fingernails.  You are light-headed, dizzy, or faint.  Your peak flow is less than 50% of your personal best.   This information is not intended to replace advice given to you by your health care provider. Make sure you discuss any questions you have with your health care provider.   Document Released: 06/17/2005 Document Revised: 03/08/2015 Document Reviewed: 01/14/2013 Elsevier Interactive Patient Education [employer].

## 2015-06-09 ENCOUNTER — Ambulatory Visit (INDEPENDENT_AMBULATORY_CARE_PROVIDER_SITE_OTHER)
Admission: RE | Admit: 2015-06-09 | Discharge: 2015-06-09 | Disposition: A | Discharge status: Home / Self Care | Payer: Commercial Managed Care - HMO | Source: Ambulatory Visit | Attending: Adult Health | Admitting: Adult Health

## 2015-06-09 ENCOUNTER — Encounter: Payer: Self-pay | Admitting: Adult Health

## 2015-06-09 ENCOUNTER — Ambulatory Visit (INDEPENDENT_AMBULATORY_CARE_PROVIDER_SITE_OTHER): Payer: Commercial Managed Care - HMO | Admitting: Adult Health

## 2015-06-09 VITALS — BP 116/76 | HR 103 | Temp 98.3°F | Ht 65.0 in | Wt 190.0 lb

## 2015-06-09 DIAGNOSIS — J4541 Moderate persistent asthma with (acute) exacerbation: Secondary | ICD-10-CM | POA: Diagnosis not present

## 2015-06-09 MED ORDER — FIRST-DUKES MOUTHWASH MT SUSP: 5.0000 mL | Freq: Three times a day (TID) | OROMUCOSAL | Status: DC | PRN

## 2015-06-09 MED ORDER — LEVALBUTEROL HCL 0.63 MG/3ML IN NEBU
0.6300 mg | INHALATION_SOLUTION | Freq: Once | RESPIRATORY_TRACT | Status: AC
  Administered 2015-06-09: 0.63 mg via RESPIRATORY_TRACT

## 2015-06-09 MED ORDER — AZITHROMYCIN 250 MG PO TABS: ORAL_TABLET | ORAL | Status: AC

## 2015-06-09 MED ORDER — BUDESONIDE-FORMOTEROL FUMARATE 80-4.5 MCG/ACT IN AERO: 2.0000 | INHALATION_SPRAY | Freq: Two times a day (BID) | RESPIRATORY_TRACT | Status: DC

## 2015-06-09 NOTE — Patient Instructions (Addendum)
Remain off Advair .  Begin Symbicort 2 puffs Twice daily  , rinse well off this  Use Majic mouthwash As needed   Zpack take as directed.  Mucinex DM Twice daily  As needed  Cough/congestion  Please contact office for sooner follow up if symptoms do not improve or worsen or seek emergency care  follow up Dr. Chase Caller in 3 months and As needed

## 2015-06-09 NOTE — Addendum Note (Signed)
Addended by: Osa Craver on: 06/09/2015 05:00 PM   Modules accepted: Orders

## 2015-06-09 NOTE — Progress Notes (Signed)
   Subjective:    Patient ID: Yolanda Yang, female    DOB: Jan 13, 1956, 59 y.o.   MRN: [id number]  HPI 59 yo female with Asthma former smoker    06/09/2015 Acute OV  Pt present for an acute office visit. Pt c/o increased SOB and wheezing. Chest congestion/tightness, occasional prod cough with yellow mucus, and sinus congestion. Denies any fever, nausea or vomiting.  She was seen in ER 1 week ago, tx w/ prednisone . She did get some better but still has congestion that is yellow. Requests refill of majic mouthwash as advair gave her thrush and her mouth is sore.  She stopped Advair.  We discussed changing to new inhaler.  She denies fever, hemoptyissi , chest pain, orthopnea. Or edema.      Review of Systems Constitutional:   No  weight loss, night sweats,  Fevers, chills, fatigue, or  lassitude.  HEENT:   No headaches,  Difficulty swallowing,  Tooth/dental problems, or  Sore throat,                No sneezing, itching, ear ache,  +nasal congestion, post nasal drip,   CV:  No chest pain,  Orthopnea, PND, swelling in lower extremities, anasarca, dizziness, palpitations, syncope.   GI  No heartburn, indigestion, abdominal pain, nausea, vomiting, diarrhea, change in bowel habits, loss of appetite, bloody stools.   Resp:  No chest wall deformity  Skin: no rash or lesions.  GU: no dysuria, change in color of urine, no urgency or frequency.  No flank pain, no hematuria   MS:  No joint pain or swelling.  No decreased range of motion.  No back pain.  Psych:  No change in mood or affect. No depression or anxiety.  No memory loss.         Objective:   Physical Exam GEN: A/Ox3; pleasant , NAD, well nourished   HEENT:  [place]/AT,  EACs-clear, TMs-wnl, NOSE-clear, THROAT-clear, no lesions, no postnasal drip or exudate noted. No thrush noted  NECK:  Supple w/ fair ROM; no JVD; normal carotid impulses w/o bruits; no thyromegaly or nodules palpated; no lymphadenopathy.no stridor    RESP  Clear  P & A; w/o, wheezes/ rales/ or rhonchi.no accessory muscle use, no dullness to percussion  CARD:  RRR, no m/r/g  , no peripheral edema, pulses intact, no cyanosis or clubbing.  GI:   Soft & nt; nml bowel sounds; no organomegaly or masses detected.  Musco: Warm bil, no deformities or joint swelling noted.   Neuro: alert, no focal deficits noted.    Skin: Warm, no lesions or rashes         Assessment & Plan:

## 2015-06-09 NOTE — Assessment & Plan Note (Signed)
Slow to resolve flare with URI  xopenex neb x 1   Plan  Remain off Advair .  Begin Symbicort 2 puffs Twice daily  , rinse well off this  Use Majic mouthwash As needed   Zpack take as directed.  Mucinex DM Twice daily  As needed  Cough/congestion  Please contact office for sooner follow up if symptoms do not improve or worsen or seek emergency care  follow up Dr. Chase Caller in 3 months and As needed

## 2015-06-09 NOTE — Addendum Note (Signed)
Addended by: Osa Craver on: 06/09/2015 04:56 PM   Modules accepted: Orders

## 2015-06-20 ENCOUNTER — Ambulatory Visit: Payer: Commercial Managed Care - HMO

## 2015-07-14 ENCOUNTER — Other Ambulatory Visit: Payer: Self-pay | Admitting: Internal Medicine

## 2015-07-17 ENCOUNTER — Ambulatory Visit
Admission: RE | Admit: 2015-07-17 | Discharge: 2015-07-17 | Disposition: A | Discharge status: Home / Self Care | Payer: Commercial Managed Care - HMO | Source: Ambulatory Visit

## 2015-07-17 DIAGNOSIS — Z1231 Encounter for screening mammogram for malignant neoplasm of breast: Secondary | ICD-10-CM

## 2015-07-18 ENCOUNTER — Other Ambulatory Visit: Payer: Self-pay | Admitting: Obstetrics and Gynecology

## 2015-07-18 DIAGNOSIS — Z9889 Other specified postprocedural states: Secondary | ICD-10-CM

## 2015-07-18 DIAGNOSIS — N6091 Unspecified benign mammary dysplasia of right breast: Secondary | ICD-10-CM

## 2015-07-18 DIAGNOSIS — R921 Mammographic calcification found on diagnostic imaging of breast: Secondary | ICD-10-CM

## 2015-07-20 DIAGNOSIS — M961 Postlaminectomy syndrome, not elsewhere classified: Secondary | ICD-10-CM | POA: Diagnosis not present

## 2015-07-20 DIAGNOSIS — M21371 Foot drop, right foot: Secondary | ICD-10-CM | POA: Diagnosis not present

## 2015-07-20 DIAGNOSIS — G8929 Other chronic pain: Secondary | ICD-10-CM | POA: Diagnosis not present

## 2015-07-20 DIAGNOSIS — Z79891 Long term (current) use of opiate analgesic: Secondary | ICD-10-CM | POA: Diagnosis not present

## 2015-07-20 DIAGNOSIS — M5136 Other intervertebral disc degeneration, lumbar region: Secondary | ICD-10-CM | POA: Diagnosis not present

## 2015-07-24 ENCOUNTER — Telehealth: Payer: Self-pay | Admitting: Adult Health

## 2015-07-24 DIAGNOSIS — R05 Cough: Secondary | ICD-10-CM | POA: Diagnosis not present

## 2015-07-24 NOTE — Telephone Encounter (Signed)
Spoke with pt. States that she would like the results from her CXR on 06/09/15. The images are in Epic but there isn't a report.  TP - please advise. Thanks.

## 2015-07-24 NOTE — Telephone Encounter (Signed)
Sorry about that  cxr was clear  Hope she is feeling better Please contact office for sooner follow up if symptoms do not improve or worsen or seek emergency care

## 2015-07-24 NOTE — Telephone Encounter (Signed)
I spoke with patient about results and she verbalized understanding and had no questions 

## 2015-07-31 ENCOUNTER — Telehealth: Payer: Self-pay | Admitting: Adult Health

## 2015-07-31 NOTE — Telephone Encounter (Signed)
lmtcb x1 for pt. 

## 2015-08-01 ENCOUNTER — Telehealth: Payer: Self-pay | Admitting: Adult Health

## 2015-08-01 NOTE — Telephone Encounter (Signed)
Spoke with pt, states TP started her on Symbicort at last ov, which has not helped her breathing.  Pt had leftover Spiriva Handihaler which she started using, which is working well for pt.  Pt requesting a refill on spiriva hh to be sent to the pharmacy- a 30 day supply to [facility] on [address] for 1 month, and a 90 day rx to be sent through [facility] mail order pharmacy (requesting the 30 day rx to local pharmacy to not interrupt treatment).  MR please advise if you're ok with this med change.  Thanks!

## 2015-08-01 NOTE — Telephone Encounter (Signed)
She has dx of asthma. While I am ok with her taking spiriva will be very uncomfortable if she does not have a ICS. She can stoop symbicort but she needs tot take Flovent 136mcg 2 puif bid / QVAR 58mcg 2 puff bid or Pulmicort 2 puff bid  AND spiriva once daily

## 2015-08-02 ENCOUNTER — Telehealth: Payer: Self-pay | Admitting: Internal Medicine

## 2015-08-02 MED ORDER — FLUTICASONE PROPIONATE HFA 110 MCG/ACT IN AERO: 2.0000 | INHALATION_SPRAY | Freq: Two times a day (BID) | RESPIRATORY_TRACT | Status: DC

## 2015-08-02 MED ORDER — BECLOMETHASONE DIPROPIONATE 80 MCG/ACT IN AERS: 2.0000 | INHALATION_SPRAY | Freq: Two times a day (BID) | RESPIRATORY_TRACT | Status: DC

## 2015-08-02 NOTE — Telephone Encounter (Signed)
Called and spoke to pt. Pt c/o thick white patches on tongue, burning discomfort on tongue x 3 months. Pt stated she was given Clotrimazole for a yeast infections by her GYN and has been taking x 2 weeks, pt was hoping this would also help with her thrush but there has been no improvement. Pt is thoroughly rinsing her mouth out after each use of her inhalers. Pt is requesting recs. Pt stated she is fine waiting until tomorrow 2.2.17, no current distress at this time.   Dr. Chase Caller please advise. Thanks.

## 2015-08-02 NOTE — Telephone Encounter (Signed)
Old msg  See msg from 08/01/15

## 2015-08-02 NOTE — Telephone Encounter (Signed)
Not sure if this is thrush. NOt sure if this is MDI related. sHe needs an exam. Give her fu to see TP or SG or she see her pcp Maggie Font, MD

## 2015-08-02 NOTE — Telephone Encounter (Signed)
Spoke with pt. She is aware of MR's response. Rxs for Flovent and Qvar have been sent in. Nothing further was needed.

## 2015-08-03 ENCOUNTER — Telehealth: Payer: Self-pay | Admitting: Internal Medicine

## 2015-08-03 NOTE — Telephone Encounter (Signed)
Received fax from [place] requesting clarification because both Flovent and Qvar were sent to pharmacy. Called and spoke to pt and informed her only one inhaler (plus the Spiriva) should be used. Pt requested Qvar be sent in. [place] stating only Qvar should be used in conjunction with Spiriva.   Will forward to MR as FYI.

## 2015-08-03 NOTE — Telephone Encounter (Signed)
Spoke with patient, she said that she will call back to schedule appointment.  She said that she has to take her husband to [facility] tomorrow and she has other plans for today, so she will call our office or her PCP to schedule an appointment for next week.  Nothing further needed.

## 2015-08-04 ENCOUNTER — Other Ambulatory Visit: Payer: Self-pay | Admitting: Internal Medicine

## 2015-08-07 ENCOUNTER — Telehealth: Payer: Self-pay | Admitting: Internal Medicine

## 2015-08-07 MED ORDER — TIOTROPIUM BROMIDE MONOHYDRATE 18 MCG IN CAPS: 18.0000 ug | ORAL_CAPSULE | Freq: Every day | RESPIRATORY_TRACT | Status: DC

## 2015-08-07 MED ORDER — ALBUTEROL SULFATE (2.5 MG/3ML) 0.083% IN NEBU: INHALATION_SOLUTION | RESPIRATORY_TRACT | Status: DC

## 2015-08-07 NOTE — Telephone Encounter (Signed)
Called spoke with pt. She reports she needs her spiriva and albuterol neb sent to [place]. I have done so. Nothing further needed

## 2015-08-07 NOTE — Telephone Encounter (Signed)
Pt calling stating that the pharmacy is saying that they don't have a script for the spiriva, please advise.Yolanda Yang

## 2015-08-07 NOTE — Telephone Encounter (Signed)
Duplicate message. 

## 2015-08-09 ENCOUNTER — Other Ambulatory Visit: Payer: Commercial Managed Care - HMO

## 2015-08-09 ENCOUNTER — Telehealth: Payer: Self-pay | Admitting: Internal Medicine

## 2015-08-09 NOTE — Telephone Encounter (Signed)
Pt is not on flovent. See previous message. Pt uses QVAR. Called and spoke with [place] with [facility]. I made her aware pt is too be on QVAR. She verbalized understanding and needed nothing further

## 2015-08-10 ENCOUNTER — Ambulatory Visit
Admission: RE | Admit: 2015-08-10 | Discharge: 2015-08-10 | Disposition: A | Discharge status: Home / Self Care | Payer: Commercial Managed Care - HMO | Source: Ambulatory Visit | Attending: Obstetrics and Gynecology | Admitting: Obstetrics and Gynecology

## 2015-08-10 DIAGNOSIS — Z9889 Other specified postprocedural states: Secondary | ICD-10-CM

## 2015-08-10 DIAGNOSIS — N6091 Unspecified benign mammary dysplasia of right breast: Secondary | ICD-10-CM

## 2015-08-10 DIAGNOSIS — R928 Other abnormal and inconclusive findings on diagnostic imaging of breast: Secondary | ICD-10-CM | POA: Diagnosis not present

## 2015-08-15 ENCOUNTER — Emergency Department (INDEPENDENT_AMBULATORY_CARE_PROVIDER_SITE_OTHER)
Admission: EM | Admit: 2015-08-15 | Discharge: 2015-08-15 | Disposition: A | Discharge status: Home / Self Care | Payer: Commercial Managed Care - HMO | Source: Home / Self Care | Attending: Emergency Medicine | Admitting: Emergency Medicine

## 2015-08-15 ENCOUNTER — Encounter (HOSPITAL_COMMUNITY): Payer: Self-pay | Admitting: Emergency Medicine

## 2015-08-15 DIAGNOSIS — J018 Other acute sinusitis: Secondary | ICD-10-CM

## 2015-08-15 DIAGNOSIS — R490 Dysphonia: Secondary | ICD-10-CM | POA: Diagnosis not present

## 2015-08-15 MED ORDER — AZITHROMYCIN 250 MG PO TABS: ORAL_TABLET | ORAL | Status: DC

## 2015-08-15 MED ORDER — RANITIDINE HCL 150 MG PO CAPS: 150.0000 mg | ORAL_CAPSULE | Freq: Two times a day (BID) | ORAL | Status: DC

## 2015-08-15 MED ORDER — SUCRALFATE 1 GM/10ML PO SUSP: 1.0000 g | Freq: Three times a day (TID) | ORAL | Status: DC

## 2015-08-15 NOTE — Discharge Instructions (Signed)
I am concerned for a sinus infection and uncontrolled stomach ulcer. Please take azithromycin as prescribed. This will treat a sinus infection. Continue your Protonix. Please add ranitidine twice a day and sucralfate 4 times a day. Follow-up with your primary care doctor in 1-2 weeks to see how things are going.

## 2015-08-15 NOTE — ED Provider Notes (Signed)
CSN: [id number]     Arrival date & time 08/15/15  1732 History   First MD Initiated Contact with Patient 08/15/15 1832     Chief Complaint  Patient presents with  . Sore Throat  . URI   (Consider location/radiation/quality/duration/timing/severity/associated sxs/prior Treatment) HPI  She is a 60 year old woman here for evaluation of hoarseness. She states for the last 3 weeks she has not felt well with hoarseness and intermittent ear aches. She reports nasal congestion and some mild headaches. No fevers or cough. She does state her stomach ulcers have been worse recently, despite taking Protonix. She denies frank blood in her stool, but has seen some intermittent dark stools.  She has tried taking Avelox and prednisone that she had at home without improvement.  Past Medical History  Diagnosis Date  . Hyperlipemia   . GERD (gastroesophageal reflux disease)   . IBS (irritable bowel syndrome)   . Cystitis, interstitial   . Anxiety   . History of cerebral aneurysm repair     MULTIPLE  ANEURYSM--  S/P  COILING X4  ([facility])  . Borderline type 2 diabetes mellitus     DIET CONTROLLED  . History of chronic sinusitis   . Seasonal allergic rhinitis   . Asthma exacerbation     PULMOLOGIST-- DR RAMASWANY  . History of gastric ulcer   . History of duodenal ulcer     2007  . History of GI bleed     UPPER--  2007  AND 05-2011  . Chronic constipation   . Right foot drop     POST LUMBAR SURGERY 2011  . Cerebral aneurysm, nonruptured monitored at [facility] by dr Anne Shutter    currently has x4 stable aneurysm's (x2 left side & x2 right side)--  2012 s/p coiling of 4 aneurysm's  . Bladder pain   . Incomplete emptying of bladder   . Urinary hesitancy   . History of recurrent UTIs   . Headache syndrome     due to cerebral aneurysm's  . Wears glasses   . OA (osteoarthritis)   . Wears dentures     currently being fitted for full dentures   Past Surgical History  Procedure Laterality Date   . Esophagogastroduodenoscopy  05/17/2011    Procedure: ESOPHAGOGASTRODUODENOSCOPY (EGD);  Surgeon: Beryle Beams;  Location: [facility] ENDOSCOPY;  Service: Endoscopy;  Laterality: N/A;  . Breast reduction surgery Bilateral 06/09/2014    Procedure: BILATERAL MAMMARY REDUCTION  (BREAST);  Surgeon: Irene Limbo, MD;  Location: [place];  Service: Plastics;  Laterality: Bilateral;  . Posterior laminectomy / decompression lumbar spine  05-17-2010    L3 -- L5  . Left breast lumpectomy  2000    benign  . Anterior cervical decomp/discectomy fusion  2002    C4 -- C7  . Rotator cuff repair Right 2014  . Aneurysm coiling  05-08-2011     [facility]    INTRACRANIAL COILING X4  and Right Carotid stenting due to intraoperative injury  . Cysto/  hydrodistention/  instillation therapy  07-15-2006  . Tonsillectomy  age 37  . Abdominal hysterectomy  1997  . Total hip arthroplasty Bilateral may & july 2013  . Posterior fusion cervical spine  2004    Revision C4 -- C7  . Negative sleep study  2011  PER PT  . Cystoscopy with hydrodistension and biopsy N/A 09/08/2014    Procedure: CYSTOSCOPY/ HYDRODISTENSION OF BLADDER/ INSTILLATION OF MARCAINE AND PYRIDIUM;  Surgeon: Bjorn Loser, MD;  Location: [place];  Service: Urology;  Laterality: N/A;   Family History  Problem Relation Age of Onset  . Colon cancer Father   . Leukemia Father    Social History  Substance Use Topics  . Smoking status: Former Smoker -- 0.50 packs/day for 20 years    Types: Cigarettes    Quit date: 08/02/2011  . Smokeless tobacco: Never Used  . Alcohol Use: No   OB History    No data available     Review of Systems As in history of present illness Allergies  Erythromycin and Latex  Home Medications   Prior to Admission medications   Medication Sig Start Date End Date Taking? Authorizing Provider  acetaminophen (TYLENOL) 650 MG CR tablet Take 1,300 mg by mouth every 8 (eight) hours as needed for pain.     Historical Provider, MD  albuterol (PROVENTIL) (2.5 MG/3ML) 0.083% nebulizer solution INHALE THE CONTENTS OF 1 VIAL IN NEBULIZER EVERY 12 HOURS AS NEEDED 08/07/15   Brand Males, MD  aspirin 81 MG chewable tablet Chew 81 mg by mouth daily.     Historical Provider, MD  azithromycin (ZITHROMAX Z-PAK) 250 MG tablet Take 2 pills today, then 1 pill daily until gone. 08/15/15   Melony Overly, MD  beclomethasone (QVAR) 80 MCG/ACT inhaler Inhale 2 puffs into the lungs 2 (two) times daily. 08/02/15   Brand Males, MD  butalbital-acetaminophen-caffeine (FIORICET, ESGIC) 50-325-40 MG per tablet Take 1 tablet by mouth daily as needed for headache.     Historical Provider, MD  clindamycin (CLEOCIN) 300 MG capsule Take 300 mg by mouth daily.     Historical Provider, MD  cyclobenzaprine (FLEXERIL) 10 MG tablet Take 10 mg by mouth 3 (three) times daily as needed for muscle spasms.     Historical Provider, MD  diazepam (VALIUM) 5 MG tablet Take 5 mg by mouth every 6 (six) hours as needed for anxiety.    Historical Provider, MD  Diphenhyd-Hydrocort-Nystatin (FIRST-DUKES MOUTHWASH) SUSP Use as directed 5 mLs in the mouth or throat 3 (three) times daily as needed. 06/09/15   Tammy S Parrett, NP  DULoxetine (CYMBALTA) 60 MG capsule Take 60 mg by mouth every evening.     Historical Provider, MD  fexofenadine (ALLEGRA) 180 MG tablet Take 180 mg by mouth daily as needed for allergies or rhinitis.    Historical Provider, MD  gabapentin (NEURONTIN) 600 MG tablet Take 600 mg by mouth 3 (three) times daily.    Historical Provider, MD  guaiFENesin (MUCINEX) 600 MG 12 hr tablet Take by mouth 2 (two) times daily as needed for to loosen phlegm.    Historical Provider, MD  HYDROmorphone (DILAUDID) 4 MG tablet Take 1-1.5 tablets (4-6 mg total) by mouth every 4 (four) hours as needed for severe pain. 06/10/14   Irene Limbo, MD  Linaclotide (LINZESS) 290 MCG CAPS capsule Take 290 mcg by mouth every evening.     Historical  Provider, MD  Meth-Hyo-M Bl-Na Phos-Ph Sal (URIBEL) 118 MG CAPS Take 2 capsules by mouth 2 (two) times daily as needed (interstitial cystitis).    Historical Provider, MD  nitrofurantoin, macrocrystal-monohydrate, (MACROBID) 100 MG capsule Take 100 mg by mouth daily.    Historical Provider, MD  nystatin cream (MYCOSTATIN) Apply 1 application topically at bedtime. PT USES PRN QHS WHEN ON ANTIBIOTICS TO PREVENT YEAST INFECTION    Historical Provider, MD  pantoprazole (PROTONIX) 40 MG tablet Take 1 tablet (40 mg total) by mouth 2 (two) times daily before a meal. 05/20/11  Hosie Poisson, MD  pentosan polysulfate (ELMIRON) 100 MG capsule Take 100 mg by mouth 2 (two) times daily.     Historical Provider, MD  Probiotic Product (PROBIOTIC DAILY PO) Take 1 capsule by mouth every evening.    Historical Provider, MD  promethazine (PHENERGAN) 12.5 MG tablet Take 12.5 mg by mouth every 6 (six) hours as needed for nausea.    Historical Provider, MD  ranitidine (ZANTAC) 150 MG capsule Take 1 capsule (150 mg total) by mouth 2 (two) times daily. 08/15/15   Melony Overly, MD  rosuvastatin (CRESTOR) 10 MG tablet Take 10 mg by mouth every evening.     Historical Provider, MD  sucralfate (CARAFATE) 1 GM/10ML suspension Take 10 mLs (1 g total) by mouth 4 (four) times daily -  with meals and at bedtime. 08/15/15   Melony Overly, MD  tiotropium (SPIRIVA) 18 MCG inhalation capsule Place 1 capsule (18 mcg total) into inhaler and inhale daily. 08/07/15   Brand Males, MD  VENTOLIN HFA 108 (90 Base) MCG/ACT inhaler INHALE 2 PUFFS INTO THE LUNGS EVERY 4 (FOUR) HOURS AS NEEDED FOR WHEEZING OR SHORTNESS OF BREATH. 07/14/15   Brand Males, MD   Meds Ordered and Administered this Visit  Medications - No data to display  BP 132/79 mmHg  Pulse 97  Temp(Src) 98.1 F (36.7 C) (Oral)  Resp 14  SpO2 97% No data found.   Physical Exam  Constitutional: She is oriented to person, place, and time. She appears well-developed and  well-nourished. No distress.  HENT:  Mouth/Throat: Oropharynx is clear and moist. No oropharyngeal exudate.  TMs mildly retracted bilaterally. Nasal mucosa slightly erythematous.  Neck: Neck supple.  Cardiovascular: Normal rate, regular rhythm and normal heart sounds.   No murmur heard. Pulmonary/Chest: Effort normal and breath sounds normal. No respiratory distress. She has no wheezes. She has no rales.  Lymphadenopathy:    She has no cervical adenopathy.  Neurological: She is alert and oriented to person, place, and time.    ED Course  Procedures (including critical care time)  Labs Review Labs Reviewed - No data to display  Imaging Review No results found.    MDM   1. Other subacute sinusitis   2. Hoarseness    She may have a subacute sinusitis. Will treat with azithromycin. I am more concerned about uncontrolled peptic ulcer disease. Will add ranitidine and sucralfate to her Protonix. Recommend follow-up with PCP in 1-2 weeks.    Melony Overly, MD 08/15/15 [phone]

## 2015-08-15 NOTE — ED Notes (Signed)
Hoarseness and ear pain for 3 weeks

## 2015-08-24 DIAGNOSIS — M961 Postlaminectomy syndrome, not elsewhere classified: Secondary | ICD-10-CM | POA: Diagnosis not present

## 2015-08-24 DIAGNOSIS — Z79891 Long term (current) use of opiate analgesic: Secondary | ICD-10-CM | POA: Diagnosis not present

## 2015-08-24 DIAGNOSIS — M5136 Other intervertebral disc degeneration, lumbar region: Secondary | ICD-10-CM | POA: Diagnosis not present

## 2015-08-24 DIAGNOSIS — M21371 Foot drop, right foot: Secondary | ICD-10-CM | POA: Diagnosis not present

## 2015-08-29 ENCOUNTER — Telehealth: Payer: Self-pay | Admitting: Internal Medicine

## 2015-08-29 MED ORDER — BECLOMETHASONE DIPROPIONATE 80 MCG/ACT IN AERS: 2.0000 | INHALATION_SPRAY | Freq: Two times a day (BID) | RESPIRATORY_TRACT | Status: DC

## 2015-08-29 NOTE — Telephone Encounter (Signed)
Spoke with pt, needs qvar refilled to [facility] order pharmacy.  This has been sent.  Nothing further needed.

## 2015-09-04 DIAGNOSIS — E7800 Pure hypercholesterolemia, unspecified: Secondary | ICD-10-CM | POA: Diagnosis not present

## 2015-09-04 DIAGNOSIS — M5406 Panniculitis affecting regions of neck and back, lumbar region: Secondary | ICD-10-CM | POA: Diagnosis not present

## 2015-09-04 DIAGNOSIS — Z7951 Long term (current) use of inhaled steroids: Secondary | ICD-10-CM | POA: Diagnosis not present

## 2015-09-04 DIAGNOSIS — J45909 Unspecified asthma, uncomplicated: Secondary | ICD-10-CM | POA: Diagnosis not present

## 2015-09-04 DIAGNOSIS — Z96641 Presence of right artificial hip joint: Secondary | ICD-10-CM | POA: Diagnosis not present

## 2015-09-04 DIAGNOSIS — M47897 Other spondylosis, lumbosacral region: Secondary | ICD-10-CM | POA: Diagnosis not present

## 2015-09-04 DIAGNOSIS — I10 Essential (primary) hypertension: Secondary | ICD-10-CM | POA: Diagnosis not present

## 2015-09-04 DIAGNOSIS — Z7982 Long term (current) use of aspirin: Secondary | ICD-10-CM | POA: Diagnosis not present

## 2015-09-04 DIAGNOSIS — M545 Low back pain: Secondary | ICD-10-CM | POA: Diagnosis not present

## 2015-09-04 DIAGNOSIS — Z87891 Personal history of nicotine dependence: Secondary | ICD-10-CM | POA: Diagnosis not present

## 2015-09-12 ENCOUNTER — Ambulatory Visit: Payer: Commercial Managed Care - HMO | Admitting: Internal Medicine

## 2015-09-19 DIAGNOSIS — G894 Chronic pain syndrome: Secondary | ICD-10-CM | POA: Diagnosis not present

## 2015-09-19 DIAGNOSIS — Z79891 Long term (current) use of opiate analgesic: Secondary | ICD-10-CM | POA: Diagnosis not present

## 2015-09-19 DIAGNOSIS — M961 Postlaminectomy syndrome, not elsewhere classified: Secondary | ICD-10-CM | POA: Diagnosis not present

## 2015-09-19 DIAGNOSIS — M5136 Other intervertebral disc degeneration, lumbar region: Secondary | ICD-10-CM | POA: Diagnosis not present

## 2015-09-19 DIAGNOSIS — M21371 Foot drop, right foot: Secondary | ICD-10-CM | POA: Diagnosis not present

## 2015-09-19 DIAGNOSIS — M4806 Spinal stenosis, lumbar region: Secondary | ICD-10-CM | POA: Diagnosis not present

## 2015-10-03 DIAGNOSIS — J3489 Other specified disorders of nose and nasal sinuses: Secondary | ICD-10-CM | POA: Diagnosis not present

## 2015-10-03 DIAGNOSIS — Z6831 Body mass index (BMI) 31.0-31.9, adult: Secondary | ICD-10-CM | POA: Diagnosis not present

## 2015-10-04 DIAGNOSIS — M21371 Foot drop, right foot: Secondary | ICD-10-CM | POA: Diagnosis not present

## 2015-10-04 DIAGNOSIS — M47816 Spondylosis without myelopathy or radiculopathy, lumbar region: Secondary | ICD-10-CM | POA: Diagnosis not present

## 2015-10-12 ENCOUNTER — Ambulatory Visit
Admission: RE | Admit: 2015-10-12 | Discharge: 2015-10-12 | Disposition: A | Discharge status: Home / Self Care | Payer: Commercial Managed Care - HMO | Source: Ambulatory Visit | Attending: Family Medicine | Admitting: Family Medicine

## 2015-10-12 ENCOUNTER — Other Ambulatory Visit: Payer: Self-pay | Admitting: Family Medicine

## 2015-10-12 DIAGNOSIS — R52 Pain, unspecified: Secondary | ICD-10-CM

## 2015-10-12 DIAGNOSIS — J3489 Other specified disorders of nose and nasal sinuses: Secondary | ICD-10-CM | POA: Diagnosis not present

## 2015-10-18 DIAGNOSIS — G5701 Lesion of sciatic nerve, right lower limb: Secondary | ICD-10-CM | POA: Diagnosis not present

## 2015-10-19 DIAGNOSIS — M961 Postlaminectomy syndrome, not elsewhere classified: Secondary | ICD-10-CM | POA: Diagnosis not present

## 2015-10-19 DIAGNOSIS — M5136 Other intervertebral disc degeneration, lumbar region: Secondary | ICD-10-CM | POA: Diagnosis not present

## 2015-10-19 DIAGNOSIS — G894 Chronic pain syndrome: Secondary | ICD-10-CM | POA: Diagnosis not present

## 2015-10-25 ENCOUNTER — Ambulatory Visit (HOSPITAL_COMMUNITY)
Admission: EM | Admit: 2015-10-25 | Discharge: 2015-10-25 | Disposition: A | Discharge status: Home / Self Care | Payer: Commercial Managed Care - HMO | Attending: Physician Assistant | Admitting: Physician Assistant

## 2015-10-25 ENCOUNTER — Ambulatory Visit (INDEPENDENT_AMBULATORY_CARE_PROVIDER_SITE_OTHER): Payer: Commercial Managed Care - HMO

## 2015-10-25 ENCOUNTER — Encounter (HOSPITAL_COMMUNITY): Payer: Self-pay | Admitting: Emergency Medicine

## 2015-10-25 DIAGNOSIS — M25511 Pain in right shoulder: Secondary | ICD-10-CM | POA: Diagnosis not present

## 2015-10-25 DIAGNOSIS — R7309 Other abnormal glucose: Secondary | ICD-10-CM | POA: Diagnosis not present

## 2015-10-25 DIAGNOSIS — E785 Hyperlipidemia, unspecified: Secondary | ICD-10-CM | POA: Diagnosis not present

## 2015-10-25 NOTE — ED Provider Notes (Signed)
CSN: [id number]     Arrival date & time 10/25/15  1657 History   None    Chief Complaint  Patient presents with  . Arm Pain   (Consider location/radiation/quality/duration/timing/severity/associated sxs/prior Treatment) HPI History obtained from patient: Location: Right shoulder  Context/Duration: Gradual onset, over the last month  Severity: 8  Quality:like previous pain in shoulder Timing: constant           Home Treatment: symptomatic treatment without relief of symptoms.  Associated symptoms:  Decreased movement  Past Medical History  Diagnosis Date  . Hyperlipemia   . GERD (gastroesophageal reflux disease)   . IBS (irritable bowel syndrome)   . Cystitis, interstitial   . Anxiety   . History of cerebral aneurysm repair     MULTIPLE  ANEURYSM--  S/P  COILING X4  ([facility])  . Borderline type 2 diabetes mellitus     DIET CONTROLLED  . History of chronic sinusitis   . Seasonal allergic rhinitis   . Asthma exacerbation     PULMOLOGIST-- DR RAMASWANY  . History of gastric ulcer   . History of duodenal ulcer     2007  . History of GI bleed     UPPER--  2007  AND 05-2011  . Chronic constipation   . Right foot drop     POST LUMBAR SURGERY 2011  . Cerebral aneurysm, nonruptured monitored at [facility] by dr Anne Shutter    currently has x4 stable aneurysm's (x2 left side & x2 right side)--  2012 s/p coiling of 4 aneurysm's  . Bladder pain   . Incomplete emptying of bladder   . Urinary hesitancy   . History of recurrent UTIs   . Headache syndrome     due to cerebral aneurysm's  . Wears glasses   . OA (osteoarthritis)   . Wears dentures     currently being fitted for full dentures   Past Surgical History  Procedure Laterality Date  . Esophagogastroduodenoscopy  05/17/2011    Procedure: ESOPHAGOGASTRODUODENOSCOPY (EGD);  Surgeon: Beryle Beams;  Location: [facility] ENDOSCOPY;  Service: Endoscopy;  Laterality: N/A;  . Breast reduction surgery Bilateral 06/09/2014   Procedure: BILATERAL MAMMARY REDUCTION  (BREAST);  Surgeon: Irene Limbo, MD;  Location: [place];  Service: Plastics;  Laterality: Bilateral;  . Posterior laminectomy / decompression lumbar spine  05-17-2010    L3 -- L5  . Left breast lumpectomy  2000    benign  . Anterior cervical decomp/discectomy fusion  2002    C4 -- C7  . Rotator cuff repair Right 2014  . Aneurysm coiling  05-08-2011     [facility]    INTRACRANIAL COILING X4  and Right Carotid stenting due to intraoperative injury  . Cysto/  hydrodistention/  instillation therapy  07-15-2006  . Tonsillectomy  age 16  . Abdominal hysterectomy  1997  . Total hip arthroplasty Bilateral may & july 2013  . Posterior fusion cervical spine  2004    Revision C4 -- C7  . Negative sleep study  2011  PER PT  . Cystoscopy with hydrodistension and biopsy N/A 09/08/2014    Procedure: CYSTOSCOPY/ HYDRODISTENSION OF BLADDER/ INSTILLATION OF MARCAINE AND PYRIDIUM;  Surgeon: Bjorn Loser, MD;  Location: [place];  Service: Urology;  Laterality: N/A;   Family History  Problem Relation Age of Onset  . Colon cancer Father   . Leukemia Father    Social History  Substance Use Topics  . Smoking status: Former Smoker -- 0.50 packs/day for 20  years    Types: Cigarettes    Quit date: 08/02/2011  . Smokeless tobacco: Never Used  . Alcohol Use: No   OB History    No data available     Review of Systems ROS +'ve right shoulder pain  Denies: HEADACHE, NAUSEA, ABDOMINAL PAIN, CHEST PAIN, CONGESTION, DYSURIA, SHORTNESS OF BREATH  Allergies  Erythromycin and Latex  Home Medications   Prior to Admission medications   Medication Sig Start Date End Date Taking? Authorizing Provider  acetaminophen (TYLENOL) 650 MG CR tablet Take 1,300 mg by mouth every 8 (eight) hours as needed for pain.    Historical Provider, MD  albuterol (PROVENTIL) (2.5 MG/3ML) 0.083% nebulizer solution INHALE THE CONTENTS OF 1 VIAL IN NEBULIZER EVERY 12  HOURS AS NEEDED 08/07/15   Brand Males, MD  aspirin 81 MG chewable tablet Chew 81 mg by mouth daily.     Historical Provider, MD  azithromycin (ZITHROMAX Z-PAK) 250 MG tablet Take 2 pills today, then 1 pill daily until gone. 08/15/15   Melony Overly, MD  beclomethasone (QVAR) 80 MCG/ACT inhaler Inhale 2 puffs into the lungs 2 (two) times daily. 08/29/15   Brand Males, MD  butalbital-acetaminophen-caffeine (FIORICET, ESGIC) 50-325-40 MG per tablet Take 1 tablet by mouth daily as needed for headache.     Historical Provider, MD  clindamycin (CLEOCIN) 300 MG capsule Take 300 mg by mouth daily.     Historical Provider, MD  cyclobenzaprine (FLEXERIL) 10 MG tablet Take 10 mg by mouth 3 (three) times daily as needed for muscle spasms.     Historical Provider, MD  diazepam (VALIUM) 5 MG tablet Take 5 mg by mouth every 6 (six) hours as needed for anxiety.    Historical Provider, MD  Diphenhyd-Hydrocort-Nystatin (FIRST-DUKES MOUTHWASH) SUSP Use as directed 5 mLs in the mouth or throat 3 (three) times daily as needed. 06/09/15   Tammy S Parrett, NP  DULoxetine (CYMBALTA) 60 MG capsule Take 60 mg by mouth every evening.     Historical Provider, MD  fexofenadine (ALLEGRA) 180 MG tablet Take 180 mg by mouth daily as needed for allergies or rhinitis.    Historical Provider, MD  gabapentin (NEURONTIN) 600 MG tablet Take 600 mg by mouth 3 (three) times daily.    Historical Provider, MD  guaiFENesin (MUCINEX) 600 MG 12 hr tablet Take by mouth 2 (two) times daily as needed for to loosen phlegm.    Historical Provider, MD  HYDROmorphone (DILAUDID) 4 MG tablet Take 1-1.5 tablets (4-6 mg total) by mouth every 4 (four) hours as needed for severe pain. 06/10/14   Irene Limbo, MD  Linaclotide (LINZESS) 290 MCG CAPS capsule Take 290 mcg by mouth every evening.     Historical Provider, MD  Meth-Hyo-M Bl-Na Phos-Ph Sal (URIBEL) 118 MG CAPS Take 2 capsules by mouth 2 (two) times daily as needed (interstitial cystitis).     Historical Provider, MD  nitrofurantoin, macrocrystal-monohydrate, (MACROBID) 100 MG capsule Take 100 mg by mouth daily.    Historical Provider, MD  nystatin cream (MYCOSTATIN) Apply 1 application topically at bedtime. PT USES PRN QHS WHEN ON ANTIBIOTICS TO PREVENT YEAST INFECTION    Historical Provider, MD  pantoprazole (PROTONIX) 40 MG tablet Take 1 tablet (40 mg total) by mouth 2 (two) times daily before a meal. 05/20/11   Hosie Poisson, MD  pentosan polysulfate (ELMIRON) 100 MG capsule Take 100 mg by mouth 2 (two) times daily.     Historical Provider, MD  Probiotic Product (PROBIOTIC DAILY PO)  Take 1 capsule by mouth every evening.    Historical Provider, MD  promethazine (PHENERGAN) 12.5 MG tablet Take 12.5 mg by mouth every 6 (six) hours as needed for nausea.    Historical Provider, MD  ranitidine (ZANTAC) 150 MG capsule Take 1 capsule (150 mg total) by mouth 2 (two) times daily. 08/15/15   Melony Overly, MD  rosuvastatin (CRESTOR) 10 MG tablet Take 10 mg by mouth every evening.     Historical Provider, MD  sucralfate (CARAFATE) 1 GM/10ML suspension Take 10 mLs (1 g total) by mouth 4 (four) times daily -  with meals and at bedtime. 08/15/15   Melony Overly, MD  tiotropium (SPIRIVA) 18 MCG inhalation capsule Place 1 capsule (18 mcg total) into inhaler and inhale daily. 08/07/15   Brand Males, MD  VENTOLIN HFA 108 (90 Base) MCG/ACT inhaler INHALE 2 PUFFS INTO THE LUNGS EVERY 4 (FOUR) HOURS AS NEEDED FOR WHEEZING OR SHORTNESS OF BREATH. 07/14/15   Brand Males, MD   Meds Ordered and Administered this Visit  Medications - No data to display  BP 127/68 mmHg  Pulse 85  Temp(Src) 98.2 F (36.8 C) (Oral)  Resp 16  SpO2 96% No data found.   Physical Exam NURSES NOTES AND VITAL SIGNS REVIEWED. CONSTITUTIONAL: Well developed, well nourished, no acute distress HEENT: normocephalic, atraumatic EYES: Conjunctiva normal NECK:normal ROM, supple, no adenopathy PULMONARY:No respiratory distress,  normal effort ABDOMINAL: Soft, ND, NT BS+, No CVAT MUSCULOSKELETAL: right shoulder: tender to palpation, rom is decreased. Sensory/motor function is intact.  SKIN: warm and dry without rash PSYCHIATRIC: Mood and affect, behavior are normal  ED Course  Procedures (including critical care time)  Labs Review Labs Reviewed - No data to display  Imaging Review Dg Shoulder Right  10/25/2015  CLINICAL DATA:  Right shoulder pain for 1 month. No definite injury. EXAM: RIGHT SHOULDER - 2+ VIEW COMPARISON:  None. FINDINGS: No fracture or dislocation is noted. Postsurgical changes are seen involving the right humeral head. Moderate degenerative changes seen involving the right acromioclavicular joint. Visualized ribs appear normal. IMPRESSION: Postsurgical and degenerative changes as described above. No acute abnormality seen in the right shoulder. Electronically Signed   By: Marijo Conception, M.D.   On: 10/25/2015 18:28     Visual Acuity Review  Right Eye Distance:   Left Eye Distance:   Bilateral Distance:    Right Eye Near:   Left Eye Near:    Bilateral Near:       I HAVE REVIEWED AND DISCUSSED RESULTS OF THE X-RAYS WITH PATIENT   Sling to help support arm  Pt states she has pain meds at home.   MDM   1. Shoulder pain, acute, right     Patient is reassured that there are no issues that require transfer to higher level of care at this time or additional tests. Patient is advised to continue home symptomatic treatment. Patient is advised that if there are new or worsening symptoms to attend the emergency department, contact primary care provider, or return to UC. Instructions of care provided discharged home in stable condition.    THIS NOTE WAS GENERATED USING A VOICE RECOGNITION SOFTWARE PROGRAM. ALL REASONABLE EFFORTS  WERE MADE TO PROOFREAD THIS DOCUMENT FOR ACCURACY.  I have verbally reviewed the discharge instructions with the patient. A printed AVS was given to the  patient.  All questions were answered prior to discharge.      Konrad Felix, [place] 10/25/15 1939

## 2015-10-25 NOTE — Discharge Instructions (Signed)
Heat Therapy Heat therapy can help ease sore, stiff, injured, and tight muscles and joints. Heat relaxes your muscles, which may help ease your pain.  RISKS AND COMPLICATIONS If you have any of the following conditions, do not use heat therapy unless your health care provider has approved:  Poor circulation.  Healing wounds or scarred skin in the area being treated.  Diabetes, heart disease, or high blood pressure.  Not being able to feel (numbness) the area being treated.  Unusual swelling of the area being treated.  Active infections.  Blood clots.  Cancer.  Inability to communicate pain. This may include young children and people who have problems with their brain function (dementia).  Pregnancy. Heat therapy should only be used on old, pre-existing, or long-lasting (chronic) injuries. Do not use heat therapy on new injuries unless directed by your health care provider. HOW TO USE HEAT THERAPY There are several different kinds of heat therapy, including:  Moist heat pack.  Warm water bath.  Hot water bottle.  Electric heating pad.  Heated gel pack.  Heated wrap.  Electric heating pad. Use the heat therapy method suggested by your health care provider. Follow your health care provider's instructions on when and how to use heat therapy. GENERAL HEAT THERAPY RECOMMENDATIONS  Do not sleep while using heat therapy. Only use heat therapy while you are awake.  Your skin may turn pink while using heat therapy. Do not use heat therapy if your skin turns red.  Do not use heat therapy if you have new pain.  High heat or long exposure to heat can cause burns. Be careful when using heat therapy to avoid burning your skin.  Do not use heat therapy on areas of your skin that are already irritated, such as with a rash or sunburn. SEEK MEDICAL CARE IF:  You have blisters, redness, swelling, or numbness.  You have new pain.  Your pain is worse. MAKE SURE  YOU:  Understand these instructions.  Will watch your condition.  Will get help right away if you are not doing well or get worse.   This information is not intended to replace advice given to you by your health care provider. Make sure you discuss any questions you have with your health care provider.   Document Released: 09/09/2011 Document Revised: 07/08/2014 Document Reviewed: 08/10/2013 Elsevier Interactive Patient Education 2016 [employer]. How to Use a Sling A sling is a type of hanging bandage that is worn around your neck to protect an injured arm, shoulder, or other body part. You may need to wear a sling to keep you from moving (immobilize) the injured body part while it heals. Keeping the injured part of your body still reduces pain and speeds up healing. Your health care provider may recommend using a sling if you have:   A broken arm.  A broken collarbone.  A shoulder injury.  Surgery. RISKS AND COMPLICATIONS Wearing a sling the wrong way can:  Make your injury worse.  Cause stiffness or numbness.  Affect blood circulation in your arm and hand. This can causetingling or numbness in your fingers or hands. HOW TO USE A SLING The way that you should use a sling depends on your injury. It is important that you follow all of your health care provider's instructions for your injury. Also follow these general guidelines:  Wear the sling so that your arm bends 90 degrees at the elbow. That is like a right angle or the shape of a  capital letter "L." The sling should also support your wrist and your hand.  Try to avoid moving your arm.  Do not lie down flat on your back while wearing a sling. Sleep in a recliner or use pillows to raise your upper body in bed.  Do not twist, raise, or move your arm in a way that could make your injury worse.  Do not lean on your arm while wearing a sling.  Do not lift anything while wearing a sling. SEEK MEDICAL CARE IF:  You have  bruising, swelling, or pain that is getting worse.  Your pain medicine is not helping.  You have a fever. SEEK IMMEDIATE MEDICAL CARE IF:  Your fingers are numb or tingling.  Your fingers turn blue or feel cold to the touch.  You cannot control the bleeding from your injury.  You are short of breath.   This information is not intended to replace advice given to you by your health care provider. Make sure you discuss any questions you have with your health care provider.   Document Released: 01/30/2004 Document Revised: 07/08/2014 Document Reviewed: 04/20/2014 Elsevier Interactive Patient Education [employer].

## 2015-10-25 NOTE — ED Notes (Signed)
Right arm pain and inability to lift arm.  Patient has had shoulder surgery in 2015.  Patient's doctors are at [facility].  Patient is a patient of pain clinic.  Finger are numb and tingling.

## 2015-11-13 DIAGNOSIS — I10 Essential (primary) hypertension: Secondary | ICD-10-CM | POA: Diagnosis not present

## 2015-11-13 DIAGNOSIS — E785 Hyperlipidemia, unspecified: Secondary | ICD-10-CM | POA: Diagnosis not present

## 2015-11-13 DIAGNOSIS — Z7951 Long term (current) use of inhaled steroids: Secondary | ICD-10-CM | POA: Diagnosis not present

## 2015-11-13 DIAGNOSIS — Z4789 Encounter for other orthopedic aftercare: Secondary | ICD-10-CM | POA: Diagnosis not present

## 2015-11-13 DIAGNOSIS — J45909 Unspecified asthma, uncomplicated: Secondary | ICD-10-CM | POA: Diagnosis not present

## 2015-11-13 DIAGNOSIS — E7800 Pure hypercholesterolemia, unspecified: Secondary | ICD-10-CM | POA: Diagnosis not present

## 2015-11-13 DIAGNOSIS — Z87891 Personal history of nicotine dependence: Secondary | ICD-10-CM | POA: Diagnosis not present

## 2015-11-13 DIAGNOSIS — M7500 Adhesive capsulitis of unspecified shoulder: Secondary | ICD-10-CM | POA: Insufficient documentation

## 2015-11-13 DIAGNOSIS — Z7982 Long term (current) use of aspirin: Secondary | ICD-10-CM | POA: Diagnosis not present

## 2015-11-13 DIAGNOSIS — M7501 Adhesive capsulitis of right shoulder: Secondary | ICD-10-CM | POA: Diagnosis not present

## 2015-11-13 DIAGNOSIS — Z79899 Other long term (current) drug therapy: Secondary | ICD-10-CM | POA: Diagnosis not present

## 2015-11-21 DIAGNOSIS — G894 Chronic pain syndrome: Secondary | ICD-10-CM | POA: Diagnosis not present

## 2015-11-21 DIAGNOSIS — M961 Postlaminectomy syndrome, not elsewhere classified: Secondary | ICD-10-CM | POA: Diagnosis not present

## 2015-11-21 DIAGNOSIS — Z79891 Long term (current) use of opiate analgesic: Secondary | ICD-10-CM | POA: Diagnosis not present

## 2015-11-21 DIAGNOSIS — G8929 Other chronic pain: Secondary | ICD-10-CM | POA: Diagnosis not present

## 2015-11-24 DIAGNOSIS — J45909 Unspecified asthma, uncomplicated: Secondary | ICD-10-CM | POA: Diagnosis not present

## 2015-11-24 DIAGNOSIS — E785 Hyperlipidemia, unspecified: Secondary | ICD-10-CM | POA: Diagnosis not present

## 2015-11-24 DIAGNOSIS — I10 Essential (primary) hypertension: Secondary | ICD-10-CM | POA: Diagnosis not present

## 2015-11-24 DIAGNOSIS — Z7982 Long term (current) use of aspirin: Secondary | ICD-10-CM | POA: Diagnosis not present

## 2015-11-24 DIAGNOSIS — Z87891 Personal history of nicotine dependence: Secondary | ICD-10-CM | POA: Diagnosis not present

## 2015-11-24 DIAGNOSIS — M791 Myalgia: Secondary | ICD-10-CM | POA: Diagnosis not present

## 2015-11-24 DIAGNOSIS — Z79891 Long term (current) use of opiate analgesic: Secondary | ICD-10-CM | POA: Diagnosis not present

## 2015-11-24 DIAGNOSIS — Z96641 Presence of right artificial hip joint: Secondary | ICD-10-CM | POA: Diagnosis not present

## 2015-11-24 DIAGNOSIS — E7800 Pure hypercholesterolemia, unspecified: Secondary | ICD-10-CM | POA: Diagnosis not present

## 2015-12-04 ENCOUNTER — Other Ambulatory Visit: Payer: Self-pay | Admitting: Internal Medicine

## 2015-12-05 DIAGNOSIS — N898 Other specified noninflammatory disorders of vagina: Secondary | ICD-10-CM | POA: Diagnosis not present

## 2015-12-05 DIAGNOSIS — Z6830 Body mass index (BMI) 30.0-30.9, adult: Secondary | ICD-10-CM | POA: Diagnosis not present

## 2015-12-12 DIAGNOSIS — G4440 Drug-induced headache, not elsewhere classified, not intractable: Secondary | ICD-10-CM | POA: Diagnosis not present

## 2015-12-12 DIAGNOSIS — G8929 Other chronic pain: Secondary | ICD-10-CM | POA: Diagnosis not present

## 2015-12-12 DIAGNOSIS — R51 Headache: Secondary | ICD-10-CM | POA: Diagnosis not present

## 2015-12-12 DIAGNOSIS — Z7982 Long term (current) use of aspirin: Secondary | ICD-10-CM | POA: Diagnosis not present

## 2015-12-12 DIAGNOSIS — I10 Essential (primary) hypertension: Secondary | ICD-10-CM | POA: Diagnosis not present

## 2015-12-12 DIAGNOSIS — Z79891 Long term (current) use of opiate analgesic: Secondary | ICD-10-CM | POA: Diagnosis not present

## 2015-12-12 DIAGNOSIS — Z87891 Personal history of nicotine dependence: Secondary | ICD-10-CM | POA: Diagnosis not present

## 2015-12-22 DIAGNOSIS — M792 Neuralgia and neuritis, unspecified: Secondary | ICD-10-CM | POA: Diagnosis not present

## 2015-12-22 DIAGNOSIS — M5136 Other intervertebral disc degeneration, lumbar region: Secondary | ICD-10-CM | POA: Diagnosis not present

## 2015-12-22 DIAGNOSIS — G8929 Other chronic pain: Secondary | ICD-10-CM | POA: Diagnosis not present

## 2015-12-22 DIAGNOSIS — M21371 Foot drop, right foot: Secondary | ICD-10-CM | POA: Diagnosis not present

## 2015-12-22 DIAGNOSIS — G894 Chronic pain syndrome: Secondary | ICD-10-CM | POA: Diagnosis not present

## 2015-12-26 DIAGNOSIS — N9489 Other specified conditions associated with female genital organs and menstrual cycle: Secondary | ICD-10-CM | POA: Diagnosis not present

## 2015-12-26 DIAGNOSIS — N3010 Interstitial cystitis (chronic) without hematuria: Secondary | ICD-10-CM | POA: Diagnosis not present

## 2016-01-23 DIAGNOSIS — G8929 Other chronic pain: Secondary | ICD-10-CM | POA: Diagnosis not present

## 2016-01-23 DIAGNOSIS — M961 Postlaminectomy syndrome, not elsewhere classified: Secondary | ICD-10-CM | POA: Diagnosis not present

## 2016-01-23 DIAGNOSIS — Z79891 Long term (current) use of opiate analgesic: Secondary | ICD-10-CM | POA: Diagnosis not present

## 2016-01-23 DIAGNOSIS — G64 Other disorders of peripheral nervous system: Secondary | ICD-10-CM | POA: Diagnosis not present

## 2016-01-23 DIAGNOSIS — M5136 Other intervertebral disc degeneration, lumbar region: Secondary | ICD-10-CM | POA: Diagnosis not present

## 2016-01-30 DIAGNOSIS — N760 Acute vaginitis: Secondary | ICD-10-CM | POA: Diagnosis not present

## 2016-01-30 DIAGNOSIS — N390 Urinary tract infection, site not specified: Secondary | ICD-10-CM | POA: Diagnosis not present

## 2016-02-20 DIAGNOSIS — M961 Postlaminectomy syndrome, not elsewhere classified: Secondary | ICD-10-CM | POA: Diagnosis not present

## 2016-02-20 DIAGNOSIS — M5136 Other intervertebral disc degeneration, lumbar region: Secondary | ICD-10-CM | POA: Diagnosis not present

## 2016-02-20 DIAGNOSIS — M21371 Foot drop, right foot: Secondary | ICD-10-CM | POA: Diagnosis not present

## 2016-02-20 DIAGNOSIS — N3010 Interstitial cystitis (chronic) without hematuria: Secondary | ICD-10-CM | POA: Diagnosis not present

## 2016-02-20 DIAGNOSIS — Q282 Arteriovenous malformation of cerebral vessels: Secondary | ICD-10-CM | POA: Diagnosis not present

## 2016-02-20 DIAGNOSIS — G8929 Other chronic pain: Secondary | ICD-10-CM | POA: Diagnosis not present

## 2016-02-20 DIAGNOSIS — Z79891 Long term (current) use of opiate analgesic: Secondary | ICD-10-CM | POA: Diagnosis not present

## 2016-02-26 DIAGNOSIS — M7501 Adhesive capsulitis of right shoulder: Secondary | ICD-10-CM | POA: Diagnosis not present

## 2016-03-14 DIAGNOSIS — I671 Cerebral aneurysm, nonruptured: Secondary | ICD-10-CM | POA: Diagnosis not present

## 2016-03-15 DIAGNOSIS — G8929 Other chronic pain: Secondary | ICD-10-CM | POA: Diagnosis not present

## 2016-03-15 DIAGNOSIS — R51 Headache: Secondary | ICD-10-CM | POA: Diagnosis not present

## 2016-03-15 DIAGNOSIS — Z79891 Long term (current) use of opiate analgesic: Secondary | ICD-10-CM | POA: Diagnosis not present

## 2016-03-15 DIAGNOSIS — I671 Cerebral aneurysm, nonruptured: Secondary | ICD-10-CM | POA: Diagnosis not present

## 2016-03-21 DIAGNOSIS — Z01818 Encounter for other preprocedural examination: Secondary | ICD-10-CM | POA: Diagnosis not present

## 2016-03-21 DIAGNOSIS — Z96643 Presence of artificial hip joint, bilateral: Secondary | ICD-10-CM | POA: Diagnosis not present

## 2016-03-21 DIAGNOSIS — M7061 Trochanteric bursitis, right hip: Secondary | ICD-10-CM | POA: Diagnosis not present

## 2016-03-21 DIAGNOSIS — Z7982 Long term (current) use of aspirin: Secondary | ICD-10-CM | POA: Diagnosis not present

## 2016-03-21 DIAGNOSIS — Z87891 Personal history of nicotine dependence: Secondary | ICD-10-CM | POA: Diagnosis not present

## 2016-03-21 DIAGNOSIS — I10 Essential (primary) hypertension: Secondary | ICD-10-CM | POA: Diagnosis not present

## 2016-03-21 DIAGNOSIS — M160 Bilateral primary osteoarthritis of hip: Secondary | ICD-10-CM | POA: Diagnosis not present

## 2016-03-21 DIAGNOSIS — M5431 Sciatica, right side: Secondary | ICD-10-CM | POA: Diagnosis not present

## 2016-03-21 DIAGNOSIS — M7062 Trochanteric bursitis, left hip: Secondary | ICD-10-CM | POA: Diagnosis not present

## 2016-03-23 DIAGNOSIS — B379 Candidiasis, unspecified: Secondary | ICD-10-CM | POA: Diagnosis not present

## 2016-03-23 DIAGNOSIS — N760 Acute vaginitis: Secondary | ICD-10-CM | POA: Diagnosis not present

## 2016-03-23 DIAGNOSIS — Z23 Encounter for immunization: Secondary | ICD-10-CM | POA: Diagnosis not present

## 2016-04-09 DIAGNOSIS — B079 Viral wart, unspecified: Secondary | ICD-10-CM | POA: Diagnosis not present

## 2016-04-09 DIAGNOSIS — K219 Gastro-esophageal reflux disease without esophagitis: Secondary | ICD-10-CM | POA: Diagnosis not present

## 2016-04-09 DIAGNOSIS — K5904 Chronic idiopathic constipation: Secondary | ICD-10-CM | POA: Diagnosis not present

## 2016-04-18 DIAGNOSIS — Z79891 Long term (current) use of opiate analgesic: Secondary | ICD-10-CM | POA: Diagnosis not present

## 2016-04-18 DIAGNOSIS — G894 Chronic pain syndrome: Secondary | ICD-10-CM | POA: Diagnosis not present

## 2016-04-19 DIAGNOSIS — N760 Acute vaginitis: Secondary | ICD-10-CM | POA: Diagnosis not present

## 2016-04-19 DIAGNOSIS — N390 Urinary tract infection, site not specified: Secondary | ICD-10-CM | POA: Diagnosis not present

## 2016-04-26 DIAGNOSIS — E1165 Type 2 diabetes mellitus with hyperglycemia: Secondary | ICD-10-CM | POA: Diagnosis not present

## 2016-04-26 DIAGNOSIS — E785 Hyperlipidemia, unspecified: Secondary | ICD-10-CM | POA: Diagnosis not present

## 2016-05-29 DIAGNOSIS — Z79891 Long term (current) use of opiate analgesic: Secondary | ICD-10-CM | POA: Diagnosis not present

## 2016-05-29 DIAGNOSIS — M5136 Other intervertebral disc degeneration, lumbar region: Secondary | ICD-10-CM | POA: Diagnosis not present

## 2016-05-29 DIAGNOSIS — N3010 Interstitial cystitis (chronic) without hematuria: Secondary | ICD-10-CM | POA: Diagnosis not present

## 2016-05-29 DIAGNOSIS — G894 Chronic pain syndrome: Secondary | ICD-10-CM | POA: Diagnosis not present

## 2016-05-29 DIAGNOSIS — M961 Postlaminectomy syndrome, not elsewhere classified: Secondary | ICD-10-CM | POA: Diagnosis not present

## 2016-06-17 ENCOUNTER — Other Ambulatory Visit: Payer: Self-pay | Admitting: Internal Medicine

## 2016-06-20 DIAGNOSIS — K5904 Chronic idiopathic constipation: Secondary | ICD-10-CM | POA: Diagnosis not present

## 2016-06-20 DIAGNOSIS — K219 Gastro-esophageal reflux disease without esophagitis: Secondary | ICD-10-CM | POA: Diagnosis not present

## 2016-06-20 DIAGNOSIS — R140 Abdominal distension (gaseous): Secondary | ICD-10-CM | POA: Diagnosis not present

## 2016-06-20 DIAGNOSIS — R110 Nausea: Secondary | ICD-10-CM | POA: Diagnosis not present

## 2016-07-05 ENCOUNTER — Other Ambulatory Visit: Payer: Self-pay | Admitting: Internal Medicine

## 2016-07-11 DIAGNOSIS — R309 Painful micturition, unspecified: Secondary | ICD-10-CM | POA: Diagnosis not present

## 2016-07-11 DIAGNOSIS — N760 Acute vaginitis: Secondary | ICD-10-CM | POA: Diagnosis not present

## 2016-07-20 ENCOUNTER — Other Ambulatory Visit: Payer: Self-pay | Admitting: Internal Medicine

## 2016-07-23 DIAGNOSIS — M21371 Foot drop, right foot: Secondary | ICD-10-CM | POA: Diagnosis not present

## 2016-07-23 DIAGNOSIS — Z79891 Long term (current) use of opiate analgesic: Secondary | ICD-10-CM | POA: Diagnosis not present

## 2016-07-23 DIAGNOSIS — M5136 Other intervertebral disc degeneration, lumbar region: Secondary | ICD-10-CM | POA: Diagnosis not present

## 2016-07-23 DIAGNOSIS — F329 Major depressive disorder, single episode, unspecified: Secondary | ICD-10-CM | POA: Diagnosis not present

## 2016-08-21 DIAGNOSIS — M21371 Foot drop, right foot: Secondary | ICD-10-CM | POA: Diagnosis not present

## 2016-08-21 DIAGNOSIS — G8929 Other chronic pain: Secondary | ICD-10-CM | POA: Diagnosis not present

## 2016-08-21 DIAGNOSIS — G894 Chronic pain syndrome: Secondary | ICD-10-CM | POA: Diagnosis not present

## 2016-08-21 DIAGNOSIS — Z79891 Long term (current) use of opiate analgesic: Secondary | ICD-10-CM | POA: Diagnosis not present

## 2016-08-21 DIAGNOSIS — M961 Postlaminectomy syndrome, not elsewhere classified: Secondary | ICD-10-CM | POA: Diagnosis not present

## 2016-08-21 DIAGNOSIS — N3010 Interstitial cystitis (chronic) without hematuria: Secondary | ICD-10-CM | POA: Diagnosis not present

## 2016-08-28 ENCOUNTER — Other Ambulatory Visit: Payer: Self-pay | Admitting: Internal Medicine

## 2016-09-10 DIAGNOSIS — M5125 Other intervertebral disc displacement, thoracolumbar region: Secondary | ICD-10-CM | POA: Diagnosis not present

## 2016-09-10 DIAGNOSIS — Z4789 Encounter for other orthopedic aftercare: Secondary | ICD-10-CM | POA: Diagnosis not present

## 2016-09-10 DIAGNOSIS — M5124 Other intervertebral disc displacement, thoracic region: Secondary | ICD-10-CM | POA: Diagnosis not present

## 2016-09-10 DIAGNOSIS — M47816 Spondylosis without myelopathy or radiculopathy, lumbar region: Secondary | ICD-10-CM | POA: Diagnosis not present

## 2016-09-10 DIAGNOSIS — M5127 Other intervertebral disc displacement, lumbosacral region: Secondary | ICD-10-CM | POA: Diagnosis not present

## 2016-09-10 DIAGNOSIS — M47817 Spondylosis without myelopathy or radiculopathy, lumbosacral region: Secondary | ICD-10-CM | POA: Diagnosis not present

## 2016-09-10 DIAGNOSIS — M5126 Other intervertebral disc displacement, lumbar region: Secondary | ICD-10-CM | POA: Diagnosis not present

## 2016-09-23 DIAGNOSIS — M5136 Other intervertebral disc degeneration, lumbar region: Secondary | ICD-10-CM | POA: Diagnosis not present

## 2016-09-23 DIAGNOSIS — M21371 Foot drop, right foot: Secondary | ICD-10-CM | POA: Diagnosis not present

## 2016-09-23 DIAGNOSIS — Z79891 Long term (current) use of opiate analgesic: Secondary | ICD-10-CM | POA: Diagnosis not present

## 2016-09-23 DIAGNOSIS — G894 Chronic pain syndrome: Secondary | ICD-10-CM | POA: Diagnosis not present

## 2016-09-23 DIAGNOSIS — M47816 Spondylosis without myelopathy or radiculopathy, lumbar region: Secondary | ICD-10-CM | POA: Diagnosis not present

## 2016-09-23 DIAGNOSIS — M961 Postlaminectomy syndrome, not elsewhere classified: Secondary | ICD-10-CM | POA: Diagnosis not present

## 2016-10-03 ENCOUNTER — Emergency Department (HOSPITAL_COMMUNITY)
Admission: EM | Admit: 2016-10-03 | Discharge: 2016-10-03 | Disposition: A | Discharge status: Home / Self Care | Payer: Medicare HMO | Attending: Emergency Medicine | Admitting: Emergency Medicine

## 2016-10-03 ENCOUNTER — Encounter (HOSPITAL_COMMUNITY): Payer: Self-pay | Admitting: Emergency Medicine

## 2016-10-03 DIAGNOSIS — E876 Hypokalemia: Secondary | ICD-10-CM | POA: Diagnosis not present

## 2016-10-03 DIAGNOSIS — E119 Type 2 diabetes mellitus without complications: Secondary | ICD-10-CM | POA: Diagnosis not present

## 2016-10-03 DIAGNOSIS — R410 Disorientation, unspecified: Secondary | ICD-10-CM | POA: Diagnosis not present

## 2016-10-03 DIAGNOSIS — F1193 Opioid use, unspecified with withdrawal: Secondary | ICD-10-CM

## 2016-10-03 DIAGNOSIS — Z7982 Long term (current) use of aspirin: Secondary | ICD-10-CM | POA: Insufficient documentation

## 2016-10-03 DIAGNOSIS — Z96643 Presence of artificial hip joint, bilateral: Secondary | ICD-10-CM | POA: Insufficient documentation

## 2016-10-03 DIAGNOSIS — J45909 Unspecified asthma, uncomplicated: Secondary | ICD-10-CM | POA: Diagnosis not present

## 2016-10-03 DIAGNOSIS — F1123 Opioid dependence with withdrawal: Secondary | ICD-10-CM | POA: Diagnosis not present

## 2016-10-03 DIAGNOSIS — Z91040 Latex allergy status: Secondary | ICD-10-CM | POA: Insufficient documentation

## 2016-10-03 DIAGNOSIS — Z79899 Other long term (current) drug therapy: Secondary | ICD-10-CM | POA: Diagnosis not present

## 2016-10-03 DIAGNOSIS — Z87891 Personal history of nicotine dependence: Secondary | ICD-10-CM | POA: Insufficient documentation

## 2016-10-03 DIAGNOSIS — R4182 Altered mental status, unspecified: Secondary | ICD-10-CM | POA: Diagnosis present

## 2016-10-03 DIAGNOSIS — R1084 Generalized abdominal pain: Secondary | ICD-10-CM | POA: Diagnosis not present

## 2016-10-03 HISTORY — DX: Type 2 diabetes mellitus without complications: E11.9

## 2016-10-03 LAB — RAPID URINE DRUG SCREEN, HOSP PERFORMED
AMPHETAMINES: NOT DETECTED
BARBITURATES: NOT DETECTED
BENZODIAZEPINES: POSITIVE — AB
Cocaine: NOT DETECTED
Opiates: POSITIVE — AB
Tetrahydrocannabinol: NOT DETECTED

## 2016-10-03 LAB — ETHANOL

## 2016-10-03 LAB — COMPREHENSIVE METABOLIC PANEL
ALT: 17 U/L (ref 14–54)
AST: 28 U/L (ref 15–41)
Albumin: 4.3 g/dL (ref 3.5–5.0)
Alkaline Phosphatase: 75 U/L (ref 38–126)
Anion gap: 10 (ref 5–15)
CHLORIDE: 104 mmol/L (ref 101–111)
CO2: 33 mmol/L — AB (ref 22–32)
CREATININE: 0.63 mg/dL (ref 0.44–1.00)
Calcium: 9.2 mg/dL (ref 8.9–10.3)
GFR calc Af Amer: >60 mL/min (ref >60)
GFR calc non Af Amer: >60 mL/min (ref >60)
GLUCOSE: 98 mg/dL (ref 65–99)
Potassium: 2.5 mmol/L — CL (ref 3.5–5.1)
SODIUM: 147 mmol/L — AB (ref 135–145)
Total Bilirubin: 0.4 mg/dL (ref 0.3–1.2)
Total Protein: 8 g/dL (ref 6.5–8.1)

## 2016-10-03 LAB — CBC WITH DIFFERENTIAL/PLATELET
BASOS ABS: 0 10*3/uL (ref 0.0–0.1)
Basophils Relative: 0 %
EOS ABS: 0 10*3/uL (ref 0.0–0.7)
EOS PCT: 0 %
HCT: 40 % (ref 36.0–46.0)
HEMOGLOBIN: 12.9 g/dL (ref 12.0–15.0)
LYMPHS PCT: 9 %
Lymphs Abs: 1 10*3/uL (ref 0.7–4.0)
MCH: 27.8 pg (ref 26.0–34.0)
MCHC: 32.3 g/dL (ref 30.0–36.0)
MCV: 86.2 fL (ref 78.0–100.0)
Monocytes Absolute: 0.5 10*3/uL (ref 0.1–1.0)
Monocytes Relative: 4 %
NEUTROS PCT: 87 %
Neutro Abs: 9.8 10*3/uL — ABNORMAL HIGH (ref 1.7–7.7)
PLATELETS: 326 10*3/uL (ref 150–400)
RBC: 4.64 MIL/uL (ref 3.87–5.11)
RDW: 14 % (ref 11.5–15.5)
WBC: 11.3 10*3/uL — AB (ref 4.0–10.5)

## 2016-10-03 LAB — URINALYSIS, ROUTINE W REFLEX MICROSCOPIC
Bilirubin Urine: NEGATIVE
GLUCOSE, UA: NEGATIVE mg/dL
Hgb urine dipstick: NEGATIVE
Ketones, ur: NEGATIVE mg/dL
LEUKOCYTES UA: NEGATIVE
Nitrite: NEGATIVE
PH: 8 (ref 5.0–8.0)
PROTEIN: NEGATIVE mg/dL
SPECIFIC GRAVITY, URINE: 1.002 — AB (ref 1.005–1.030)

## 2016-10-03 LAB — I-STAT TROPONIN, ED: Troponin i, poc: 0.01 ng/mL (ref 0.00–0.08)

## 2016-10-03 MED ORDER — SODIUM CHLORIDE 0.9 % IV SOLN: 30.0000 meq | Freq: Once | INTRAVENOUS | Status: DC

## 2016-10-03 MED ORDER — HYDROMORPHONE HCL 1 MG/ML IJ SOLN
2.0000 mg | Freq: Once | INTRAMUSCULAR | Status: AC
  Administered 2016-10-03: 2 mg via INTRAVENOUS
  Filled 2016-10-03: qty 2

## 2016-10-03 MED ORDER — SODIUM CHLORIDE 0.9 % IV BOLUS (SEPSIS)
1000.0000 mL | Freq: Once | INTRAVENOUS | Status: AC
  Administered 2016-10-03: 1000 mL via INTRAVENOUS

## 2016-10-03 MED ORDER — POTASSIUM CHLORIDE CRYS ER 20 MEQ PO TBCR
40.0000 meq | EXTENDED_RELEASE_TABLET | Freq: Once | ORAL | Status: AC
  Administered 2016-10-03: 40 meq via ORAL
  Filled 2016-10-03: qty 2

## 2016-10-03 MED ORDER — DICYCLOMINE HCL 20 MG PO TABS: 20.0000 mg | ORAL_TABLET | Freq: Two times a day (BID) | ORAL | 0 refills | Status: DC

## 2016-10-03 MED ORDER — LORAZEPAM 2 MG/ML IJ SOLN
INTRAMUSCULAR | Status: AC
  Administered 2016-10-03: 1 mg via INTRAMUSCULAR
  Filled 2016-10-03: qty 1

## 2016-10-03 MED ORDER — GI COCKTAIL ~~LOC~~
30.0000 mL | Freq: Once | ORAL | Status: AC
  Administered 2016-10-03: 30 mL via ORAL
  Filled 2016-10-03: qty 30

## 2016-10-03 MED ORDER — HYDROMORPHONE HCL 1 MG/ML IJ SOLN: 1.0000 mg | Freq: Once | INTRAMUSCULAR | Status: DC

## 2016-10-03 MED ORDER — DICYCLOMINE HCL 10 MG PO CAPS
10.0000 mg | ORAL_CAPSULE | Freq: Once | ORAL | Status: AC
  Administered 2016-10-03: 10 mg via ORAL
  Filled 2016-10-03: qty 1

## 2016-10-03 MED ORDER — LORAZEPAM 2 MG/ML IJ SOLN
1.0000 mg | Freq: Once | INTRAMUSCULAR | Status: AC
  Administered 2016-10-03: 1 mg via INTRAMUSCULAR

## 2016-10-03 MED ORDER — HYDROMORPHONE HCL 2 MG PO TABS
4.0000 mg | ORAL_TABLET | Freq: Once | ORAL | Status: AC
  Administered 2016-10-03: 4 mg via ORAL
  Filled 2016-10-03: qty 2

## 2016-10-03 MED ORDER — ONDANSETRON HCL 4 MG/2ML IJ SOLN
4.0000 mg | Freq: Once | INTRAMUSCULAR | Status: AC
  Administered 2016-10-03: 4 mg via INTRAVENOUS
  Filled 2016-10-03: qty 2

## 2016-10-03 MED ORDER — POTASSIUM CHLORIDE ER 10 MEQ PO TBCR: EXTENDED_RELEASE_TABLET | ORAL | 0 refills | Status: AC

## 2016-10-03 MED ORDER — POTASSIUM CHLORIDE 10 MEQ/100ML IV SOLN
10.0000 meq | INTRAVENOUS | Status: AC
  Administered 2016-10-03 (×2): 10 meq via INTRAVENOUS
  Filled 2016-10-03 (×3): qty 100

## 2016-10-03 NOTE — Discharge Instructions (Signed)
Continue all your regular medications. Take potassium 4 tablets in the morning, 4 tablets tomorrow evening, and then 2 tablets twice a day until all gone. Take Bentyl as prescribed as needed for abdominal cramping. Please follow-up with her doctor for recheck of 1-2 days. Return if worsening symptoms.

## 2016-10-03 NOTE — ED Notes (Signed)
Fall precautions put in place- yellow socks & armband, stretcher alarm. Son at bedside

## 2016-10-03 NOTE — ED Notes (Signed)
Pt has been placed on bed pan, no success getting urine, RN Cherrelle assisted pt to restroom, unable to obtain urine sample. Pt too unstable

## 2016-10-03 NOTE — ED Notes (Signed)
Bed: WA05 Expected date:  Expected time:  Means of arrival:  Comments: EMS-abdominal pain 

## 2016-10-03 NOTE — ED Notes (Signed)
PA and RN made aware

## 2016-10-03 NOTE — ED Provider Notes (Signed)
[facility] DEPT Provider Note   CSN: [id number] Arrival date & time: 10/03/16  1637     History   Chief Complaint No chief complaint on file.   HPI Yolanda Yang is a 61 y.o. female.  HPI Yolanda Yang is a 61 y.o. female presents to emergency department with altered mental status. EMS called from home by patient's son. Patient had sudden onset of altered mental status changes, hallucinations, pain, anxiety after taking intranasal Narcan. Patient apparently went to pick up her refill prescription on her chronic pain medications and was given intranasal Narcan with her prescription for the first time today. She normally takes 4-6 mg of Dilaudid every 4 hours for chronic back pain. She states she wasn't aware what Narcan was 4 and thought it was going to help her with pain and states she took 2 doses of 4 mg intranasal Narcan. Shortly after that patient became agitated, hallucinating, talking out of her head. When EMS arrived at the scene, patient was masturbating in the bathtub and was unable to stop. Patient is complaining of severe abdominal and back pain at this time.   Past Medical History:  Diagnosis Date  . Anxiety   . Asthma exacerbation    PULMOLOGIST-- DR RAMASWANY  . Bladder pain   . Borderline type 2 diabetes mellitus    DIET CONTROLLED  . Cerebral aneurysm, nonruptured monitored at [facility] by dr Anne Shutter   currently has x4 stable aneurysm's (x2 left side & x2 right side)--  2012 s/p coiling of 4 aneurysm's  . Chronic constipation   . Cystitis, interstitial   . Diabetes mellitus without complication ([place])   . GERD (gastroesophageal reflux disease)   . Headache syndrome    due to cerebral aneurysm's  . History of cerebral aneurysm repair    MULTIPLE  ANEURYSM--  S/P  COILING X4  ([facility])  . History of chronic sinusitis   . History of duodenal ulcer    2007  . History of gastric ulcer   . History of GI bleed    UPPER--  2007  AND 05-2011  . History of  recurrent UTIs   . Hyperlipemia   . IBS (irritable bowel syndrome)   . Incomplete emptying of bladder   . OA (osteoarthritis)   . Right foot drop    POST LUMBAR SURGERY 2011  . Seasonal allergic rhinitis   . Urinary hesitancy   . Wears dentures    currently being fitted for full dentures  . Wears glasses     Patient Active Problem List   Diagnosis Date Noted  . Macromastia 06/09/2014  . Acute sinusitis 05/19/2014  . Asthma with acute exacerbation 05/19/2014  . Dyspnea 04/02/2013  . Status post angioplasty with stent 05/16/2011  . Hypotension due to blood loss 05/16/2011  . Tobacco abuse 05/16/2011  . Leukocytosis 05/16/2011  . Acute GI bleeding 05/15/2011  . Anemia associated with acute blood loss 05/15/2011  . HYPERLIPIDEMIA 12/15/2006  . DEPENDENCE, DRUG NOS, CONTINUOUS 12/15/2006  . ALLERGIC RHINITIS 12/15/2006  . Asthma 12/15/2006  . GERD 12/15/2006  . CYSTITIS, CHRONIC INTERSTITIAL 12/15/2006  . FIBROADENOSIS, BREAST 12/15/2006  . ENDOMETRIOSIS NOS 12/15/2006  . STATE, SYMPTOMATIC MENOPAUSE/FEM CLIMACTERIC 12/15/2006  . LOW BACK PAIN, CHRONIC 12/15/2006  . History of duodenal ulcer 12/15/2006  . LUMPECTOMY, BREAST, HX OF 12/15/2006  . HYSTERECTOMY, HX OF 12/15/2006    Past Surgical History:  Procedure Laterality Date  . ABDOMINAL HYSTERECTOMY  1997  . ANEURYSM COILING  05-08-2011  [facility]   INTRACRANIAL COILING X4  and Right Carotid stenting due to intraoperative injury  . ANTERIOR CERVICAL DECOMP/DISCECTOMY FUSION  2002   C4 -- C7  . BREAST REDUCTION SURGERY Bilateral 06/09/2014   Procedure: BILATERAL MAMMARY REDUCTION  (BREAST);  Surgeon: Irene Limbo, MD;  Location: [facility];  Service: Plastics;  Laterality: Bilateral;  . CYSTO/  HYDRODISTENTION/  INSTILLATION THERAPY  07-15-2006  . CYSTOSCOPY WITH HYDRODISTENSION AND BIOPSY N/A 09/08/2014   Procedure: CYSTOSCOPY/ HYDRODISTENSION OF BLADDER/ INSTILLATION OF MARCAINE AND PYRIDIUM;  Surgeon: Bjorn Loser, MD;  Location: [facility];  Service: Urology;  Laterality: N/A;  . ESOPHAGOGASTRODUODENOSCOPY  05/17/2011   Procedure: ESOPHAGOGASTRODUODENOSCOPY (EGD);  Surgeon: Beryle Beams;  [facility]COPY;  Service: Endoscopy;  Laterality: N/A;  . LEFT BREAST LUMPECTOMY  2000   benign  . NEGATIVE SLEEP STUDY  2011  PER PT  . POSTERIOR FUSION CERVICAL SPINE  2004   Revision C4 -- C7  . POSTERIOR LAMINECTOMY / DECOMPRESSION LUMBAR SPINE  05-17-2010   L3 -- L5  . ROTATOR CUFF REPAIR Right 2014  . TONSILLECTOMY  age 61  . TOTAL HIP ARTHROPLASTY Bilateral may & july 2013    OB History    No data available       Home Medications    Prior to Admission medications   Medication Sig Start Date End Date Taking? Authorizing Provider  acetaminophen (TYLENOL) 650 MG CR tablet Take 1,300 mg by mouth every 8 (eight) hours as needed for pain.    Historical Provider, MD  albuterol (PROVENTIL) (2.5 MG/3ML) 0.083% nebulizer solution INHALE THE CONTENTS OF 1 VIAL IN NEBULIZER EVERY 12 HOURS AS NEEDED 08/07/15   Brand Males, MD  aspirin 81 MG chewable tablet Chew 81 mg by mouth daily.     Historical Provider, MD  azithromycin (ZITHROMAX Z-PAK) 250 MG tablet Take 2 pills today, then 1 pill daily until gone. 08/15/15   Melony Overly, MD  beclomethasone (QVAR) 80 MCG/ACT inhaler Inhale 2 puffs into the lungs 2 (two) times daily. 08/29/15   Brand Males, MD  butalbital-acetaminophen-caffeine (FIORICET, ESGIC) 50-325-40 MG per tablet Take 1 tablet by mouth daily as needed for headache.     Historical Provider, MD  clindamycin (CLEOCIN) 300 MG capsule Take 300 mg by mouth daily.     Historical Provider, MD  cyclobenzaprine (FLEXERIL) 10 MG tablet Take 10 mg by mouth 3 (three) times daily as needed for muscle spasms.     Historical Provider, MD  diazepam (VALIUM) 5 MG tablet Take 5 mg by mouth every 6 (six) hours as needed for anxiety.    Historical Provider, MD    Diphenhyd-Hydrocort-Nystatin (FIRST-DUKES MOUTHWASH) SUSP Use as directed 5 mLs in the mouth or throat 3 (three) times daily as needed. 06/09/15   Tammy S Parrett, NP  DULoxetine (CYMBALTA) 60 MG capsule Take 60 mg by mouth every evening.     Historical Provider, MD  fexofenadine (ALLEGRA) 180 MG tablet Take 180 mg by mouth daily as needed for allergies or rhinitis.    Historical Provider, MD  Fluticasone-Salmeterol (ADVAIR DISKUS) 250-50 MCG/DOSE AEPB Inhale 1 puff into the lungs 2 (two) times daily. 12/04/15   Brand Males, MD  gabapentin (NEURONTIN) 600 MG tablet Take 600 mg by mouth 3 (three) times daily.    Historical Provider, MD  guaiFENesin (MUCINEX) 600 MG 12 hr tablet Take by mouth 2 (two) times daily as needed for to loosen phlegm.    Historical Provider, MD  HYDROmorphone (DILAUDID) 4 MG tablet Take 1-1.5 tablets (4-6 mg total) by mouth every 4 (four) hours as needed for severe pain. 06/10/14   Irene Limbo, MD  Linaclotide (LINZESS) 290 MCG CAPS capsule Take 290 mcg by mouth every evening.     Historical Provider, MD  Meth-Hyo-M Bl-Na Phos-Ph Sal (URIBEL) 118 MG CAPS Take 2 capsules by mouth 2 (two) times daily as needed (interstitial cystitis).    Historical Provider, MD  nitrofurantoin, macrocrystal-monohydrate, (MACROBID) 100 MG capsule Take 100 mg by mouth daily.    Historical Provider, MD  nystatin cream (MYCOSTATIN) Apply 1 application topically at bedtime. PT USES PRN QHS WHEN ON ANTIBIOTICS TO PREVENT YEAST INFECTION    Historical Provider, MD  pantoprazole (PROTONIX) 40 MG tablet Take 1 tablet (40 mg total) by mouth 2 (two) times daily before a meal. 05/20/11   Hosie Poisson, MD  pentosan polysulfate (ELMIRON) 100 MG capsule Take 100 mg by mouth 2 (two) times daily.     Historical Provider, MD  Probiotic Product (PROBIOTIC DAILY PO) Take 1 capsule by mouth every evening.    Historical Provider, MD  promethazine (PHENERGAN) 12.5 MG tablet Take 12.5 mg by mouth every 6 (six)  hours as needed for nausea.    Historical Provider, MD  ranitidine (ZANTAC) 150 MG capsule Take 1 capsule (150 mg total) by mouth 2 (two) times daily. 08/15/15   Melony Overly, MD  rosuvastatin (CRESTOR) 10 MG tablet Take 10 mg by mouth every evening.     Historical Provider, MD  SPIRIVA HANDIHALER 18 MCG inhalation capsule INHALE THE CONTENTS OF 1 CAPSULE EVERY DAY 07/05/16   Brand Males, MD  sucralfate (CARAFATE) 1 GM/10ML suspension Take 10 mLs (1 g total) by mouth 4 (four) times daily -  with meals and at bedtime. 08/15/15   Melony Overly, MD  VENTOLIN HFA 108 (90 Base) MCG/ACT inhaler INHALE 2 PUFFS INTO THE LUNGS EVERY 4 HOURS AS NEEDED FOR WHEEZING OR SHORTNESS OF BREATH. NEED OFFICE VISIT 08/28/16   Brand Males, MD    Family History Family History  Problem Relation Age of Onset  . Colon cancer Father   . Leukemia Father     Social History Social History  Substance Use Topics  . Smoking status: Former Smoker    Packs/day: 0.50    Years: 20.00    Types: Cigarettes    Quit date: 08/02/2011  . Smokeless tobacco: Never Used  . Alcohol use No     Allergies   Erythromycin and Latex   Review of Systems Review of Systems  Unable to perform ROS: Mental status change  Gastrointestinal: Positive for abdominal pain and nausea.  Musculoskeletal: Positive for back pain.  Psychiatric/Behavioral: Positive for agitation, confusion and hallucinations. The patient is nervous/anxious.      Physical Exam Updated Vital Signs There were no vitals taken for this visit.  Physical Exam  Constitutional: She appears well-developed and well-nourished.  Diaphoretic, clothes are wet. Pt throwing herself around in stretcher, moaning  HENT:  Head: Normocephalic and atraumatic.  Eyes: Conjunctivae and EOM are normal. Pupils are equal, round, and reactive to light.  Neck: Normal range of motion. Neck supple.  Cardiovascular: Regular rhythm and normal heart sounds.   tachycardic    Pulmonary/Chest: Effort normal and breath sounds normal. No respiratory distress. She has no wheezes. She has no rales.  Abdominal: Soft. Bowel sounds are normal. She exhibits no distension. There is no tenderness. There is no rebound.  Musculoskeletal: She exhibits  no edema.  Neurological: She is alert.  Oriented to self and place. Moving all extremities. strenght 5/5 and equal. Unable to test coordination or strength, pt is not following all directions, rolling around stretcher.   Skin: Skin is warm and dry. Capillary refill takes less than 2 seconds.  Psychiatric: She has a normal mood and affect. Her behavior is normal.  Nursing note and vitals reviewed.    ED Treatments / Results  Labs (all labs ordered are listed, but only abnormal results are displayed) Labs Reviewed  CBC WITH DIFFERENTIAL/PLATELET - Abnormal; Notable for the following:       Result Value   WBC 11.3 (*)    Neutro Abs 9.8 (*)    All other components within normal limits  COMPREHENSIVE METABOLIC PANEL - Abnormal; Notable for the following:    Sodium 147 (*)    Potassium 2.5 (*)    CO2 33 (*)    BUN <5 (*)    All other components within normal limits  URINALYSIS, ROUTINE W REFLEX MICROSCOPIC - Abnormal; Notable for the following:    Color, Urine COLORLESS (*)    Specific Gravity, Urine 1.002 (*)    All other components within normal limits  RAPID URINE DRUG SCREEN, HOSP PERFORMED - Abnormal; Notable for the following:    Opiates POSITIVE (*)    Benzodiazepines POSITIVE (*)    All other components within normal limits  ETHANOL  I-STAT TROPOININ, ED    EKG  EKG Interpretation  Date/Time:  Thursday October 03 2016 18:25:18 EDT Ventricular Rate:  85 PR Interval:    QRS Duration: 89 QT Interval:  412 QTC Calculation: 490 R Axis:   22 Text Interpretation:  Sinus rhythm Ventricular premature complex normal. no change from previous except rare PVC. Confirmed by Johnney Killian, MD, Jeannie Done [id number]) on 10/03/2016  6:52:58 PM       Radiology No results found.  Procedures Procedures (including critical care time)  Medications Ordered in ED Medications  potassium chloride 10 mEq in 100 mL IVPB (10 mEq Intravenous New Bag/Given 10/03/16 2106)  dicyclomine (BENTYL) capsule 10 mg (not administered)  gi cocktail (Maalox,Lidocaine,Donnatal) (not administered)  ondansetron (ZOFRAN) injection 4 mg (4 mg Intravenous Given 10/03/16 1723)  sodium chloride 0.9 % bolus 1,000 mL (0 mLs Intravenous Stopped 10/03/16 2003)  HYDROmorphone (DILAUDID) injection 2 mg (2 mg Intravenous Given 10/03/16 1723)  LORazepam (ATIVAN) injection 1 mg (1 mg Intramuscular Given 10/03/16 1732)  HYDROmorphone (DILAUDID) tablet 4 mg (4 mg Oral Given 10/03/16 2032)  potassium chloride SA (K-DUR,KLOR-CON) CR tablet 40 mEq (40 mEq Oral Given 10/03/16 2032)     Initial Impression / Assessment and Plan / ED Course  I have reviewed the triage vital signs and the nursing notes.  Pertinent labs & imaging results that were available during my care of the patient were reviewed by me and considered in my medical decision making (see chart for details).     Recent with acute agitation and mental status change after taking Narcan. Patient is on chronic pain medications and takes 4-6 mg of Dilaudid every 4 hours for chronic pain. I suspect that her taking Narcan through her into opiate withdrawals. She is tachycardic, she appears to be anxious, complaining of pain. Will give IV pain medications, Dilaudid ordered, will get labs, drug screen, will monitor on cardiac monitor.   6:03 PM Pt with only mild improvement after dilaudid IM, unable to get IV due to pt's agitation and unable to keep still. States feels  like skin is crawling. Ativan IM given, pt feels better. RN to place IV and draw labs  9:00 PM Patient is now awake, alert, feels much better. She is back to normal, still continues to have some abdominal cramping. She is drinking oral fluids. Her  hallucinations, skin crawling sensation, agitation has now resolved. I will order her her regular doses of oral Dilaudid. We will continue to monitor. She is receiving potassium IV, for potassium of 2.5. No EKG changes.  10:46 PM She is not tolerating IV potassium, she did receive approximately 50 mEq IV and 40 mEq by mouth. She is wanting to go home. She continues to feel improved and back has baseline other than some abdominal cramping. She was able to walk to the bathroom and back with no difficulty. I will discharge her home with potassium prescription of Bentyl for abdominal cramping. Advised to continue her regular medications as prescribed by her physician. Her vital signs are normal. She is in no acute distress. Return precautions discussed.  Vitals:   10/03/16 1655 10/03/16 1941 10/03/16 2000 10/03/16 2100  BP: (!) 158/122 133/84 133/72 121/62  Pulse: (!) 107 80    Resp: 20 18 16    Temp: 98.5 F (36.9 C)     TempSrc: Oral     SpO2: 95% 98%      Final Clinical Impressions(s) / ED Diagnoses   Final diagnoses:  Opioid withdrawal delirium (HCC)  Hypokalemia    New Prescriptions New Prescriptions   DICYCLOMINE (BENTYL) 20 MG TABLET    Take 1 tablet (20 mg total) by mouth 2 (two) times daily.   POTASSIUM CHLORIDE (K-DUR) 10 MEQ TABLET    Take 40mg  twice a day tomorrow, and then 20mg  twice a day for two more days     Jeannett Senior, PA-C 10/03/16 2252    Charlesetta Shanks, MD 10/10/16 2047

## 2016-10-07 DIAGNOSIS — Z1231 Encounter for screening mammogram for malignant neoplasm of breast: Secondary | ICD-10-CM | POA: Diagnosis not present

## 2016-10-07 DIAGNOSIS — Z1272 Encounter for screening for malignant neoplasm of vagina: Secondary | ICD-10-CM | POA: Diagnosis not present

## 2016-10-07 DIAGNOSIS — Z90712 Acquired absence of cervix with remaining uterus: Secondary | ICD-10-CM | POA: Diagnosis not present

## 2016-10-07 DIAGNOSIS — Z6829 Body mass index (BMI) 29.0-29.9, adult: Secondary | ICD-10-CM | POA: Diagnosis not present

## 2016-10-07 DIAGNOSIS — Z124 Encounter for screening for malignant neoplasm of cervix: Secondary | ICD-10-CM | POA: Diagnosis not present

## 2016-10-15 DIAGNOSIS — N3010 Interstitial cystitis (chronic) without hematuria: Secondary | ICD-10-CM | POA: Diagnosis not present

## 2016-10-24 DIAGNOSIS — M5136 Other intervertebral disc degeneration, lumbar region: Secondary | ICD-10-CM | POA: Diagnosis not present

## 2016-10-24 DIAGNOSIS — G8929 Other chronic pain: Secondary | ICD-10-CM | POA: Diagnosis not present

## 2016-10-24 DIAGNOSIS — Z79891 Long term (current) use of opiate analgesic: Secondary | ICD-10-CM | POA: Diagnosis not present

## 2016-10-24 DIAGNOSIS — M961 Postlaminectomy syndrome, not elsewhere classified: Secondary | ICD-10-CM | POA: Diagnosis not present

## 2016-11-04 DIAGNOSIS — F419 Anxiety disorder, unspecified: Secondary | ICD-10-CM | POA: Diagnosis not present

## 2016-11-04 DIAGNOSIS — M79671 Pain in right foot: Secondary | ICD-10-CM | POA: Diagnosis not present

## 2016-11-04 DIAGNOSIS — Z6379 Other stressful life events affecting family and household: Secondary | ICD-10-CM | POA: Diagnosis not present

## 2016-11-04 DIAGNOSIS — G5600 Carpal tunnel syndrome, unspecified upper limb: Secondary | ICD-10-CM | POA: Diagnosis not present

## 2016-11-04 DIAGNOSIS — E785 Hyperlipidemia, unspecified: Secondary | ICD-10-CM | POA: Diagnosis not present

## 2016-11-26 DIAGNOSIS — G5601 Carpal tunnel syndrome, right upper limb: Secondary | ICD-10-CM | POA: Diagnosis not present

## 2016-12-02 ENCOUNTER — Encounter (HOSPITAL_COMMUNITY): Payer: Self-pay | Admitting: *Deleted

## 2016-12-02 ENCOUNTER — Emergency Department (HOSPITAL_COMMUNITY)
Admission: EM | Admit: 2016-12-02 | Discharge: 2016-12-02 | Disposition: A | Discharge status: Home / Self Care | Payer: Medicare HMO | Attending: Emergency Medicine | Admitting: Emergency Medicine

## 2016-12-02 ENCOUNTER — Emergency Department (HOSPITAL_COMMUNITY): Payer: Medicare HMO

## 2016-12-02 DIAGNOSIS — Y9389 Activity, other specified: Secondary | ICD-10-CM | POA: Insufficient documentation

## 2016-12-02 DIAGNOSIS — W19XXXA Unspecified fall, initial encounter: Secondary | ICD-10-CM | POA: Insufficient documentation

## 2016-12-02 DIAGNOSIS — S99911A Unspecified injury of right ankle, initial encounter: Secondary | ICD-10-CM | POA: Diagnosis present

## 2016-12-02 DIAGNOSIS — Y999 Unspecified external cause status: Secondary | ICD-10-CM | POA: Diagnosis not present

## 2016-12-02 DIAGNOSIS — S82831A Other fracture of upper and lower end of right fibula, initial encounter for closed fracture: Secondary | ICD-10-CM | POA: Diagnosis not present

## 2016-12-02 DIAGNOSIS — Z87891 Personal history of nicotine dependence: Secondary | ICD-10-CM | POA: Insufficient documentation

## 2016-12-02 DIAGNOSIS — Z79899 Other long term (current) drug therapy: Secondary | ICD-10-CM | POA: Diagnosis not present

## 2016-12-02 DIAGNOSIS — Z96643 Presence of artificial hip joint, bilateral: Secondary | ICD-10-CM | POA: Insufficient documentation

## 2016-12-02 DIAGNOSIS — Z7982 Long term (current) use of aspirin: Secondary | ICD-10-CM | POA: Diagnosis not present

## 2016-12-02 DIAGNOSIS — E119 Type 2 diabetes mellitus without complications: Secondary | ICD-10-CM | POA: Diagnosis not present

## 2016-12-02 DIAGNOSIS — Y9289 Other specified places as the place of occurrence of the external cause: Secondary | ICD-10-CM | POA: Diagnosis not present

## 2016-12-02 DIAGNOSIS — Z794 Long term (current) use of insulin: Secondary | ICD-10-CM | POA: Insufficient documentation

## 2016-12-02 DIAGNOSIS — Z91040 Latex allergy status: Secondary | ICD-10-CM | POA: Insufficient documentation

## 2016-12-02 DIAGNOSIS — S82491A Other fracture of shaft of right fibula, initial encounter for closed fracture: Secondary | ICD-10-CM | POA: Diagnosis not present

## 2016-12-02 DIAGNOSIS — J45909 Unspecified asthma, uncomplicated: Secondary | ICD-10-CM | POA: Insufficient documentation

## 2016-12-02 MED ORDER — HYDROMORPHONE HCL 1 MG/ML IJ SOLN
1.0000 mg | Freq: Once | INTRAMUSCULAR | Status: DC
  Filled 2016-12-02: qty 1

## 2016-12-02 NOTE — ED Notes (Signed)
Declined W/C at D/C and was escorted to lobby by RN. 

## 2016-12-02 NOTE — ED Triage Notes (Signed)
Pt reports hx of right foot drop, causing her to fall. Had recent fall, which increased pain and swelling to her right ankle.

## 2016-12-02 NOTE — ED Notes (Signed)
Patient transported to X-ray 

## 2016-12-02 NOTE — Consult Note (Signed)
Reason for Consult:Ankle fx Referring Physician: D Ray  Yolanda Yang is an 61 y.o. female.  HPI: Sabree fell while leaving the SN[facility] where her husband resides. Her right foot turned inward. She suffers from foot drop on that side stemming from back surgery. She also has frequent falls that have been increasing in frequency. She was able to ambulate immediately afterward but woke up yesterday with significantly more pain and swelling. She came in to the ED for evaluation today and x-rays showed a distal fibula fx. Orthopedic surgery was consulted.  Past Medical History:  Diagnosis Date  . Anxiety   . Asthma exacerbation    PULMOLOGIST-- DR RAMASWANY  . Bladder pain   . Borderline type 2 diabetes mellitus    DIET CONTROLLED  . Cerebral aneurysm, nonruptured monitored at [facility] by dr Anne Shutter   currently has x4 stable aneurysm's (x2 left side & x2 right side)--  2012 s/p coiling of 4 aneurysm's  . Chronic constipation   . Cystitis, interstitial   . Diabetes mellitus without complication ([place])   . GERD (gastroesophageal reflux disease)   . Headache syndrome    due to cerebral aneurysm's  . History of cerebral aneurysm repair    MULTIPLE  ANEURYSM--  S/P  COILING X4  ([facility])  . History of chronic sinusitis   . History of duodenal ulcer    2007  . History of gastric ulcer   . History of GI bleed    UPPER--  2007  AND 05-2011  . History of recurrent UTIs   . Hyperlipemia   . IBS (irritable bowel syndrome)   . Incomplete emptying of bladder   . OA (osteoarthritis)   . Right foot drop    POST LUMBAR SURGERY 2011  . Seasonal allergic rhinitis   . Urinary hesitancy   . Wears dentures    currently being fitted for full dentures  . Wears glasses     Past Surgical History:  Procedure Laterality Date  . ABDOMINAL HYSTERECTOMY  1997  . ANEURYSM COILING  05-08-2011     [facility]   INTRACRANIAL COILING X4  and Right Carotid stenting due to intraoperative injury  . ANTERIOR  CERVICAL DECOMP/DISCECTOMY FUSION  2002   C4 -- C7  . BREAST REDUCTION SURGERY Bilateral 06/09/2014   Procedure: BILATERAL MAMMARY REDUCTION  (BREAST);  Surgeon: Irene Limbo, MD;  Location: [place];  Service: Plastics;  Laterality: Bilateral;  . CYSTO/  HYDRODISTENTION/  INSTILLATION THERAPY  07-15-2006  . CYSTOSCOPY WITH HYDRODISTENSION AND BIOPSY N/A 09/08/2014   Procedure: CYSTOSCOPY/ HYDRODISTENSION OF BLADDER/ INSTILLATION OF MARCAINE AND PYRIDIUM;  Surgeon: Bjorn Loser, MD;  Location: [place];  Service: Urology;  Laterality: N/A;  . ESOPHAGOGASTRODUODENOSCOPY  05/17/2011   Procedure: ESOPHAGOGASTRODUODENOSCOPY (EGD);  Surgeon: Beryle Beams;  Location: [facility] ENDOSCOPY;  Service: Endoscopy;  Laterality: N/A;  . LEFT BREAST LUMPECTOMY  2000   benign  . NEGATIVE SLEEP STUDY  2011  PER PT  . POSTERIOR FUSION CERVICAL SPINE  2004   Revision C4 -- C7  . POSTERIOR LAMINECTOMY / DECOMPRESSION LUMBAR SPINE  05-17-2010   L3 -- L5  . ROTATOR CUFF REPAIR Right 2014  . TONSILLECTOMY  age 12  . TOTAL HIP ARTHROPLASTY Bilateral may & july 2013    Family History  Problem Relation Age of Onset  . Colon cancer Father   . Leukemia Father     Social History:  reports that she quit smoking about 5 years ago. Her smoking  use included Cigarettes. She has a 10.00 pack-year smoking history. She has never used smokeless tobacco. She reports that she does not drink alcohol or use drugs.  Allergies:  Allergies  Allergen Reactions  . Erythromycin Other (See Comments)    "Heart palpitation"  . Latex Rash    Medications: I have reviewed the patient's current medications.  No results found for this or any previous visit (from the past 48 hour(s)).  Dg Ankle Complete Right  Result Date: 12/02/2016 CLINICAL DATA:  61 year old female with right foot drop causing fall. Initial encounter. EXAM: RIGHT ANKLE - COMPLETE 3+ VIEW COMPARISON:  None. FINDINGS: Transverse fracture of the  distal right fibula with adjacent soft tissue swelling. Tiny fracture fragment directed laterally. Ankle mortise slightly widened laterally. Minimal sclerosis base of the third and fourth metatarsal without clear acute fracture. Plantar spur.  Small spur Achilles tendon insertion site. Mild degenerative changes talonavicular articulation. IMPRESSION: Transverse fracture of the distal right fibula with adjacent soft tissue swelling. Tiny fracture fragment directed laterally. Ankle mortise slightly widened laterally. Minimal sclerosis base of the third and fourth metatarsal without acute fracture. Electronically Signed   By: Genia Del M.D.   On: 12/02/2016 09:40    Review of Systems  Constitutional: Negative for weight loss.  HENT: Negative for ear discharge, ear pain, hearing loss and tinnitus.   Eyes: Negative for blurred vision, double vision, photophobia and pain.  Respiratory: Negative for cough, sputum production and shortness of breath.   Cardiovascular: Negative for chest pain.  Gastrointestinal: Negative for abdominal pain, nausea and vomiting.  Genitourinary: Negative for dysuria, flank pain, frequency and urgency.  Musculoskeletal: Positive for falls and joint pain (Right ankle). Negative for back pain, myalgias and neck pain.  Neurological: Negative for dizziness, tingling, sensory change, focal weakness, loss of consciousness and headaches.  Endo/Heme/Allergies: Does not bruise/bleed easily.  Psychiatric/Behavioral: Negative for depression, memory loss and substance abuse. The patient is not nervous/anxious.    Blood pressure (!) 107/59, pulse 77, temperature 98.4 F (36.9 C), temperature source Oral, resp. rate 18, SpO2 97 %. Physical Exam  Constitutional: She appears well-developed and well-nourished. No distress.  HENT:  Head: Normocephalic.  Eyes: Conjunctivae are normal. Right eye exhibits no discharge. Left eye exhibits no discharge. No scleral icterus.  Cardiovascular:  Normal rate and regular rhythm.   Respiratory: Effort normal. No respiratory distress.  Musculoskeletal:  RLE No traumatic wounds, ecchymosis, or rash  TTP lateral malleolus, mild edema  No effusions  Knee stable to varus/ valgus and anterior/posterior stress  Sens DPN, SPN, TN hypesthetic  Motor EHL, ext 0/5, flex, evers 2/5  DP 2+, PT 2+     Neurological: She is alert.  Skin: Skin is warm and dry. She is not diaphoretic.  Psychiatric: She has a normal mood and affect. Her behavior is normal.    Assessment/Plan: Fall Right distal fibula fx -- Should do fine non-operatively in splint and NWB. Will need to f/u with Dr. Lyla Glassing in 1 week in office.    Yolanda Abu, PA-C Orthopedic Surgery [phone] 12/02/2016, 11:36 AM

## 2016-12-02 NOTE — ED Provider Notes (Signed)
[place] DEPT Provider Note   CSN: [id number] Arrival date & time: 12/02/16  3976  By signing my name below, I, Mayer Masker, attest that this documentation has been prepared under the direction and in the presence of Janetta Hora, PA-C. Electronically Signed: Mayer Masker, Scribe. 12/02/16. 10:19 AM.  History   Chief Complaint Chief Complaint  Patient presents with  . Fall  . Ankle Pain   The history is provided by the patient. No language interpreter was used.   HPI Comments: Yolanda Yang is a 61 y.o. female with PSHx of back surgery and h/o foot drop who presents to the Emergency Department complaining of constant, severe right-sided foot pain s/p an inversion injury that occurred two nights ago. She has associated swelling in the area. She notes she has fallen four times in the last month. She has wrapped it with a bandage with some relief to the swelling. She is unable to apply pressure or walk on the foot. She wears a brace for this foot. Pt suffers from chronic pain and takes 4-6mg  PO Dilaudid prn and Morphine BID  Past Medical History:  Diagnosis Date  . Anxiety   . Asthma exacerbation    PULMOLOGIST-- DR RAMASWANY  . Bladder pain   . Borderline type 2 diabetes mellitus    DIET CONTROLLED  . Cerebral aneurysm, nonruptured monitored at [facility] by dr Anne Shutter   currently has x4 stable aneurysm's (x2 left side & x2 right side)--  2012 s/p coiling of 4 aneurysm's  . Chronic constipation   . Cystitis, interstitial   . Diabetes mellitus without complication ([place])   . GERD (gastroesophageal reflux disease)   . Headache syndrome    due to cerebral aneurysm's  . History of cerebral aneurysm repair    MULTIPLE  ANEURYSM--  S/P  COILING X4  ([facility])  . History of chronic sinusitis   . History of duodenal ulcer    2007  . History of gastric ulcer   . History of GI bleed    UPPER--  2007  AND 05-2011  . History of recurrent UTIs   . Hyperlipemia   . IBS  (irritable bowel syndrome)   . Incomplete emptying of bladder   . OA (osteoarthritis)   . Right foot drop    POST LUMBAR SURGERY 2011  . Seasonal allergic rhinitis   . Urinary hesitancy   . Wears dentures    currently being fitted for full dentures  . Wears glasses     Patient Active Problem List   Diagnosis Date Noted  . Macromastia 06/09/2014  . Acute sinusitis 05/19/2014  . Asthma with acute exacerbation 05/19/2014  . Dyspnea 04/02/2013  . Status post angioplasty with stent 05/16/2011  . Hypotension due to blood loss 05/16/2011  . Tobacco abuse 05/16/2011  . Leukocytosis 05/16/2011  . Acute GI bleeding 05/15/2011  . Anemia associated with acute blood loss 05/15/2011  . HYPERLIPIDEMIA 12/15/2006  . DEPENDENCE, DRUG NOS, CONTINUOUS 12/15/2006  . ALLERGIC RHINITIS 12/15/2006  . Asthma 12/15/2006  . GERD 12/15/2006  . CYSTITIS, CHRONIC INTERSTITIAL 12/15/2006  . FIBROADENOSIS, BREAST 12/15/2006  . ENDOMETRIOSIS NOS 12/15/2006  . STATE, SYMPTOMATIC MENOPAUSE/FEM CLIMACTERIC 12/15/2006  . LOW BACK PAIN, CHRONIC 12/15/2006  . History of duodenal ulcer 12/15/2006  . LUMPECTOMY, BREAST, HX OF 12/15/2006  . HYSTERECTOMY, HX OF 12/15/2006    Past Surgical History:  Procedure Laterality Date  . ABDOMINAL HYSTERECTOMY  1997  . ANEURYSM COILING  05-08-2011     [facility]  INTRACRANIAL COILING X4  and Right Carotid stenting due to intraoperative injury  . ANTERIOR CERVICAL DECOMP/DISCECTOMY FUSION  2002   C4 -- C7  . BREAST REDUCTION SURGERY Bilateral 06/09/2014   Procedure: BILATERAL MAMMARY REDUCTION  (BREAST);  Surgeon: Irene Limbo, MD;  Location: [place];  Service: Plastics;  Laterality: Bilateral;  . CYSTO/  HYDRODISTENTION/  INSTILLATION THERAPY  07-15-2006  . CYSTOSCOPY WITH HYDRODISTENSION AND BIOPSY N/A 09/08/2014   Procedure: CYSTOSCOPY/ HYDRODISTENSION OF BLADDER/ INSTILLATION OF MARCAINE AND PYRIDIUM;  Surgeon: Bjorn Loser, MD;  Location: [place];  Service: Urology;  Laterality: N/A;  . ESOPHAGOGASTRODUODENOSCOPY  05/17/2011   Procedure: ESOPHAGOGASTRODUODENOSCOPY (EGD);  Surgeon: Beryle Beams;  Location: [facility] ENDOSCOPY;  Service: Endoscopy;  Laterality: N/A;  . LEFT BREAST LUMPECTOMY  2000   benign  . NEGATIVE SLEEP STUDY  2011  PER PT  . POSTERIOR FUSION CERVICAL SPINE  2004   Revision C4 -- C7  . POSTERIOR LAMINECTOMY / DECOMPRESSION LUMBAR SPINE  05-17-2010   L3 -- L5  . ROTATOR CUFF REPAIR Right 2014  . TONSILLECTOMY  age 58  . TOTAL HIP ARTHROPLASTY Bilateral may & july 2013    OB History    No data available       Home Medications    Prior to Admission medications   Medication Sig Start Date End Date Taking? Authorizing Provider  acetaminophen (TYLENOL) 650 MG CR tablet Take 1,300 mg by mouth every 8 (eight) hours as needed for pain.    [provider]  albuterol (PROVENTIL) (2.5 MG/3ML) 0.083% nebulizer solution INHALE THE CONTENTS OF 1 VIAL IN NEBULIZER EVERY 12 HOURS AS NEEDED 08/07/15   Brand Males, MD  aspirin 81 MG chewable tablet Chew 81 mg by mouth daily.     [provider]  azithromycin (ZITHROMAX Z-PAK) 250 MG tablet Take 2 pills today, then 1 pill daily until gone. Patient not taking: Reported on 10/03/2016 08/15/15   Melony Overly, MD  beclomethasone (QVAR) 80 MCG/ACT inhaler Inhale 2 puffs into the lungs 2 (two) times daily. 08/29/15   Brand Males, MD  butalbital-acetaminophen-caffeine (FIORICET, ESGIC) [id number] MG per tablet Take 1 tablet by mouth daily as needed for headache.     [provider]  clindamycin (CLEOCIN) 300 MG capsule Take 300 mg by mouth daily as needed (for flare up).     [provider]  cyclobenzaprine (FLEXERIL) 10 MG tablet Take 10 mg by mouth 3 (three) times daily as needed for muscle spasms.     [provider]  diazepam (VALIUM) 5 MG tablet Take 5 mg by mouth every 6 (six) hours as needed for anxiety.    [provider]  dicyclomine (BENTYL) 20 MG tablet Take 1 tablet (20 mg total) by mouth 2 (two) times daily. 10/03/16   Kirichenko, Lahoma Rocker, PA-C  Diphenhyd-Hydrocort-Nystatin (FIRST-DUKES MOUTHWASH) SUSP Use as directed 5 mLs in the mouth or throat 3 (three) times daily as needed. Patient not taking: Reported on 10/03/2016 06/09/15   Parrett, Fonnie Mu, NP  DULoxetine (CYMBALTA) 60 MG capsule Take 60 mg by mouth every evening.     [provider]  fexofenadine (ALLEGRA) 180 MG tablet Take 180 mg by mouth daily as needed for allergies or rhinitis.    [provider]  Fluticasone-Salmeterol (ADVAIR DISKUS) 250-50 MCG/DOSE AEPB Inhale 1 puff into the lungs 2 (two) times daily. 12/04/15   Brand Males, MD  gabapentin (NEURONTIN) 600 MG tablet Take 600 mg by mouth  3 (three) times daily.    [provider]  HYDROmorphone (DILAUDID) 4 MG tablet Take 1-1.5 tablets (4-6 mg total) by mouth every 4 (four) hours as needed for severe pain. 06/10/14   Irene Limbo, MD  Linaclotide (LINZESS) 290 MCG CAPS capsule Take 290 mcg by mouth every evening.     [provider]  Meth-Hyo-M Bl-Na Phos-Ph Sal (URIBEL) 118 MG CAPS Take 2 capsules by mouth 2 (two) times daily as needed (interstitial cystitis).    [provider]  nitrofurantoin, macrocrystal-monohydrate, (MACROBID) 100 MG capsule Take 100 mg by mouth daily as needed (flare up).     [provider]  nystatin cream (MYCOSTATIN) Apply 1 application topically at bedtime. PT USES PRN QHS WHEN ON ANTIBIOTICS TO PREVENT YEAST INFECTION    [provider]  omeprazole (PRILOSEC) 40 MG capsule Take 40 mg by mouth daily.    [provider]  pantoprazole (PROTONIX) 40 MG tablet Take 1 tablet (40 mg total) by mouth 2 (two) times daily before a meal. Patient not taking: Reported on 10/03/2016 05/20/11   Hosie Poisson, MD  pentosan polysulfate (ELMIRON) 100 MG capsule Take 100 mg by mouth 2 (two) times  daily.     [provider]  potassium chloride (K-DUR) 10 MEQ tablet Take 40mg  twice a day tomorrow, and then 20mg  twice a day for two more days 10/03/16   Kirichenko, Lahoma Rocker, PA-C  Probiotic Product (PROBIOTIC DAILY PO) Take 1 capsule by mouth every evening.    [provider]  promethazine (PHENERGAN) 12.5 MG tablet Take 12.5 mg by mouth every 6 (six) hours as needed for nausea.    [provider]  ranitidine (ZANTAC) 150 MG capsule Take 1 capsule (150 mg total) by mouth 2 (two) times daily. Patient not taking: Reported on 10/03/2016 08/15/15   Melony Overly, MD  rosuvastatin (CRESTOR) 10 MG tablet Take 10 mg by mouth every evening.     [provider]  SPIRIVA HANDIHALER 18 MCG inhalation capsule INHALE THE CONTENTS OF 1 CAPSULE EVERY DAY 07/05/16   Brand Males, MD  sucralfate (CARAFATE) 1 GM/10ML suspension Take 10 mLs (1 g total) by mouth 4 (four) times daily -  with meals and at bedtime. Patient not taking: Reported on 10/03/2016 08/15/15   Melony Overly, MD  VENTOLIN HFA 108 (90 Base) MCG/ACT inhaler INHALE 2 PUFFS INTO THE LUNGS EVERY 4 HOURS AS NEEDED FOR WHEEZING OR SHORTNESS OF BREATH. NEED OFFICE VISIT 08/28/16   Brand Males, MD    Family History Family History  Problem Relation Age of Onset  . Colon cancer Father   . Leukemia Father     Social History Social History  Substance Use Topics  . Smoking status: Former Smoker    Packs/day: 0.50    Years: 20.00    Types: Cigarettes    Quit date: 08/02/2011  . Smokeless tobacco: Never Used  . Alcohol use No     Allergies   Erythromycin and Latex   Review of Systems Review of Systems  Constitutional: Negative for fever.  Musculoskeletal: Positive for arthralgias and joint swelling.  All other systems reviewed and are negative.    Physical Exam Updated Vital Signs BP (!) 107/59 (BP Location: Left Arm)   Pulse 77   Temp 98.4 F (36.9 C) (Oral)   Resp 18   SpO2 97%   Physical  Exam  Constitutional: She is oriented to person, place, and time. She appears well-developed and well-nourished. No distress.  HENT:  Head: Normocephalic and atraumatic.  Eyes: Conjunctivae are normal. Pupils are equal, round, and reactive to light. Right eye exhibits no discharge. Left eye exhibits no discharge. No scleral icterus.  Neck: Normal range of motion.  Cardiovascular: Normal rate.   Pulmonary/Chest: Effort normal. No respiratory distress.  Abdominal: She exhibits no distension.  Musculoskeletal:  R ankle and foot: Swelling along lateral aspect of ankle with TTP. Able to wiggle toes. No calf tenderness. N/V intact.  Neurological: She is alert and oriented to person, place, and time.  neurovascularly intact  Skin: Skin is warm and dry.  Psychiatric: She has a normal mood and affect. Her behavior is normal.  Nursing note and vitals reviewed.    ED Treatments / Results  DIAGNOSTIC STUDIES: Oxygen Saturation is 97% on RA, normal by my interpretation.    COORDINATION OF CARE: 10:16 AM Discussed treatment plan with pt at bedside and pt agreed to plan.  Labs (all labs ordered are listed, but only abnormal results are displayed) Labs Reviewed - No data to display  EKG  EKG Interpretation None       Radiology No results found.  Procedures Procedures (including critical care time)  SPLINT APPLICATION Date/Time: 09/03/55, 12:30PM Authorized by: Recardo Evangelist Consent: Verbal consent obtained. Risks and benefits: risks, benefits and alternatives were discussed Consent given by: patient Splint applied by: orthopedic technician Location details: right lower extremity Splint type: posterior short leg Supplies used: fiberglass Post-procedure: The splinted body part was neurovascularly unchanged following the procedure. Patient tolerance: Patient tolerated the procedure well with no immediate complications.     Medications Ordered in ED Medications - No data to  display   Initial Impression / Assessment and Plan / ED Course  I have reviewed the triage vital signs and the nursing notes.  Pertinent labs & imaging results that were available during my care of the patient were reviewed by me and considered in my medical decision making (see chart for details).  61 year old female with right distal fibula fracture with widening of ankle mortise. Spoke with Jacqulynn Cadet PA-C who came to evaluate patient in the ED as well. Please see his separate note for details. Pt is to be NWB and follow up with Dr. Lyla Glassing. Splint was applied and crutches were given. She already had large amount of pain medicine at home. Return precautions given.  Final Clinical Impressions(s) / ED Diagnoses   Final diagnoses:  Closed fracture of distal end of right fibula, unspecified fracture morphology, initial encounter  Fall, initial encounter    New Prescriptions New Prescriptions   No medications on file   I personally performed the services described in this documentation, which was scribed in my presence. The recorded information has been reviewed and is accurate.     Recardo Evangelist, PA-C 12/04/16 1223    Pattricia Boss, MD 12/08/16 [phone]

## 2016-12-02 NOTE — Progress Notes (Signed)
Orthopedic Tech Progress Note Patient Details:  Yolanda Yang 15-Jan-1956 [id number]  Ortho Devices Type of Ortho Device: Crutches, Short leg splint Ortho Device/Splint Location: applied short leg splint to pt right foo/ankle. pt has a history of foot drop and had difficulty ambulation before current injury.  pt tolerated application of splint very well.  trained pt for crutch use pt ambulated fair.  pt stated she has several issues that will prevent her from ambulating well.  (right foot/ankle) Ortho Device/Splint Interventions: Application, Adjustment   Kristopher Oppenheim 12/02/2016, 12:44 PM

## 2016-12-02 NOTE — Discharge Instructions (Addendum)
Keep splint intact and dry.  Do not put weight down on right leg.  Keep leg elevated when lying or sitting.  No driving.

## 2016-12-04 ENCOUNTER — Ambulatory Visit: Payer: Medicare HMO | Admitting: Podiatry

## 2016-12-11 DIAGNOSIS — S82831A Other fracture of upper and lower end of right fibula, initial encounter for closed fracture: Secondary | ICD-10-CM | POA: Diagnosis not present

## 2016-12-12 DIAGNOSIS — S82831A Other fracture of upper and lower end of right fibula, initial encounter for closed fracture: Secondary | ICD-10-CM | POA: Diagnosis not present

## 2016-12-16 DIAGNOSIS — G894 Chronic pain syndrome: Secondary | ICD-10-CM | POA: Diagnosis not present

## 2016-12-16 DIAGNOSIS — G4440 Drug-induced headache, not elsewhere classified, not intractable: Secondary | ICD-10-CM | POA: Diagnosis not present

## 2016-12-23 DIAGNOSIS — M961 Postlaminectomy syndrome, not elsewhere classified: Secondary | ICD-10-CM | POA: Diagnosis not present

## 2016-12-23 DIAGNOSIS — M5136 Other intervertebral disc degeneration, lumbar region: Secondary | ICD-10-CM | POA: Diagnosis not present

## 2016-12-23 DIAGNOSIS — Z79891 Long term (current) use of opiate analgesic: Secondary | ICD-10-CM | POA: Diagnosis not present

## 2016-12-23 DIAGNOSIS — G894 Chronic pain syndrome: Secondary | ICD-10-CM | POA: Diagnosis not present

## 2017-01-15 ENCOUNTER — Other Ambulatory Visit: Payer: Self-pay | Admitting: Internal Medicine

## 2017-01-22 ENCOUNTER — Encounter: Payer: Self-pay | Admitting: Podiatry

## 2017-01-22 ENCOUNTER — Ambulatory Visit (INDEPENDENT_AMBULATORY_CARE_PROVIDER_SITE_OTHER): Payer: Medicare HMO | Admitting: Podiatry

## 2017-01-22 ENCOUNTER — Ambulatory Visit (INDEPENDENT_AMBULATORY_CARE_PROVIDER_SITE_OTHER): Payer: Medicare HMO

## 2017-01-22 VITALS — BP 101/67 | HR 75 | Resp 18

## 2017-01-22 DIAGNOSIS — S82831A Other fracture of upper and lower end of right fibula, initial encounter for closed fracture: Secondary | ICD-10-CM

## 2017-01-22 DIAGNOSIS — M21371 Foot drop, right foot: Secondary | ICD-10-CM

## 2017-01-22 DIAGNOSIS — S92301A Fracture of unspecified metatarsal bone(s), right foot, initial encounter for closed fracture: Secondary | ICD-10-CM

## 2017-01-22 NOTE — Progress Notes (Signed)
   Subjective:    Patient ID: Yolanda Yang, female    DOB: 1955-07-15, 61 y.o.   MRN: [id number]  HPI This patient presents today for 2 concerns. Her first concern that she still has persistent lateral right ankle pain after wearing a cast in a boot for 6 weeks for a diagnosis of right ankle fracture. Patient states she said no follow-up for the right ankle fracture. She says she is compliant with initial instructions and wore the boot for 6 weeks  She is also complaining of an ongoing right dropfoot which she wears a over-the-counter dropfoot brace and complains of pain and irritation on the plantar lateral aspect of the right foot when walking and wearing shoes with a dropfoot the irritation on the lateral plantar as the right foot is become progressively more comfortable over the last 6 weeks after the ankle fracture. The right dropfoot is associated with postoperative complication from back surgery   Review of Systems  Skin:       I have a callus on the right foot   All other systems reviewed and are negative.      Objective:   Physical Exam  Orientated 3  Vascular: No calf edema or calf tenderness bilaterally Low-grade edema of right ankle nonpitting DP and PT pulses 2/4 bilaterally Capillary reflex immediate bilaterally  Neurological: Sensation to 10 g monofilament wire intact 5/9 right and 7/9 left Vibratory sensation reactive bilaterally Ankle reflexes reactive bilaterally  Musculoskeletal: Manual motor testing: Dorsi flexion 0/5 right and 5/5 left Plantar flexion 5/5 bilaterally Patient gait demonstrates right foot drop with plantar flexion and inversion during the swing phase  Tenderness lateral right ankle without crepitus or pain upon range of motion  X-ray examination right ankle dated 01/24/2016 Transverse complete fracture distal right fibula that penetrates into the joint space. There is no displacement Ankle mortise intact  Radiographic  impression: Complete transverse fracture distal fibula right without displacement      Assessment & Plan:    Assessment: Transverse fracture distal right fibula Right dropfoot  Plan: I reviewed the results of the exam with patient today and informed her that the right ankle fracture had not healed. I instructed her to resume wearing the boot on the right foot at least an additional 6 weeks Schedule for orthotist to prescribed a custom right AFO for the indication of: Right dropfoot. Also after AFO dispensed could be follow-up treatment for the lateral right ankle fracture  Schedule appointment with orthopedist ASAP for AFO right

## 2017-01-22 NOTE — Patient Instructions (Signed)
Continue wearing boot on right foot as fraction right ankle is not healed

## 2017-01-30 ENCOUNTER — Ambulatory Visit: Payer: Medicare HMO | Admitting: Orthotics

## 2017-01-30 DIAGNOSIS — S82831A Other fracture of upper and lower end of right fibula, initial encounter for closed fracture: Secondary | ICD-10-CM

## 2017-01-30 DIAGNOSIS — M21371 Foot drop, right foot: Secondary | ICD-10-CM

## 2017-01-30 DIAGNOSIS — S92301A Fracture of unspecified metatarsal bone(s), right foot, initial encounter for closed fracture: Secondary | ICD-10-CM

## 2017-01-30 NOTE — Progress Notes (Signed)
Patient came in today to be evaluated/cast for custom AFO per Dr Phoebe Perch request.  Patient has significant drop foot right, and hx of ankle instability, fx right.  Patient very concerned about brace not being too "bulky" to fit in shoe as she has been using "foot up" dorsi assist.   Patient has weakened dorsiflexors rt, as well she as difficulty inverting foot.  2/5 strength all planes.  [place]ze brace chosen with tammarack dorsi assist joints.  Patient tolerated casting procedure well.  Patient advised that Medicaid will not pay the 20% and will be her responsibility.  She asked if she could make payments.

## 2017-02-20 DIAGNOSIS — G8929 Other chronic pain: Secondary | ICD-10-CM | POA: Diagnosis not present

## 2017-02-20 DIAGNOSIS — M545 Low back pain: Secondary | ICD-10-CM | POA: Diagnosis not present

## 2017-02-20 DIAGNOSIS — Z79899 Other long term (current) drug therapy: Secondary | ICD-10-CM | POA: Diagnosis not present

## 2017-02-27 ENCOUNTER — Other Ambulatory Visit: Payer: Medicare HMO | Admitting: Orthotics

## 2017-03-06 ENCOUNTER — Ambulatory Visit (INDEPENDENT_AMBULATORY_CARE_PROVIDER_SITE_OTHER): Payer: Medicare HMO | Admitting: Orthotics

## 2017-03-06 DIAGNOSIS — S82831A Other fracture of upper and lower end of right fibula, initial encounter for closed fracture: Secondary | ICD-10-CM | POA: Diagnosis not present

## 2017-03-06 DIAGNOSIS — M21371 Foot drop, right foot: Secondary | ICD-10-CM

## 2017-03-06 NOTE — Progress Notes (Signed)
Patient  came in to pick up [place] brace w/ dorsi assist springs.  The brace fit well and immediately patient's  gait approved.  The brace provided desired m-l stability in frontal/transverse planes and aided in dorsiflexion in saggital plane. Patient was able to don and doff brace with minimal difficulty.  Overall patient pleased with fit and functionality of brace.  

## 2017-03-12 DIAGNOSIS — G8929 Other chronic pain: Secondary | ICD-10-CM | POA: Diagnosis not present

## 2017-03-12 DIAGNOSIS — Z79899 Other long term (current) drug therapy: Secondary | ICD-10-CM | POA: Diagnosis not present

## 2017-03-12 DIAGNOSIS — M545 Low back pain: Secondary | ICD-10-CM | POA: Diagnosis not present

## 2017-03-26 DIAGNOSIS — G8929 Other chronic pain: Secondary | ICD-10-CM | POA: Diagnosis not present

## 2017-03-26 DIAGNOSIS — M545 Low back pain: Secondary | ICD-10-CM | POA: Diagnosis not present

## 2017-03-26 DIAGNOSIS — Z79899 Other long term (current) drug therapy: Secondary | ICD-10-CM | POA: Diagnosis not present

## 2017-04-21 DIAGNOSIS — Z79899 Other long term (current) drug therapy: Secondary | ICD-10-CM | POA: Diagnosis not present

## 2017-04-21 DIAGNOSIS — G8929 Other chronic pain: Secondary | ICD-10-CM | POA: Diagnosis not present

## 2017-04-21 DIAGNOSIS — M545 Low back pain: Secondary | ICD-10-CM | POA: Diagnosis not present

## 2017-04-22 DIAGNOSIS — N898 Other specified noninflammatory disorders of vagina: Secondary | ICD-10-CM | POA: Diagnosis not present

## 2017-04-22 DIAGNOSIS — G5600 Carpal tunnel syndrome, unspecified upper limb: Secondary | ICD-10-CM | POA: Diagnosis not present

## 2017-04-22 DIAGNOSIS — J449 Chronic obstructive pulmonary disease, unspecified: Secondary | ICD-10-CM | POA: Diagnosis not present

## 2017-04-23 DIAGNOSIS — I10 Essential (primary) hypertension: Secondary | ICD-10-CM | POA: Diagnosis not present

## 2017-04-23 DIAGNOSIS — E785 Hyperlipidemia, unspecified: Secondary | ICD-10-CM | POA: Diagnosis not present

## 2017-04-23 DIAGNOSIS — M7542 Impingement syndrome of left shoulder: Secondary | ICD-10-CM | POA: Diagnosis not present

## 2017-04-23 DIAGNOSIS — J45909 Unspecified asthma, uncomplicated: Secondary | ICD-10-CM | POA: Diagnosis not present

## 2017-04-23 DIAGNOSIS — E7800 Pure hypercholesterolemia, unspecified: Secondary | ICD-10-CM | POA: Diagnosis not present

## 2017-04-23 DIAGNOSIS — M7541 Impingement syndrome of right shoulder: Secondary | ICD-10-CM | POA: Diagnosis not present

## 2017-04-23 DIAGNOSIS — G894 Chronic pain syndrome: Secondary | ICD-10-CM | POA: Diagnosis not present

## 2017-04-23 DIAGNOSIS — M7500 Adhesive capsulitis of unspecified shoulder: Secondary | ICD-10-CM | POA: Diagnosis not present

## 2017-04-23 DIAGNOSIS — Z981 Arthrodesis status: Secondary | ICD-10-CM | POA: Diagnosis not present

## 2017-04-28 DIAGNOSIS — G5601 Carpal tunnel syndrome, right upper limb: Secondary | ICD-10-CM | POA: Diagnosis not present

## 2017-04-28 DIAGNOSIS — G5611 Other lesions of median nerve, right upper limb: Secondary | ICD-10-CM | POA: Diagnosis not present

## 2017-04-30 ENCOUNTER — Other Ambulatory Visit: Payer: Self-pay | Admitting: Nurse Practitioner

## 2017-04-30 ENCOUNTER — Other Ambulatory Visit: Payer: Self-pay | Admitting: Internal Medicine

## 2017-05-05 ENCOUNTER — Other Ambulatory Visit: Payer: Self-pay | Admitting: Physician Assistant

## 2017-05-05 DIAGNOSIS — M25512 Pain in left shoulder: Secondary | ICD-10-CM

## 2017-05-11 ENCOUNTER — Other Ambulatory Visit: Payer: Self-pay

## 2017-05-21 DIAGNOSIS — Z79899 Other long term (current) drug therapy: Secondary | ICD-10-CM | POA: Diagnosis not present

## 2017-05-24 ENCOUNTER — Other Ambulatory Visit: Payer: Self-pay | Admitting: Internal Medicine

## 2017-05-26 ENCOUNTER — Encounter: Payer: Self-pay | Admitting: Podiatry

## 2017-05-26 ENCOUNTER — Ambulatory Visit (INDEPENDENT_AMBULATORY_CARE_PROVIDER_SITE_OTHER): Payer: Medicare HMO | Admitting: Podiatry

## 2017-05-26 DIAGNOSIS — Q663 Other congenital varus deformities of feet, unspecified foot: Secondary | ICD-10-CM

## 2017-05-26 NOTE — Patient Instructions (Signed)
Purchase a larger shoe to accommodate the AFO When purchasing shoes present to the store later in the morning or early afternoon with the thickness a sock you know it would wear Attach felt pad to the area on the bottom of the right foot as we discussed in the office

## 2017-05-27 DIAGNOSIS — Z87891 Personal history of nicotine dependence: Secondary | ICD-10-CM | POA: Diagnosis not present

## 2017-05-27 DIAGNOSIS — I10 Essential (primary) hypertension: Secondary | ICD-10-CM | POA: Diagnosis not present

## 2017-05-27 DIAGNOSIS — E7800 Pure hypercholesterolemia, unspecified: Secondary | ICD-10-CM | POA: Diagnosis not present

## 2017-05-27 DIAGNOSIS — Z79899 Other long term (current) drug therapy: Secondary | ICD-10-CM | POA: Diagnosis not present

## 2017-05-27 DIAGNOSIS — J45909 Unspecified asthma, uncomplicated: Secondary | ICD-10-CM | POA: Diagnosis not present

## 2017-05-27 DIAGNOSIS — E785 Hyperlipidemia, unspecified: Secondary | ICD-10-CM | POA: Diagnosis not present

## 2017-05-27 DIAGNOSIS — Z7982 Long term (current) use of aspirin: Secondary | ICD-10-CM | POA: Diagnosis not present

## 2017-05-27 DIAGNOSIS — G5601 Carpal tunnel syndrome, right upper limb: Secondary | ICD-10-CM | POA: Diagnosis not present

## 2017-05-27 DIAGNOSIS — Z7951 Long term (current) use of inhaled steroids: Secondary | ICD-10-CM | POA: Diagnosis not present

## 2017-05-27 NOTE — Progress Notes (Signed)
Patient ID: Yolanda Yang, female   DOB: 1955-09-12, 61 y.o.   MRN: [id number]   Subjective: This patient presents today complaining of pain on the plantar right fifth MPJ area when standing and walking gradually increasing since the visit of 01/22/2017. Patient was last evaluated for a fracture distal right fibula on 01/24/2016 and a AFO right was prescribed to treat the dropfoot as well as stabilize fracture. This time patient states that ankle is comfortable, however, patient has discomfort in the plantar fifth right MPJ. Patient admits that she wears existing size shoes and remove the insole in the shoes to accommodate AFO  Objective: Orientated 3 No calf edema calf tenderness bilaterally No edema right ankle DP and PT pulses 2/4 bilaterally Reflex within normals bilaterally Sensation to 10 g monofilament wire diminished for noted previously Vibratory sensation reactive bilaterally Dorsi flexion right 0/5 and 5/5 left Plantar flexion 5/5 bilaterally Evidence of previous callus sub-fifth MPJ the patient has self treated with callus remover. There is no obvious callus at this time I palpable tenderness plantar fifth right MPJ without any palpable lesions  Assessment: Dropfoot deformity right Varus position of foot so she would dropfoot flexible Inadequate shoeing as patient is removed soft insole and shoe to accommodate AFO  Plan: Patient instructed to replace shoes adequate in size to accommodate AFO in thickness of sock that she wears Additional felt pads dispensed for patient to attach insole of shoe to further offload the plantar fifth right MPJ  Reappoint at patient's request

## 2017-06-05 DIAGNOSIS — J449 Chronic obstructive pulmonary disease, unspecified: Secondary | ICD-10-CM | POA: Diagnosis not present

## 2017-06-05 DIAGNOSIS — Z87891 Personal history of nicotine dependence: Secondary | ICD-10-CM | POA: Diagnosis not present

## 2017-06-05 DIAGNOSIS — E785 Hyperlipidemia, unspecified: Secondary | ICD-10-CM | POA: Diagnosis not present

## 2017-06-05 DIAGNOSIS — Z7982 Long term (current) use of aspirin: Secondary | ICD-10-CM | POA: Diagnosis not present

## 2017-06-05 DIAGNOSIS — K219 Gastro-esophageal reflux disease without esophagitis: Secondary | ICD-10-CM | POA: Diagnosis not present

## 2017-06-05 DIAGNOSIS — Z881 Allergy status to other antibiotic agents status: Secondary | ICD-10-CM | POA: Diagnosis not present

## 2017-06-05 DIAGNOSIS — I10 Essential (primary) hypertension: Secondary | ICD-10-CM | POA: Diagnosis not present

## 2017-06-05 DIAGNOSIS — G5601 Carpal tunnel syndrome, right upper limb: Secondary | ICD-10-CM | POA: Diagnosis not present

## 2017-06-05 DIAGNOSIS — Z79899 Other long term (current) drug therapy: Secondary | ICD-10-CM | POA: Diagnosis not present

## 2017-06-11 DIAGNOSIS — Z7982 Long term (current) use of aspirin: Secondary | ICD-10-CM | POA: Diagnosis not present

## 2017-06-11 DIAGNOSIS — E785 Hyperlipidemia, unspecified: Secondary | ICD-10-CM | POA: Diagnosis not present

## 2017-06-11 DIAGNOSIS — Z79899 Other long term (current) drug therapy: Secondary | ICD-10-CM | POA: Diagnosis not present

## 2017-06-11 DIAGNOSIS — K219 Gastro-esophageal reflux disease without esophagitis: Secondary | ICD-10-CM | POA: Diagnosis not present

## 2017-06-11 DIAGNOSIS — I10 Essential (primary) hypertension: Secondary | ICD-10-CM | POA: Diagnosis not present

## 2017-06-11 DIAGNOSIS — G5601 Carpal tunnel syndrome, right upper limb: Secondary | ICD-10-CM | POA: Diagnosis not present

## 2017-06-11 DIAGNOSIS — Z87891 Personal history of nicotine dependence: Secondary | ICD-10-CM | POA: Diagnosis not present

## 2017-06-11 DIAGNOSIS — Z881 Allergy status to other antibiotic agents status: Secondary | ICD-10-CM | POA: Diagnosis not present

## 2017-06-11 DIAGNOSIS — J449 Chronic obstructive pulmonary disease, unspecified: Secondary | ICD-10-CM | POA: Diagnosis not present

## 2017-06-18 DIAGNOSIS — Z79899 Other long term (current) drug therapy: Secondary | ICD-10-CM | POA: Diagnosis not present

## 2017-06-18 DIAGNOSIS — G8929 Other chronic pain: Secondary | ICD-10-CM | POA: Diagnosis not present

## 2017-06-18 DIAGNOSIS — M545 Low back pain: Secondary | ICD-10-CM | POA: Diagnosis not present

## 2017-07-08 DIAGNOSIS — G8929 Other chronic pain: Secondary | ICD-10-CM | POA: Diagnosis not present

## 2017-07-08 DIAGNOSIS — Z79899 Other long term (current) drug therapy: Secondary | ICD-10-CM | POA: Diagnosis not present

## 2017-07-08 DIAGNOSIS — M545 Low back pain: Secondary | ICD-10-CM | POA: Diagnosis not present

## 2017-07-10 DIAGNOSIS — G894 Chronic pain syndrome: Secondary | ICD-10-CM | POA: Diagnosis not present

## 2017-07-10 DIAGNOSIS — F329 Major depressive disorder, single episode, unspecified: Secondary | ICD-10-CM | POA: Diagnosis not present

## 2017-07-10 DIAGNOSIS — R300 Dysuria: Secondary | ICD-10-CM | POA: Diagnosis not present

## 2017-07-10 DIAGNOSIS — N3010 Interstitial cystitis (chronic) without hematuria: Secondary | ICD-10-CM | POA: Diagnosis not present

## 2017-07-25 DIAGNOSIS — G8929 Other chronic pain: Secondary | ICD-10-CM | POA: Diagnosis not present

## 2017-07-25 DIAGNOSIS — M542 Cervicalgia: Secondary | ICD-10-CM | POA: Diagnosis not present

## 2017-07-25 DIAGNOSIS — M5136 Other intervertebral disc degeneration, lumbar region: Secondary | ICD-10-CM | POA: Diagnosis not present

## 2017-07-25 DIAGNOSIS — Z79899 Other long term (current) drug therapy: Secondary | ICD-10-CM | POA: Diagnosis not present

## 2017-08-07 ENCOUNTER — Encounter (HOSPITAL_COMMUNITY): Payer: Self-pay | Admitting: Family Medicine

## 2017-08-07 ENCOUNTER — Ambulatory Visit (HOSPITAL_COMMUNITY)
Admission: EM | Admit: 2017-08-07 | Discharge: 2017-08-07 | Disposition: A | Discharge status: Home / Self Care | Payer: Medicare HMO | Attending: Family Medicine | Admitting: Family Medicine

## 2017-08-07 DIAGNOSIS — R112 Nausea with vomiting, unspecified: Secondary | ICD-10-CM

## 2017-08-07 DIAGNOSIS — R109 Unspecified abdominal pain: Secondary | ICD-10-CM

## 2017-08-07 DIAGNOSIS — E860 Dehydration: Secondary | ICD-10-CM | POA: Diagnosis not present

## 2017-08-07 LAB — POCT I-STAT, CHEM 8
BUN: <3 mg/dL — ABNORMAL LOW (ref 6–20)
CALCIUM ION: 1.12 mmol/L — AB (ref 1.15–1.40)
CHLORIDE: 103 mmol/L (ref 101–111)
Creatinine, Ser: 0.5 mg/dL (ref 0.44–1.00)
Glucose, Bld: 96 mg/dL (ref 65–99)
HCT: 43 % (ref 36.0–46.0)
HEMOGLOBIN: 14.6 g/dL (ref 12.0–15.0)
Potassium: 4.4 mmol/L (ref 3.5–5.1)
SODIUM: 145 mmol/L (ref 135–145)
TCO2: 33 mmol/L — AB (ref 22–32)

## 2017-08-07 MED ORDER — SODIUM CHLORIDE 0.9 % IV BOLUS (SEPSIS)
1000.0000 mL | Freq: Once | INTRAVENOUS | Status: AC
  Administered 2017-08-07: 1000 mL via INTRAVENOUS

## 2017-08-07 MED ORDER — DICYCLOMINE HCL 20 MG PO TABS: 20.0000 mg | ORAL_TABLET | Freq: Two times a day (BID) | ORAL | 0 refills | Status: DC

## 2017-08-07 MED ORDER — ONDANSETRON HCL 4 MG PO TABS: 4.0000 mg | ORAL_TABLET | Freq: Three times a day (TID) | ORAL | 0 refills | Status: AC | PRN

## 2017-08-07 NOTE — ED Triage Notes (Addendum)
Pt here for 10 days of abd cramping, fever and fatigue.  she hasn't had anything to eat in 4 days. sts cough, congestion and rattling in chest. sts diarrhea and nausea. Pt sts that she is also grieving right now because her sons dad just died.

## 2017-08-07 NOTE — ED Provider Notes (Signed)
Yolanda Yang    CSN: [id number] Arrival date & time: 08/07/17  1749     History   Chief Complaint Chief Complaint  Patient presents with  . Abdominal Cramping  . Fatigue    HPI Yolanda Yang is a 62 y.o. female.   62 year old female, with history of IBS, GERD, diabetes, asthma, presenting today complaining of abdominal cramping with nausea, vomiting and diarrhea.  Symptoms started about 10 days ago.  States that she is also had a nonproductive cough.  She denies any fever or chills.  Is complaining of generalized weakness.  Has been unable to eat or drink over the past 3-4 days secondary to the nausea and vomiting.   The history is provided by the patient.  Abdominal Cramping  This is a new problem. The current episode started more than 1 week ago. The problem occurs constantly. The problem has not changed since onset.Associated symptoms include abdominal pain. Pertinent negatives include no chest pain, no headaches and no shortness of breath. Nothing aggravates the symptoms. Nothing relieves the symptoms. She has tried nothing for the symptoms. The treatment provided no relief.    Past Medical History:  Diagnosis Date  . Anxiety   . Asthma exacerbation    PULMOLOGIST-- DR RAMASWANY  . Bladder pain   . Borderline type 2 diabetes mellitus    DIET CONTROLLED  . Cerebral aneurysm, nonruptured monitored at [facility] by dr Anne Shutter   currently has x4 stable aneurysm's (x2 left side & x2 right side)--  2012 s/p coiling of 4 aneurysm's  . Chronic constipation   . Cystitis, interstitial   . Diabetes mellitus without complication ([place])   . GERD (gastroesophageal reflux disease)   . Headache syndrome    due to cerebral aneurysm's  . History of cerebral aneurysm repair    MULTIPLE  ANEURYSM--  S/P  COILING X4  ([facility])  . History of chronic sinusitis   . History of duodenal ulcer    2007  . History of gastric ulcer   . History of GI bleed    UPPER--  2007  AND  05-2011  . History of recurrent UTIs   . Hyperlipemia   . IBS (irritable bowel syndrome)   . Incomplete emptying of bladder   . OA (osteoarthritis)   . Right foot drop    POST LUMBAR SURGERY 2011  . Seasonal allergic rhinitis   . Urinary hesitancy   . Wears dentures    currently being fitted for full dentures  . Wears glasses     Patient Active Problem List   Diagnosis Date Noted  . Macromastia 06/09/2014  . Acute sinusitis 05/19/2014  . Asthma with acute exacerbation 05/19/2014  . Dyspnea 04/02/2013  . Status post angioplasty with stent 05/16/2011  . Hypotension due to blood loss 05/16/2011  . Tobacco abuse 05/16/2011  . Leukocytosis 05/16/2011  . Acute GI bleeding 05/15/2011  . Anemia associated with acute blood loss 05/15/2011  . HYPERLIPIDEMIA 12/15/2006  . DEPENDENCE, DRUG NOS, CONTINUOUS 12/15/2006  . ALLERGIC RHINITIS 12/15/2006  . Asthma 12/15/2006  . GERD 12/15/2006  . CYSTITIS, CHRONIC INTERSTITIAL 12/15/2006  . FIBROADENOSIS, BREAST 12/15/2006  . ENDOMETRIOSIS NOS 12/15/2006  . STATE, SYMPTOMATIC MENOPAUSE/FEM CLIMACTERIC 12/15/2006  . LOW BACK PAIN, CHRONIC 12/15/2006  . History of duodenal ulcer 12/15/2006  . LUMPECTOMY, BREAST, HX OF 12/15/2006  . HYSTERECTOMY, HX OF 12/15/2006    Past Surgical History:  Procedure Laterality Date  . ABDOMINAL HYSTERECTOMY  1997  . ANEURYSM  COILING  05-08-2011     [facility]   INTRACRANIAL COILING X4  and Right Carotid stenting due to intraoperative injury  . ANTERIOR CERVICAL DECOMP/DISCECTOMY FUSION  2002   C4 -- C7  . BREAST REDUCTION SURGERY Bilateral 06/09/2014   Procedure: BILATERAL MAMMARY REDUCTION  (BREAST);  Surgeon: Irene Limbo, MD;  Location: [place];  Service: Plastics;  Laterality: Bilateral;  . CYSTO/  HYDRODISTENTION/  INSTILLATION THERAPY  07-15-2006  . CYSTOSCOPY WITH HYDRODISTENSION AND BIOPSY N/A 09/08/2014   Procedure: CYSTOSCOPY/ HYDRODISTENSION OF BLADDER/ INSTILLATION OF MARCAINE AND PYRIDIUM;   Surgeon: Bjorn Loser, MD;  Location: [place];  Service: Urology;  Laterality: N/A;  . ESOPHAGOGASTRODUODENOSCOPY  05/17/2011   Procedure: ESOPHAGOGASTRODUODENOSCOPY (EGD);  Surgeon: Beryle Beams;  Location: [facility] ENDOSCOPY;  Service: Endoscopy;  Laterality: N/A;  . LEFT BREAST LUMPECTOMY  2000   benign  . NEGATIVE SLEEP STUDY  2011  PER PT  . POSTERIOR FUSION CERVICAL SPINE  2004   Revision C4 -- C7  . POSTERIOR LAMINECTOMY / DECOMPRESSION LUMBAR SPINE  05-17-2010   L3 -- L5  . ROTATOR CUFF REPAIR Right 2014  . TONSILLECTOMY  age 89  . TOTAL HIP ARTHROPLASTY Bilateral may & july 2013    OB History    No data available       Home Medications    Prior to Admission medications   Medication Sig Start Date End Date Taking? Authorizing Provider  acetaminophen (TYLENOL) 650 MG CR tablet Take 1,300 mg by mouth every 8 (eight) hours as needed for pain.    [provider]  ADVAIR DISKUS 250-50 MCG/DOSE AEPB INHALE 1 PUFF INTO THE LUNGS 2 (TWO) TIMES DAILY. 05/01/17   Brand Males, MD  albuterol (PROVENTIL) (2.5 MG/3ML) 0.083% nebulizer solution INHALE THE CONTENTS OF 1 VIAL IN NEBULIZER EVERY 12 HOURS AS NEEDED 08/07/15   Brand Males, MD  aspirin 81 MG chewable tablet Chew 81 mg by mouth daily.     [provider]  azithromycin (ZITHROMAX Z-PAK) 250 MG tablet Take 2 pills today, then 1 pill daily until gone. Patient not taking: Reported on 10/03/2016 08/15/15   Melony Overly, MD  beclomethasone (QVAR) 80 MCG/ACT inhaler Inhale 2 puffs into the lungs 2 (two) times daily. 08/29/15   Brand Males, MD  butalbital-acetaminophen-caffeine (FIORICET, ESGIC) [phone] MG per tablet Take 1 tablet by mouth daily as needed for headache.     [provider]  clindamycin (CLEOCIN) 300 MG capsule Take 300 mg by mouth daily as needed (for flare up).     [provider]  cyclobenzaprine (FLEXERIL) 10 MG tablet Take 10 mg by mouth 3 (three)  times daily as needed for muscle spasms.     [provider]  diazepam (VALIUM) 5 MG tablet Take 5 mg by mouth every 6 (six) hours as needed for anxiety.    [provider]  dicyclomine (BENTYL) 20 MG tablet Take 1 tablet (20 mg total) by mouth 2 (two) times daily. 10/03/16   Kirichenko, Tatyana, PA-C  dicyclomine (BENTYL) 20 MG tablet Take 1 tablet (20 mg total) by mouth 2 (two) times daily. 08/07/17   Revonda Menter C, PA-C  Diphenhyd-Hydrocort-Nystatin (FIRST-DUKES MOUTHWASH) SUSP Use as directed 5 mLs in the mouth or throat 3 (three) times daily as needed. Patient not taking: Reported on 10/03/2016 06/09/15   Parrett, Fonnie Mu, NP  DULoxetine (CYMBALTA) 60 MG capsule Take 60 mg by mouth every evening.     [provider]  fexofenadine (ALLEGRA) 180 MG tablet Take 180 mg by mouth daily as needed for allergies or rhinitis.    [provider]  gabapentin (NEURONTIN) 600 MG tablet Take 600 mg by mouth 3 (three) times daily.    [provider]  HYDROmorphone (DILAUDID) 4 MG tablet Take 1-1.5 tablets (4-6 mg total) by mouth every 4 (four) hours as needed for severe pain. 06/10/14   Irene Limbo, MD  Linaclotide (LINZESS) 290 MCG CAPS capsule Take 290 mcg by mouth every evening.     [provider]  Meth-Hyo-M Bl-Na Phos-Ph Sal (URIBEL) 118 MG CAPS Take 2 capsules by mouth 2 (two) times daily as needed (interstitial cystitis).    [provider]  nitrofurantoin, macrocrystal-monohydrate, (MACROBID) 100 MG capsule Take 100 mg by mouth daily as needed (flare up).     [provider]  nystatin cream (MYCOSTATIN) Apply 1 application topically at bedtime. PT USES PRN QHS WHEN ON ANTIBIOTICS TO PREVENT YEAST INFECTION    [provider]  omeprazole (PRILOSEC) 40 MG capsule Take 40 mg by mouth daily.    [provider]  ondansetron (ZOFRAN) 4 MG tablet Take 1 tablet (4 mg total) by mouth every 8 (eight) hours as needed for  nausea or vomiting. 08/07/17   Sebastin Perlmutter C, PA-C  pantoprazole (PROTONIX) 40 MG tablet Take 1 tablet (40 mg total) by mouth 2 (two) times daily before a meal. Patient not taking: Reported on 10/03/2016 05/20/11   Hosie Poisson, MD  pentosan polysulfate (ELMIRON) 100 MG capsule Take 100 mg by mouth 2 (two) times daily.     [provider]  potassium chloride (K-DUR) 10 MEQ tablet Take 40mg  twice a day tomorrow, and then 20mg  twice a day for two more days 10/03/16   Kirichenko, Lahoma Rocker, PA-C  Probiotic Product (PROBIOTIC DAILY PO) Take 1 capsule by mouth every evening.    [provider]  promethazine (PHENERGAN) 12.5 MG tablet Take 12.5 mg by mouth every 6 (six) hours as needed for nausea.    [provider]  ranitidine (ZANTAC) 150 MG capsule Take 1 capsule (150 mg total) by mouth 2 (two) times daily. Patient not taking: Reported on 10/03/2016 08/15/15   Melony Overly, MD  rosuvastatin (CRESTOR) 10 MG tablet Take 10 mg by mouth every evening.     [provider]  SPIRIVA HANDIHALER 18 MCG inhalation capsule INHALE THE CONTENTS OF 1 CAPSULE EVERY DAY 07/05/16   Brand Males, MD  sucralfate (CARAFATE) 1 GM/10ML suspension Take 10 mLs (1 g total) by mouth 4 (four) times daily -  with meals and at bedtime. Patient not taking: Reported on 10/03/2016 08/15/15   Melony Overly, MD  VENTOLIN HFA 108 (90 Base) MCG/ACT inhaler INHALE 2 PUFFS INTO THE LUNGS EVERY 4 HOURS AS NEEDED FOR WHEEZING OR SHORTNESS OF BREATH. NEED OFFICE VISIT 08/28/16   Brand Males, MD    Family History Family History  Problem Relation Age of Onset  . Colon cancer Father   . Leukemia Father     Social History Social History   Tobacco Use  . Smoking status: Former Smoker    Packs/day: 0.50    Years: 20.00    Pack years: 10.00    Types: Cigarettes    Last attempt to quit: 08/02/2011    Years since quitting: 6.0  . Smokeless tobacco: Never Used  Substance Use Topics  . Alcohol use: No    . Drug use: No     Allergies  Erythromycin and Latex   Review of Systems Review of Systems  Constitutional: Positive for fatigue. Negative for chills and fever.  HENT: Negative for ear pain and sore throat.   Eyes: Negative for pain and visual disturbance.  Respiratory: Positive for cough. Negative for shortness of breath.   Cardiovascular: Negative for chest pain and palpitations.  Gastrointestinal: Positive for abdominal pain, diarrhea, nausea and vomiting.  Genitourinary: Negative for dysuria and hematuria.  Musculoskeletal: Negative for arthralgias and back pain.  Skin: Negative for color change and rash.  Neurological: Positive for weakness. Negative for seizures, syncope and headaches.  All other systems reviewed and are negative.    Physical Exam Triage Vital Signs ED Triage Vitals  Enc Vitals Group     BP 08/07/17 1841 94/63     Pulse Rate 08/07/17 1841 95     Resp 08/07/17 1841 18     Temp 08/07/17 1841 99.1 F (37.3 C)     Temp src --      SpO2 08/07/17 1841 97 %     Weight --      Height --      Head Circumference --      Peak Flow --      Pain Score 08/07/17 1838 5     Pain Loc --      Pain Edu? --      Excl. in [place]? --    No data found.  Updated Vital Signs BP 94/63   Pulse 95   Temp 99.1 F (37.3 C)   Resp 18   SpO2 97%   Visual Acuity Right Eye Distance:   Left Eye Distance:   Bilateral Distance:    Right Eye Near:   Left Eye Near:    Bilateral Near:     Physical Exam  Constitutional: She appears well-developed and well-nourished. No distress.  HENT:  Head: Normocephalic and atraumatic.  Right Ear: Hearing, tympanic membrane, external ear and ear canal normal.  Left Ear: Hearing, tympanic membrane, external ear and ear canal normal.  Nose: Nose normal.  Mouth/Throat: Oropharynx is clear and moist. No oropharyngeal exudate, posterior oropharyngeal edema, posterior oropharyngeal erythema or tonsillar abscesses.  Eyes: Conjunctivae  are normal.  Neck: Neck supple.  Cardiovascular: Normal rate and regular rhythm.  No murmur heard. Pulmonary/Chest: Effort normal and breath sounds normal. No stridor. No respiratory distress. She has no wheezes. She has no rhonchi. She has no rales.  Abdominal: Soft. There is generalized tenderness.  Musculoskeletal: She exhibits no edema.  Neurological: She is alert.  Skin: Skin is warm and dry.  Psychiatric: She has a normal mood and affect.  Nursing note and vitals reviewed.    UC Treatments / Results  Labs (all labs ordered are listed, but only abnormal results are displayed) Labs Reviewed  POCT I-STAT, CHEM 8 - Abnormal; Notable for the following components:      Result Value   BUN <3 (*)    Calcium, Ion 1.12 (*)    TCO2 33 (*)    All other components within normal limits    EKG  EKG Interpretation None       Radiology No results found.  Procedures Procedures (including critical care time)  Medications Ordered in UC Medications  sodium chloride 0.9 % bolus 1,000 mL (1,000 mLs Intravenous New Bag/Given 08/07/17 1920)     Initial Impression / Assessment and Plan / UC Course  I have reviewed the triage vital signs and the nursing notes.  Pertinent labs &  imaging results that were available during my care of the patient were reviewed by me and considered in my medical decision making (see chart for details).     Patient starting to feel better after IV fluids.  Normal electrolytes.  Patient be discharged with recommendations to follow-up with primary care.  She was recommended to be evaluated in the emergency department but she declined.  Final Clinical Impressions(s) / UC Diagnoses   Final diagnoses:  Abdominal pain, unspecified abdominal location  Nausea and vomiting, intractability of vomiting not specified, unspecified vomiting type  Dehydration    ED Discharge Orders        Ordered    ondansetron (ZOFRAN) 4 MG tablet  Every 8 hours PRN      08/07/17 1945    dicyclomine (BENTYL) 20 MG tablet  2 times daily     08/07/17 1945       Controlled Substance Prescriptions [place] Controlled Substance Registry consulted? Not Applicable   Phebe Colla, [place] 08/07/17 1948

## 2017-08-25 DIAGNOSIS — Z79899 Other long term (current) drug therapy: Secondary | ICD-10-CM | POA: Diagnosis not present

## 2017-08-25 DIAGNOSIS — M47812 Spondylosis without myelopathy or radiculopathy, cervical region: Secondary | ICD-10-CM | POA: Diagnosis not present

## 2017-08-25 DIAGNOSIS — M5136 Other intervertebral disc degeneration, lumbar region: Secondary | ICD-10-CM | POA: Diagnosis not present

## 2017-08-26 DIAGNOSIS — R079 Chest pain, unspecified: Secondary | ICD-10-CM | POA: Diagnosis not present

## 2017-08-26 DIAGNOSIS — J449 Chronic obstructive pulmonary disease, unspecified: Secondary | ICD-10-CM | POA: Diagnosis not present

## 2017-08-26 DIAGNOSIS — I10 Essential (primary) hypertension: Secondary | ICD-10-CM | POA: Diagnosis not present

## 2017-08-26 DIAGNOSIS — M48061 Spinal stenosis, lumbar region without neurogenic claudication: Secondary | ICD-10-CM | POA: Diagnosis not present

## 2017-08-29 ENCOUNTER — Other Ambulatory Visit: Payer: Self-pay | Admitting: Internal Medicine

## 2017-09-02 ENCOUNTER — Other Ambulatory Visit: Payer: Self-pay | Admitting: Internal Medicine

## 2017-09-09 DIAGNOSIS — J449 Chronic obstructive pulmonary disease, unspecified: Secondary | ICD-10-CM | POA: Diagnosis not present

## 2017-09-09 DIAGNOSIS — R079 Chest pain, unspecified: Secondary | ICD-10-CM | POA: Diagnosis not present

## 2017-09-09 DIAGNOSIS — I10 Essential (primary) hypertension: Secondary | ICD-10-CM | POA: Diagnosis not present

## 2017-09-16 ENCOUNTER — Ambulatory Visit: Payer: Self-pay | Admitting: Adult Health

## 2017-09-17 DIAGNOSIS — Z87891 Personal history of nicotine dependence: Secondary | ICD-10-CM | POA: Diagnosis not present

## 2017-09-17 DIAGNOSIS — I671 Cerebral aneurysm, nonruptured: Secondary | ICD-10-CM | POA: Diagnosis not present

## 2017-09-19 DIAGNOSIS — M5136 Other intervertebral disc degeneration, lumbar region: Secondary | ICD-10-CM | POA: Diagnosis not present

## 2017-09-19 DIAGNOSIS — Z79899 Other long term (current) drug therapy: Secondary | ICD-10-CM | POA: Diagnosis not present

## 2017-09-25 ENCOUNTER — Ambulatory Visit (INDEPENDENT_AMBULATORY_CARE_PROVIDER_SITE_OTHER): Payer: Medicare HMO | Admitting: Adult Health

## 2017-09-25 ENCOUNTER — Other Ambulatory Visit (INDEPENDENT_AMBULATORY_CARE_PROVIDER_SITE_OTHER): Payer: Medicare HMO

## 2017-09-25 ENCOUNTER — Ambulatory Visit (INDEPENDENT_AMBULATORY_CARE_PROVIDER_SITE_OTHER)
Admission: RE | Admit: 2017-09-25 | Discharge: 2017-09-25 | Disposition: A | Discharge status: Home / Self Care | Payer: Medicare HMO | Source: Ambulatory Visit | Attending: Adult Health | Admitting: Adult Health

## 2017-09-25 ENCOUNTER — Encounter: Payer: Self-pay | Admitting: Adult Health

## 2017-09-25 DIAGNOSIS — J4541 Moderate persistent asthma with (acute) exacerbation: Secondary | ICD-10-CM | POA: Diagnosis not present

## 2017-09-25 DIAGNOSIS — R0602 Shortness of breath: Secondary | ICD-10-CM

## 2017-09-25 LAB — CBC WITH DIFFERENTIAL/PLATELET
BASOS ABS: 0 10*3/uL (ref 0.0–0.1)
Basophils Relative: 0.8 % (ref 0.0–3.0)
EOS ABS: 0.1 10*3/uL (ref 0.0–0.7)
Eosinophils Relative: 1.5 % (ref 0.0–5.0)
HEMATOCRIT: 40.1 % (ref 36.0–46.0)
Hemoglobin: 13.3 g/dL (ref 12.0–15.0)
LYMPHS ABS: 2.7 10*3/uL (ref 0.7–4.0)
LYMPHS PCT: 41.2 % (ref 12.0–46.0)
MCHC: 33.3 g/dL (ref 30.0–36.0)
MCV: 85.2 fl (ref 78.0–100.0)
Monocytes Absolute: 0.5 10*3/uL (ref 0.1–1.0)
Monocytes Relative: 7.4 % (ref 3.0–12.0)
NEUTROS ABS: 3.2 10*3/uL (ref 1.4–7.7)
NEUTROS PCT: 49.1 % (ref 43.0–77.0)
PLATELETS: 288 10*3/uL (ref 150.0–400.0)
RBC: 4.7 Mil/uL (ref 3.87–5.11)
RDW: 14.4 % (ref 11.5–15.5)
WBC: 6.5 10*3/uL (ref 4.0–10.5)

## 2017-09-25 MED ORDER — ALBUTEROL SULFATE HFA 108 (90 BASE) MCG/ACT IN AERS: INHALATION_SPRAY | RESPIRATORY_TRACT | 5 refills | Status: DC

## 2017-09-25 MED ORDER — BUDESONIDE-FORMOTEROL FUMARATE 80-4.5 MCG/ACT IN AERO: 2.0000 | INHALATION_SPRAY | Freq: Two times a day (BID) | RESPIRATORY_TRACT | 12 refills | Status: DC

## 2017-09-25 MED ORDER — BUDESONIDE-FORMOTEROL FUMARATE 80-4.5 MCG/ACT IN AERO: 2.0000 | INHALATION_SPRAY | Freq: Two times a day (BID) | RESPIRATORY_TRACT | 0 refills | Status: DC

## 2017-09-25 MED ORDER — AZITHROMYCIN 250 MG PO TABS: ORAL_TABLET | ORAL | 0 refills | Status: AC

## 2017-09-25 NOTE — Progress Notes (Signed)
@Patient  ID: Yolanda Yang, female    DOB: 11-Jul-1955, 62 y.o.   MRN: [id number]  Chief Complaint  Patient presents with  . Acute Visit    Asthma     Referring provider: Iona Beard, MD  HPI: 62 yo female former smoker followed for Asthma   TEST  Normal spirometry 01/2014  CXR 2017 clear   09/25/2017 Acute OV : Asthma  Pt presents for an acute office visit . Complains of increased wheezing , cough and thick mucus , post nasal drainage  And dyspnea. Previously on QVAR and Spiriva but ran out several months ago.  Previously on Symbicort . No chest pain .  Has sinus congestion and ear fullness.  Says she can not ly flat , sleeps on 4 pillows . Gets winded if she lies down .  Under a lot of stress with sick parents and husband .  Last seen in office 2016 .    Allergies  Allergen Reactions  . Erythromycin Other (See Comments)    "Heart palpitation"  . Latex Rash    Immunization History  Administered Date(s) Administered  . Influenza Split 03/26/2013, 07/01/2017  . Influenza Whole 04/29/2006  . Influenza,inj,Quad PF,6+ Mos 05/12/2014  . Td 12/05/2004    Past Medical History:  Diagnosis Date  . Anxiety   . Asthma exacerbation    PULMOLOGIST-- DR RAMASWANY  . Bladder pain   . Borderline type 2 diabetes mellitus    DIET CONTROLLED  . Cerebral aneurysm, nonruptured monitored at [facility] by dr Anne Shutter   currently has x4 stable aneurysm's (x2 left side & x2 right side)--  2012 s/p coiling of 4 aneurysm's  . Chronic constipation   . Cystitis, interstitial   . Diabetes mellitus without complication ([place])   . GERD (gastroesophageal reflux disease)   . Headache syndrome    due to cerebral aneurysm's  . History of cerebral aneurysm repair    MULTIPLE  ANEURYSM--  S/P  COILING X4  ([facility])  . History of chronic sinusitis   . History of duodenal ulcer    2007  . History of gastric ulcer   . History of GI bleed    UPPER--  2007  AND 05-2011  . History of  recurrent UTIs   . Hyperlipemia   . IBS (irritable bowel syndrome)   . Incomplete emptying of bladder   . OA (osteoarthritis)   . Right foot drop    POST LUMBAR SURGERY 2011  . Seasonal allergic rhinitis   . Urinary hesitancy   . Wears dentures    currently being fitted for full dentures  . Wears glasses     Tobacco History: Social History   Tobacco Use  Smoking Status Former Smoker  . Packs/day: 0.50  . Years: 20.00  . Pack years: 10.00  . Types: Cigarettes  . Last attempt to quit: 08/02/2011  . Years since quitting: 6.1  Smokeless Tobacco Never Used   Counseling given: Not Answered   Outpatient Encounter Medications as of 09/25/2017  Medication Sig  . ADVAIR DISKUS 250-50 MCG/DOSE AEPB INHALE 1 PUFF INTO THE LUNGS 2 (TWO) TIMES DAILY.  Marland Kitchen albuterol (PROVENTIL) (2.5 MG/3ML) 0.083% nebulizer solution INHALE THE CONTENTS OF 1 VIAL IN NEBULIZER EVERY 12 HOURS AS NEEDED  . aspirin 81 MG chewable tablet Chew 81 mg by mouth daily.   . butalbital-acetaminophen-caffeine (FIORICET, ESGIC) 50-325-40 MG per tablet Take 1 tablet by mouth daily as needed for headache.   . cyclobenzaprine (FLEXERIL) 10 MG  tablet Take 10 mg by mouth 3 (three) times daily as needed for muscle spasms.   . diazepam (VALIUM) 5 MG tablet Take 5 mg by mouth every 6 (six) hours as needed for anxiety.  . dicyclomine (BENTYL) 20 MG tablet Take 1 tablet (20 mg total) by mouth 2 (two) times daily.  . Diphenhyd-Hydrocort-Nystatin (FIRST-DUKES MOUTHWASH) SUSP Use as directed 5 mLs in the mouth or throat 3 (three) times daily as needed.  . DULoxetine (CYMBALTA) 60 MG capsule Take 60 mg by mouth every evening.   . fexofenadine (ALLEGRA) 180 MG tablet Take 180 mg by mouth daily as needed for allergies or rhinitis.  Marland Kitchen gabapentin (NEURONTIN) 600 MG tablet Take 600 mg by mouth 3 (three) times daily.  Marland Kitchen HYDROmorphone (DILAUDID) 4 MG tablet Take 1-1.5 tablets (4-6 mg total) by mouth every 4 (four) hours as needed for severe  pain.  . Linaclotide (LINZESS) 290 MCG CAPS capsule Take 290 mcg by mouth every evening.   . Meth-Hyo-M Bl-Na Phos-Ph Sal (URIBEL) 118 MG CAPS Take 2 capsules by mouth 2 (two) times daily as needed (interstitial cystitis).  . nitrofurantoin, macrocrystal-monohydrate, (MACROBID) 100 MG capsule Take 100 mg by mouth daily as needed (flare up).   . nystatin cream (MYCOSTATIN) Apply 1 application topically at bedtime. PT USES PRN QHS WHEN ON ANTIBIOTICS TO PREVENT YEAST INFECTION  . ondansetron (ZOFRAN) 4 MG tablet Take 1 tablet (4 mg total) by mouth every 8 (eight) hours as needed for nausea or vomiting.  . pentosan polysulfate (ELMIRON) 100 MG capsule Take 100 mg by mouth 2 (two) times daily.   . Probiotic Product (PROBIOTIC DAILY PO) Take 1 capsule by mouth every evening.  . promethazine (PHENERGAN) 12.5 MG tablet Take 12.5 mg by mouth every 6 (six) hours as needed for nausea.  . rosuvastatin (CRESTOR) 10 MG tablet Take 10 mg by mouth every evening.   . VENTOLIN HFA 108 (90 Base) MCG/ACT inhaler INHALE 2 PUFFS INTO THE LUNGS EVERY 4 HOURS AS NEEDED FOR WHEEZING OR SHORTNESS OF BREATH. NEED OFFICE VISIT  . azithromycin (ZITHROMAX Z-PAK) 250 MG tablet Take 2 tablets (500 mg) on  Day 1,  followed by 1 tablet (250 mg) once daily on Days 2 through 5.  . beclomethasone (QVAR) 80 MCG/ACT inhaler Inhale 2 puffs into the lungs 2 (two) times daily. (Patient not taking: Reported on 09/25/2017)  . omeprazole (PRILOSEC) 40 MG capsule Take 40 mg by mouth daily.  . pantoprazole (PROTONIX) 40 MG tablet Take 1 tablet (40 mg total) by mouth 2 (two) times daily before a meal. (Patient not taking: Reported on 10/03/2016)  . potassium chloride (K-DUR) 10 MEQ tablet Take 40mg  twice a day tomorrow, and then 20mg  twice a day for two more days (Patient not taking: Reported on 09/25/2017)  . ranitidine (ZANTAC) 150 MG capsule Take 1 capsule (150 mg total) by mouth 2 (two) times daily. (Patient not taking: Reported on 10/03/2016)  .  SPIRIVA HANDIHALER 18 MCG inhalation capsule INHALE THE CONTENTS OF 1 CAPSULE EVERY DAY (Patient not taking: Reported on 09/25/2017)  . sucralfate (CARAFATE) 1 GM/10ML suspension Take 10 mLs (1 g total) by mouth 4 (four) times daily -  with meals and at bedtime. (Patient not taking: Reported on 10/03/2016)  . [DISCONTINUED] acetaminophen (TYLENOL) 650 MG CR tablet Take 1,300 mg by mouth every 8 (eight) hours as needed for pain.  . [DISCONTINUED] azithromycin (ZITHROMAX Z-PAK) 250 MG tablet Take 2 pills today, then 1 pill daily until gone. (Patient  not taking: Reported on 10/03/2016)  . [DISCONTINUED] clindamycin (CLEOCIN) 300 MG capsule Take 300 mg by mouth daily as needed (for flare up).   . [DISCONTINUED] dicyclomine (BENTYL) 20 MG tablet Take 1 tablet (20 mg total) by mouth 2 (two) times daily. (Patient not taking: Reported on 09/25/2017)   No facility-administered encounter medications on file as of 09/25/2017.      Review of Systems  Constitutional:   No  weight loss, night sweats,  Fevers, chills,  +fatigue, or  lassitude.  HEENT:   No headaches,  Difficulty swallowing,  Tooth/dental problems, or  Sore throat,                No sneezing, itching, ear ache,  +nasal congestion, post nasal drip,   CV:  No chest pain,  Orthopnea, PND, swelling in lower extremities, anasarca, dizziness, palpitations, syncope.   GI  No heartburn, indigestion, abdominal pain, nausea, vomiting, diarrhea, change in bowel habits, loss of appetite, bloody stools.   Resp:   No chest wall deformity  Skin: no rash or lesions.  GU: no dysuria, change in color of urine, no urgency or frequency.  No flank pain, no hematuria   MS:  No joint pain or swelling.  No decreased range of motion.  No back pain.    Physical Exam  BP 126/82 (BP Location: Left Arm, Cuff Size: Normal)   Pulse 95   Ht 5\' 5"  (1.651 m)   Wt 174 lb 3.2 oz (79 kg)   SpO2 98%   BMI 28.99 kg/m   GEN: A/Ox3; pleasant , NAD, well nourished      HEENT:  [place]/AT,  EACs-clear, TMs-wnl, NOSE-clear, THROAT-clear, no lesions, no postnasal drip or exudate noted.   NECK:  Supple w/ fair ROM; no JVD; normal carotid impulses w/o bruits; no thyromegaly or nodules palpated; no lymphadenopathy.    RESP  Clear  P & A; w/o, wheezes/ rales/ or rhonchi. no accessory muscle use, no dullness to percussion  CARD:  RRR, no m/r/g, no peripheral edema, pulses intact, no cyanosis or clubbing.  GI:   Soft & nt; nml bowel sounds; no organomegaly or masses detected.   Musco: Warm bil, no deformities or joint swelling noted.   Neuro: alert, no focal deficits noted.    Skin: Warm, no lesions or rashes    Lab Results:  CBC  BMET  BNP No results found for: BNP  ProBNP No results found for: PROBNP  Imaging: No results found.   Assessment & Plan:   Asthma with acute exacerbation Flare off therapy  Restart rx, trial of Symbicort  Check cxr   Plan  Patient Instructions  ZPack take as directed.  Mucinex DM Twice daily  .As needed  Cough/congestion .  Begin on Symbicort 2 puffs Twice daily  , rinse after use.  Chest xray today  Labs today .  Follow up with Dr. Chase Caller in 6-8 weeks with PFT and .As needed   Please contact office for sooner follow up if symptoms do not improve or worsen or seek emergency care '      Dyspnea ? Etiology  Check cxr  Labs with bnp  PFT on return      Riven Mabile, NP 09/25/2017

## 2017-09-25 NOTE — Addendum Note (Signed)
Addended by: Parke Poisson E on: 09/25/2017 05:02 PM   Modules accepted: Orders

## 2017-09-25 NOTE — Addendum Note (Signed)
Addended by: Madolyn Frieze on: 09/25/2017 04:34 PM   Modules accepted: Orders

## 2017-09-25 NOTE — Patient Instructions (Addendum)
ZPack take as directed.  Mucinex DM Twice daily  .As needed  Cough/congestion .  Begin on Symbicort 2 puffs Twice daily  , rinse after use.  Chest xray today  Labs today .  Follow up with Dr. Chase Caller in 6-8 weeks with PFT and .As needed   Please contact office for sooner follow up if symptoms do not improve or worsen or seek emergency care '

## 2017-09-25 NOTE — Assessment & Plan Note (Signed)
?   Etiology  Check cxr  Labs with bnp  PFT on return

## 2017-09-25 NOTE — Assessment & Plan Note (Signed)
Flare off therapy  Restart rx, trial of Symbicort  Check cxr   Plan  Patient Instructions  ZPack take as directed.  Mucinex DM Twice daily  .As needed  Cough/congestion .  Begin on Symbicort 2 puffs Twice daily  , rinse after use.  Chest xray today  Labs today .  Follow up with Dr. Chase Caller in 6-8 weeks with PFT and .As needed   Please contact office for sooner follow up if symptoms do not improve or worsen or seek emergency care '

## 2017-09-26 LAB — BASIC METABOLIC PANEL
BUN: 5 mg/dL — AB (ref 6–23)
CHLORIDE: 102 meq/L (ref 96–112)
CO2: 31 mEq/L (ref 19–32)
Calcium: 9.1 mg/dL (ref 8.4–10.5)
Creatinine, Ser: 0.6 mg/dL (ref 0.40–1.20)
GFR: 131.33 mL/min (ref >60.00)
GLUCOSE: 81 mg/dL (ref 70–99)
Potassium: 4 mEq/L (ref 3.5–5.1)
Sodium: 141 mEq/L (ref 135–145)

## 2017-09-26 LAB — BRAIN NATRIURETIC PEPTIDE: PRO B NATRI PEPTIDE: 61 pg/mL (ref 0.0–100.0)

## 2017-09-26 NOTE — Progress Notes (Signed)
Called spoke with patient, advised of cxr results / recs as stated by TP.  Pt verbalized her understanding and denied any questions. 

## 2017-09-26 NOTE — Progress Notes (Signed)
Called spoke with patient, advised of lab results / recs as stated by TP.  Pt verbalized her understanding and denied any questions. 

## 2017-10-16 DIAGNOSIS — K5904 Chronic idiopathic constipation: Secondary | ICD-10-CM | POA: Diagnosis not present

## 2017-10-16 DIAGNOSIS — K219 Gastro-esophageal reflux disease without esophagitis: Secondary | ICD-10-CM | POA: Diagnosis not present

## 2017-10-28 DIAGNOSIS — G8929 Other chronic pain: Secondary | ICD-10-CM | POA: Diagnosis not present

## 2017-10-28 DIAGNOSIS — M542 Cervicalgia: Secondary | ICD-10-CM | POA: Diagnosis not present

## 2017-10-28 DIAGNOSIS — M5136 Other intervertebral disc degeneration, lumbar region: Secondary | ICD-10-CM | POA: Diagnosis not present

## 2017-10-28 DIAGNOSIS — Z79899 Other long term (current) drug therapy: Secondary | ICD-10-CM | POA: Diagnosis not present

## 2017-11-17 DIAGNOSIS — Z1231 Encounter for screening mammogram for malignant neoplasm of breast: Secondary | ICD-10-CM | POA: Diagnosis not present

## 2017-11-17 DIAGNOSIS — Z6829 Body mass index (BMI) 29.0-29.9, adult: Secondary | ICD-10-CM | POA: Diagnosis not present

## 2017-11-17 DIAGNOSIS — Z1272 Encounter for screening for malignant neoplasm of vagina: Secondary | ICD-10-CM | POA: Diagnosis not present

## 2017-11-17 DIAGNOSIS — Z124 Encounter for screening for malignant neoplasm of cervix: Secondary | ICD-10-CM | POA: Diagnosis not present

## 2017-11-17 DIAGNOSIS — Z90712 Acquired absence of cervix with remaining uterus: Secondary | ICD-10-CM | POA: Diagnosis not present

## 2017-11-17 DIAGNOSIS — N760 Acute vaginitis: Secondary | ICD-10-CM | POA: Diagnosis not present

## 2017-11-19 ENCOUNTER — Ambulatory Visit: Payer: Self-pay | Admitting: Internal Medicine

## 2017-12-02 DIAGNOSIS — M47812 Spondylosis without myelopathy or radiculopathy, cervical region: Secondary | ICD-10-CM | POA: Diagnosis not present

## 2017-12-02 DIAGNOSIS — M5136 Other intervertebral disc degeneration, lumbar region: Secondary | ICD-10-CM | POA: Diagnosis not present

## 2017-12-02 DIAGNOSIS — Z79899 Other long term (current) drug therapy: Secondary | ICD-10-CM | POA: Diagnosis not present

## 2017-12-09 ENCOUNTER — Other Ambulatory Visit: Payer: Self-pay | Admitting: Internal Medicine

## 2017-12-16 ENCOUNTER — Ambulatory Visit (INDEPENDENT_AMBULATORY_CARE_PROVIDER_SITE_OTHER): Payer: Medicare HMO | Admitting: Internal Medicine

## 2017-12-16 ENCOUNTER — Ambulatory Visit (INDEPENDENT_AMBULATORY_CARE_PROVIDER_SITE_OTHER): Payer: Medicare HMO | Admitting: Adult Health

## 2017-12-16 ENCOUNTER — Encounter: Payer: Self-pay | Admitting: Adult Health

## 2017-12-16 DIAGNOSIS — J4541 Moderate persistent asthma with (acute) exacerbation: Secondary | ICD-10-CM

## 2017-12-16 DIAGNOSIS — J4530 Mild persistent asthma, uncomplicated: Secondary | ICD-10-CM

## 2017-12-16 DIAGNOSIS — R0600 Dyspnea, unspecified: Secondary | ICD-10-CM

## 2017-12-16 LAB — PULMONARY FUNCTION TEST
DL/VA % pred: 95 %
DL/VA: 4.69 ml/min/mmHg/L
DLCO UNC % PRED: 91 %
DLCO UNC: 23.36 ml/min/mmHg
FEF 25-75 PRE: 0.87 L/s
FEF 25-75 Post: 1.85 L/sec
FEF2575-%Change-Post: 113 %
FEF2575-%Pred-Post: 86 %
FEF2575-%Pred-Pre: 40 %
FEV1-%CHANGE-POST: 24 %
FEV1-%PRED-POST: 99 %
FEV1-%PRED-PRE: 79 %
FEV1-POST: 2.18 L
FEV1-Pre: 1.75 L
FEV1FVC-%Change-Post: 14 %
FEV1FVC-%Pred-Pre: 79 %
FEV6-%Change-Post: 9 %
FEV6-%PRED-POST: 111 %
FEV6-%Pred-Pre: 102 %
FEV6-POST: 3.01 L
FEV6-PRE: 2.75 L
FEV6FVC-%CHANGE-POST: 0 %
FEV6FVC-%PRED-POST: 102 %
FEV6FVC-%PRED-PRE: 102 %
FVC-%Change-Post: 9 %
FVC-%PRED-POST: 109 %
FVC-%Pred-Pre: 99 %
FVC-Post: 3.03 L
FVC-Pre: 2.78 L
PRE FEV6/FVC RATIO: 99 %
Post FEV1/FVC ratio: 72 %
Post FEV6/FVC ratio: 100 %
Pre FEV1/FVC ratio: 63 %
RV % PRED: 134 %
RV: 2.73 L
TLC % PRED: 109 %
TLC: 5.69 L

## 2017-12-16 MED ORDER — FIRST-DUKES MOUTHWASH MT SUSP: 5.0000 mL | Freq: Three times a day (TID) | OROMUCOSAL | 0 refills | Status: DC | PRN

## 2017-12-16 NOTE — Progress Notes (Signed)
@Patient  ID: Yolanda Yang, female    DOB: 1955-08-30, 62 y.o.   MRN: [id number]  Chief Complaint  Patient presents with  . Follow-up    Asthma    Referring provider: Iona Beard, MD  HPI: 62 yo female former smoker followed for Asthma  PMH DJD , Back surgery . Chronic UTI /IC (takes macrobid As needed-not daily )   TEST  Normal Spirometry 01/2014   12/16/2017 Follow up : Asthma  Patient returns for a 34-month follow-up.  Last visit patient had an asthma flare.  She was treated with Z-Pak and started on Symbicort.  Since then patient says she is feeling better. Feels Symbicort is helping . No flare of cough or wheezing . Gets winded with heavy activity . She has trouble with getting around due to back issues and foot drop (after back surgery ) .  PFT done today shows normal lung function with FEV1 at 99%, ratio 72, FVC 109%.,  DLCO 91%.  Positive bronchodilator response  Last visit patient been having some increased shortness of breath.  Lab work was unrevealing with a normal CBC and BNP.  Chest x-ray showed clear lungs.   Allergies  Allergen Reactions  . Erythromycin Other (See Comments)    "Heart palpitation"  . Latex Rash    Immunization History  Administered Date(s) Administered  . Influenza Split 03/26/2013, 07/01/2017  . Influenza Whole 04/29/2006  . Influenza,inj,Quad PF,6+ Mos 05/12/2014, 02/24/2017  . Td 12/05/2004    Past Medical History:  Diagnosis Date  . Anxiety   . Asthma exacerbation    PULMOLOGIST-- DR RAMASWANY  . Bladder pain   . Borderline type 2 diabetes mellitus    DIET CONTROLLED  . Cerebral aneurysm, nonruptured monitored at [facility] by dr Anne Shutter   currently has x4 stable aneurysm's (x2 left side & x2 right side)--  2012 s/p coiling of 4 aneurysm's  . Chronic constipation   . Cystitis, interstitial   . Diabetes mellitus without complication ([place])   . GERD (gastroesophageal reflux disease)   . Headache syndrome    due to cerebral  aneurysm's  . History of cerebral aneurysm repair    MULTIPLE  ANEURYSM--  S/P  COILING X4  ([facility])  . History of chronic sinusitis   . History of duodenal ulcer    2007  . History of gastric ulcer   . History of GI bleed    UPPER--  2007  AND 05-2011  . History of recurrent UTIs   . Hyperlipemia   . IBS (irritable bowel syndrome)   . Incomplete emptying of bladder   . OA (osteoarthritis)   . Right foot drop    POST LUMBAR SURGERY 2011  . Seasonal allergic rhinitis   . Urinary hesitancy   . Wears dentures    currently being fitted for full dentures  . Wears glasses     Tobacco History: Social History   Tobacco Use  Smoking Status Former Smoker  . Packs/day: 0.50  . Years: 20.00  . Pack years: 10.00  . Types: Cigarettes  . Last attempt to quit: 08/02/2011  . Years since quitting: 6.3  Smokeless Tobacco Never Used   Counseling given: Not Answered   Outpatient Encounter Medications as of 12/16/2017  Medication Sig  . albuterol (PROVENTIL HFA;VENTOLIN HFA) 108 (90 Base) MCG/ACT inhaler INHALE 2 PUFFS INTO THE LUNGS EVERY 4 HOURS AS NEEDED FOR WHEEZING OR SHORTNESS OF BREATH.  Marland Kitchen albuterol (PROVENTIL) (2.5 MG/3ML) 0.083% nebulizer solution INHALE THE  CONTENTS OF 1 VIAL IN NEBULIZER EVERY 12 HOURS AS NEEDED  . aspirin 81 MG chewable tablet Chew 81 mg by mouth daily.   . budesonide-formoterol (SYMBICORT) 80-4.5 MCG/ACT inhaler Inhale 2 puffs into the lungs 2 (two) times daily.  . budesonide-formoterol (SYMBICORT) 80-4.5 MCG/ACT inhaler Inhale 2 puffs into the lungs 2 (two) times daily.  . butalbital-acetaminophen-caffeine (FIORICET, ESGIC) 50-325-40 MG per tablet Take 1 tablet by mouth daily as needed for headache.   . cyclobenzaprine (FLEXERIL) 10 MG tablet Take 10 mg by mouth 3 (three) times daily as needed for muscle spasms.   . diazepam (VALIUM) 5 MG tablet Take 5 mg by mouth every 6 (six) hours as needed for anxiety.  . dicyclomine (BENTYL) 20 MG tablet Take 1 tablet (20  mg total) by mouth 2 (two) times daily.  . Diphenhyd-Hydrocort-Nystatin (FIRST-DUKES MOUTHWASH) SUSP Use as directed 5 mLs in the mouth or throat 3 (three) times daily as needed.  . DULoxetine (CYMBALTA) 60 MG capsule Take 60 mg by mouth every evening.   Marland Kitchen estradiol (VIVELLE-DOT) 0.1 MG/24HR patch Place 1 patch onto the skin 2 (two) times a week.   . fexofenadine (ALLEGRA) 180 MG tablet Take 180 mg by mouth daily as needed for allergies or rhinitis.  Marland Kitchen gabapentin (NEURONTIN) 600 MG tablet Take 600 mg by mouth 3 (three) times daily.  Marland Kitchen HYDROmorphone (DILAUDID) 4 MG tablet Take 1-1.5 tablets (4-6 mg total) by mouth every 4 (four) hours as needed for severe pain.  . Linaclotide (LINZESS) 290 MCG CAPS capsule Take 290 mcg by mouth every evening.   . Meth-Hyo-M Bl-Na Phos-Ph Sal (URIBEL) 118 MG CAPS Take 2 capsules by mouth 2 (two) times daily as needed (interstitial cystitis).  . nitrofurantoin, macrocrystal-monohydrate, (MACROBID) 100 MG capsule Take 100 mg by mouth daily as needed (flare up).   . nystatin cream (MYCOSTATIN) Apply 1 application topically at bedtime. PT USES PRN QHS WHEN ON ANTIBIOTICS TO PREVENT YEAST INFECTION  . omeprazole (PRILOSEC) 40 MG capsule Take 40 mg by mouth daily.  . ondansetron (ZOFRAN) 4 MG tablet Take 1 tablet (4 mg total) by mouth every 8 (eight) hours as needed for nausea or vomiting.  . pantoprazole (PROTONIX) 40 MG tablet Take 1 tablet (40 mg total) by mouth 2 (two) times daily before a meal.  . pentosan polysulfate (ELMIRON) 100 MG capsule Take 100 mg by mouth 2 (two) times daily.   . potassium chloride (K-DUR) 10 MEQ tablet Take 40mg  twice a day tomorrow, and then 20mg  twice a day for two more days  . Probiotic Product (PROBIOTIC DAILY PO) Take 1 capsule by mouth every evening.  . promethazine (PHENERGAN) 12.5 MG tablet Take 12.5 mg by mouth every 6 (six) hours as needed for nausea.  . ranitidine (ZANTAC) 150 MG capsule Take 1 capsule (150 mg total) by mouth 2  (two) times daily.  . rosuvastatin (CRESTOR) 10 MG tablet Take 10 mg by mouth every evening.   . sucralfate (CARAFATE) 1 GM/10ML suspension Take 10 mLs (1 g total) by mouth 4 (four) times daily -  with meals and at bedtime.  . [DISCONTINUED] Diphenhyd-Hydrocort-Nystatin (FIRST-DUKES MOUTHWASH) SUSP Use as directed 5 mLs in the mouth or throat 3 (three) times daily as needed.  . [DISCONTINUED] SPIRIVA HANDIHALER 18 MCG inhalation capsule INHALE THE CONTENTS OF 1 CAPSULE EVERY DAY (Patient not taking: Reported on 12/16/2017)   No facility-administered encounter medications on file as of 12/16/2017.      Review of Systems  Constitutional:  No  weight loss, night sweats,  Fevers, chills,  +fatigue, or  lassitude.  HEENT:   No headaches,  Difficulty swallowing,  Tooth/dental problems, or  Sore throat,                No sneezing, itching, ear ache, nasal congestion, post nasal drip,   CV:  No chest pain,  Orthopnea, PND, swelling in lower extremities, anasarca, dizziness, palpitations, syncope.   GI  No heartburn, indigestion, abdominal pain, nausea, vomiting, diarrhea, change in bowel habits, loss of appetite, bloody stools.   Resp:    No excess mucus, no productive cough,  No non-productive cough,  No coughing up of blood.  No change in color of mucus.  No wheezing.  No chest wall deformity  Skin: no rash or lesions.  GU: +chronic UTI   MS:  No joint pain or swelling.  No decreased range of motion.  No back pain.    Physical Exam  BP 130/74 (BP Location: Left Arm, Cuff Size: Normal)   Pulse 81   Ht 5\' 5"  (1.651 m)   Wt 175 lb 6.4 oz (79.6 kg)   SpO2 98%   BMI 29.19 kg/m   GEN: A/Ox3; pleasant , NAD, elderly    HEENT:  /AT,  EACs-clear, TMs-wnl, NOSE-clear, THROAT-clear, no lesions, no postnasal drip or exudate noted.   NECK:  Supple w/ fair ROM; no JVD; normal carotid impulses w/o bruits; no thyromegaly or nodules palpated; no lymphadenopathy.    RESP  Clear  P & A; w/o,  wheezes/ rales/ or rhonchi. no accessory muscle use, no dullness to percussion  CARD:  RRR, no m/r/g, no peripheral edema, pulses intact, no cyanosis or clubbing.  GI:   Soft & nt; nml bowel sounds; no organomegaly or masses detected.   Musco: Warm bil, no deformities or joint swelling noted.   Neuro: alert, no focal deficits noted.    Skin: Warm, no lesions or rashes    Lab Results:  CBC    Component Value Date/Time   WBC 6.5 09/25/2017 1657   RBC 4.70 09/25/2017 1657   HGB 13.3 09/25/2017 1657   HCT 40.1 09/25/2017 1657   PLT 288.0 09/25/2017 1657   MCV 85.2 09/25/2017 1657   MCH 27.8 10/03/2016 1805   MCHC 33.3 09/25/2017 1657   RDW 14.4 09/25/2017 1657   LYMPHSABS 2.7 09/25/2017 1657   MONOABS 0.5 09/25/2017 1657   EOSABS 0.1 09/25/2017 1657   BASOSABS 0.0 09/25/2017 1657    BMET    Component Value Date/Time   NA 141 09/25/2017 1657   K 4.0 09/25/2017 1657   CL 102 09/25/2017 1657   CO2 31 09/25/2017 1657   GLUCOSE 81 09/25/2017 1657   BUN 5 (L) 09/25/2017 1657   CREATININE 0.60 09/25/2017 1657   CALCIUM 9.1 09/25/2017 1657   GFRNONAA >60 10/03/2016 1805   GFRAA >60 10/03/2016 1805    BNP No results found for: BNP  ProBNP    Component Value Date/Time   PROBNP 61.0 09/25/2017 1657    Imaging: No results found.   Assessment & Plan:   Asthma Well controlled. Improved symptom control on Symbicort.  PFT today show pre BD mild obstruction , with normalization with BD .   Plan  Patient Instructions  Continue on Symbicort 2 puffs twice daily, rinse after use. Albuterol inhaler As needed  Wheezing and shortness of breath .  Follow up with Dr. Chase Caller in 6 months and As needed  Dyspnea Improved on Symbicort . Workup with unrevealing with nml cbc and bnp .       Rexene Edison, NP 12/16/2017

## 2017-12-16 NOTE — Patient Instructions (Signed)
Continue on Symbicort 2 puffs twice daily, rinse after use. Albuterol inhaler As needed  Wheezing and shortness of breath .  Follow up with Dr. Chase Caller in 6 months and As needed

## 2017-12-16 NOTE — Assessment & Plan Note (Signed)
Well controlled. Improved symptom control on Symbicort.  PFT today show pre BD mild obstruction , with normalization with BD .   Plan  Patient Instructions  Continue on Symbicort 2 puffs twice daily, rinse after use. Albuterol inhaler As needed  Wheezing and shortness of breath .  Follow up with Dr. Chase Caller in 6 months and As needed

## 2017-12-16 NOTE — Progress Notes (Signed)
PFT done today. 

## 2017-12-16 NOTE — Assessment & Plan Note (Signed)
Improved on Symbicort . Workup with unrevealing with nml cbc and bnp .

## 2017-12-26 ENCOUNTER — Ambulatory Visit: Payer: Self-pay | Admitting: Internal Medicine

## 2018-01-06 DIAGNOSIS — M47812 Spondylosis without myelopathy or radiculopathy, cervical region: Secondary | ICD-10-CM | POA: Diagnosis not present

## 2018-01-06 DIAGNOSIS — Z79899 Other long term (current) drug therapy: Secondary | ICD-10-CM | POA: Diagnosis not present

## 2018-01-06 DIAGNOSIS — M5136 Other intervertebral disc degeneration, lumbar region: Secondary | ICD-10-CM | POA: Diagnosis not present

## 2018-01-06 DIAGNOSIS — Z6829 Body mass index (BMI) 29.0-29.9, adult: Secondary | ICD-10-CM | POA: Diagnosis not present

## 2018-01-13 DIAGNOSIS — H524 Presbyopia: Secondary | ICD-10-CM | POA: Diagnosis not present

## 2018-01-13 DIAGNOSIS — H2513 Age-related nuclear cataract, bilateral: Secondary | ICD-10-CM | POA: Diagnosis not present

## 2018-01-13 DIAGNOSIS — H10413 Chronic giant papillary conjunctivitis, bilateral: Secondary | ICD-10-CM | POA: Diagnosis not present

## 2018-01-13 DIAGNOSIS — H04123 Dry eye syndrome of bilateral lacrimal glands: Secondary | ICD-10-CM | POA: Diagnosis not present

## 2018-01-15 ENCOUNTER — Ambulatory Visit: Payer: Medicaid Other | Admitting: Podiatry

## 2018-01-20 ENCOUNTER — Telehealth: Payer: Self-pay | Admitting: Internal Medicine

## 2018-01-20 NOTE — Telephone Encounter (Signed)
Attempted to call patient today regarding thrush symptoms from inhaler use. I did not receive an answer at time of call. I have left a voicemail message for pt to return call. X1

## 2018-01-21 NOTE — Telephone Encounter (Signed)
Called and spoke with pt regarding having thrush on her tongue Pt states when using albuterol and symbicort 80 inhalers she gets thrush all the time Pt reports she rinses her mouth with water after each use, and brushes her teeth and tongue Pt requesting medication for thrush, and is concerned why she is getting thrush so much  MR please advise.

## 2018-01-21 NOTE — Telephone Encounter (Signed)
Pt called back stating [place] and okay to leave message on suggestions from MR. MR Please advise on what to give patient. Thanks.

## 2018-01-21 NOTE — Telephone Encounter (Signed)
Pt is calling back. Cb is [phone]

## 2018-01-21 NOTE — Telephone Encounter (Signed)
shge needs to rinse mouth with listerine If she keeps getting thrush then we can change inhaler  For now  # Oral thrush - For Oral thrush: Take Suspension (swish and swallow): 500,000 units 4 times/day for 5 days; swish in the mouth and retain for as long as possible (several minutes) before swallowing. IF nystatin is in back order: alternatives are  A) clotrimazole troche (throt lozenge) 10mg  dissolved and swish and swallow 5 times a day for 14 days. Or B  B) Miconazole 2% oral gel

## 2018-01-21 NOTE — Telephone Encounter (Signed)
#   Oral thrush - For Oral thrush: Take Suspension (swish and swallow): 500,000 units 4 times/day for 5 days; swish in the mouth and retain for as long as possible (several minutes) before swallowing.    IF nystatin is in back order: alternatives are  A) clotrimazole troche (throt lozenge) 10mg dissolved and swish and swallow 5 times a day for 14 days.   

## 2018-01-21 NOTE — Telephone Encounter (Signed)
Attempted to call patient today regarding MR recommendations below about thrush. I did not receive an answer at time of call. I have left a voicemail message for pt to return call. X1

## 2018-01-22 NOTE — Telephone Encounter (Signed)
Attempted to call patient today regarding MR recommendations below about thrush. I did not receive an answer at time of call. I have left a voicemail message forptto return call. X2

## 2018-01-23 MED ORDER — NYSTATIN 100000 UNIT/ML MT SUSP: 5.0000 mL | Freq: Four times a day (QID) | OROMUCOSAL | 0 refills | Status: AC

## 2018-01-23 NOTE — Telephone Encounter (Signed)
Spoke with patient. She is aware of MR's recs. Will go ahead and call in Nystatin for to [facility] on [address]. Nothing further needed at time of call.

## 2018-02-02 DIAGNOSIS — Z79899 Other long term (current) drug therapy: Secondary | ICD-10-CM | POA: Diagnosis not present

## 2018-02-02 DIAGNOSIS — M5136 Other intervertebral disc degeneration, lumbar region: Secondary | ICD-10-CM | POA: Diagnosis not present

## 2018-02-02 DIAGNOSIS — Z6829 Body mass index (BMI) 29.0-29.9, adult: Secondary | ICD-10-CM | POA: Diagnosis not present

## 2018-02-04 DIAGNOSIS — M25511 Pain in right shoulder: Secondary | ICD-10-CM | POA: Diagnosis not present

## 2018-02-04 DIAGNOSIS — M19011 Primary osteoarthritis, right shoulder: Secondary | ICD-10-CM | POA: Diagnosis not present

## 2018-02-04 DIAGNOSIS — M19012 Primary osteoarthritis, left shoulder: Secondary | ICD-10-CM | POA: Diagnosis not present

## 2018-02-04 DIAGNOSIS — M25512 Pain in left shoulder: Secondary | ICD-10-CM | POA: Diagnosis not present

## 2018-02-04 DIAGNOSIS — M75102 Unspecified rotator cuff tear or rupture of left shoulder, not specified as traumatic: Secondary | ICD-10-CM | POA: Diagnosis not present

## 2018-02-04 DIAGNOSIS — G8929 Other chronic pain: Secondary | ICD-10-CM | POA: Diagnosis not present

## 2018-03-23 DIAGNOSIS — Z79899 Other long term (current) drug therapy: Secondary | ICD-10-CM | POA: Diagnosis not present

## 2018-03-23 DIAGNOSIS — M47812 Spondylosis without myelopathy or radiculopathy, cervical region: Secondary | ICD-10-CM | POA: Diagnosis not present

## 2018-03-23 DIAGNOSIS — Z6829 Body mass index (BMI) 29.0-29.9, adult: Secondary | ICD-10-CM | POA: Diagnosis not present

## 2018-03-23 DIAGNOSIS — M5136 Other intervertebral disc degeneration, lumbar region: Secondary | ICD-10-CM | POA: Diagnosis not present

## 2018-04-06 DIAGNOSIS — J449 Chronic obstructive pulmonary disease, unspecified: Secondary | ICD-10-CM | POA: Diagnosis not present

## 2018-04-06 DIAGNOSIS — L84 Corns and callosities: Secondary | ICD-10-CM | POA: Diagnosis not present

## 2018-04-06 DIAGNOSIS — E785 Hyperlipidemia, unspecified: Secondary | ICD-10-CM | POA: Diagnosis not present

## 2018-04-06 DIAGNOSIS — I10 Essential (primary) hypertension: Secondary | ICD-10-CM | POA: Diagnosis not present

## 2018-04-06 DIAGNOSIS — Z6829 Body mass index (BMI) 29.0-29.9, adult: Secondary | ICD-10-CM | POA: Diagnosis not present

## 2018-04-07 DIAGNOSIS — N390 Urinary tract infection, site not specified: Secondary | ICD-10-CM | POA: Diagnosis not present

## 2018-04-10 DIAGNOSIS — M47812 Spondylosis without myelopathy or radiculopathy, cervical region: Secondary | ICD-10-CM | POA: Diagnosis not present

## 2018-04-10 DIAGNOSIS — Z79899 Other long term (current) drug therapy: Secondary | ICD-10-CM | POA: Diagnosis not present

## 2018-04-10 DIAGNOSIS — M5136 Other intervertebral disc degeneration, lumbar region: Secondary | ICD-10-CM | POA: Diagnosis not present

## 2018-04-10 DIAGNOSIS — Z6829 Body mass index (BMI) 29.0-29.9, adult: Secondary | ICD-10-CM | POA: Diagnosis not present

## 2018-04-20 IMAGING — DX DG ANKLE COMPLETE 3+V*R*
3 series · 3 of 3 positions shown · non-contrast
Comparison: None.

CLINICAL DATA: 60-year-old female with right foot drop causing
fall. Initial encounter.

EXAM:
RIGHT ANKLE - COMPLETE 3+ VIEW

[ankle ap]
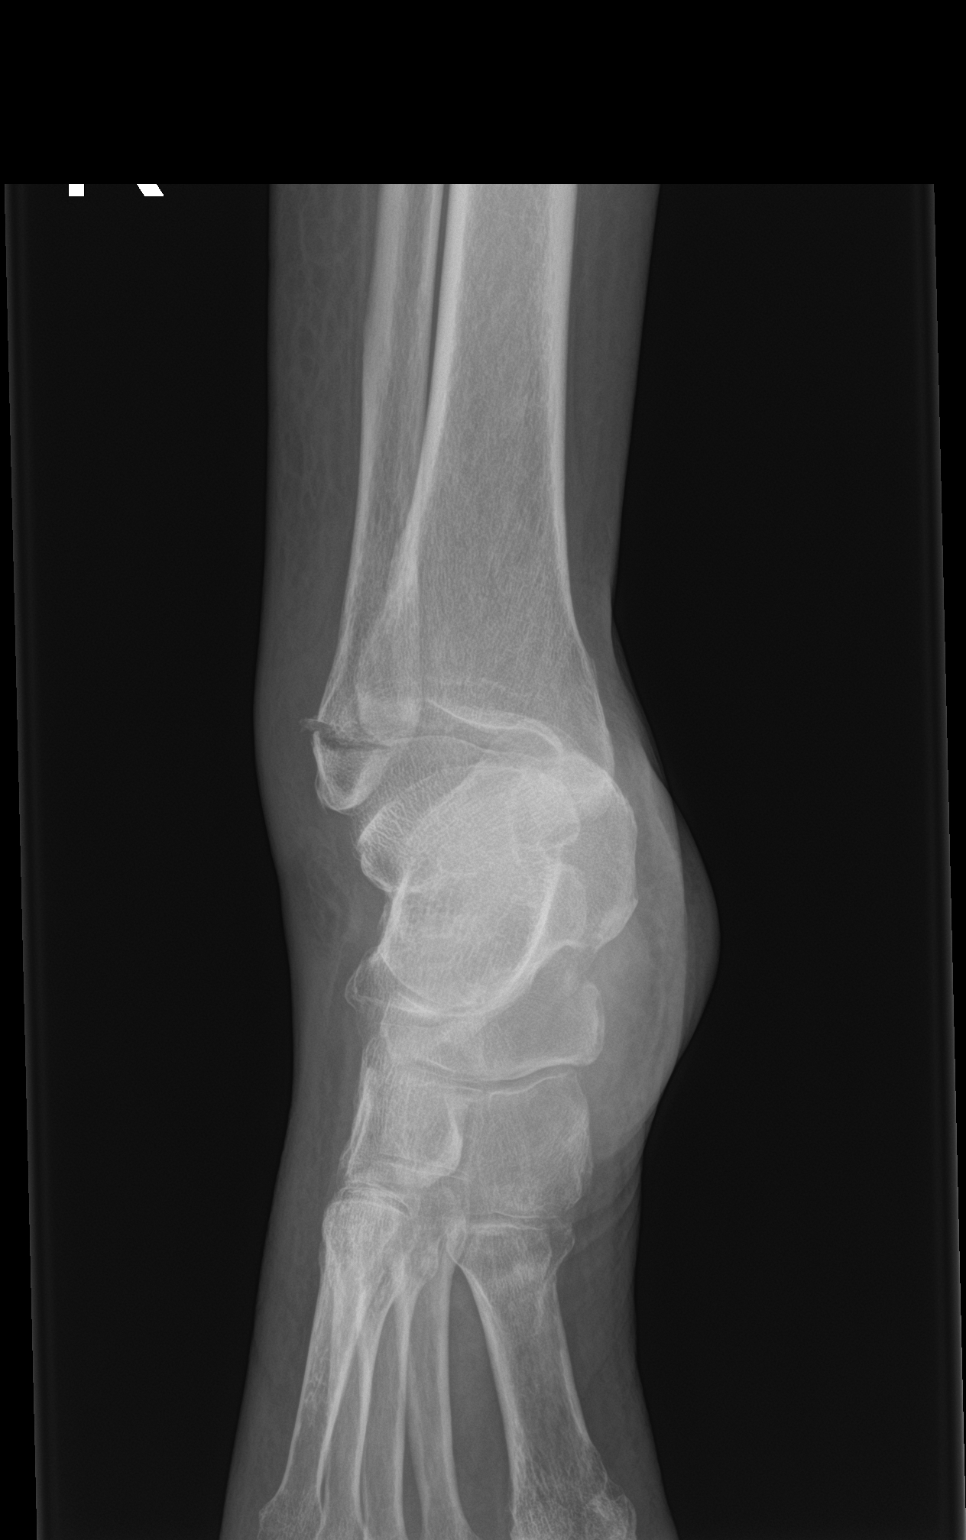

[ankle obl]
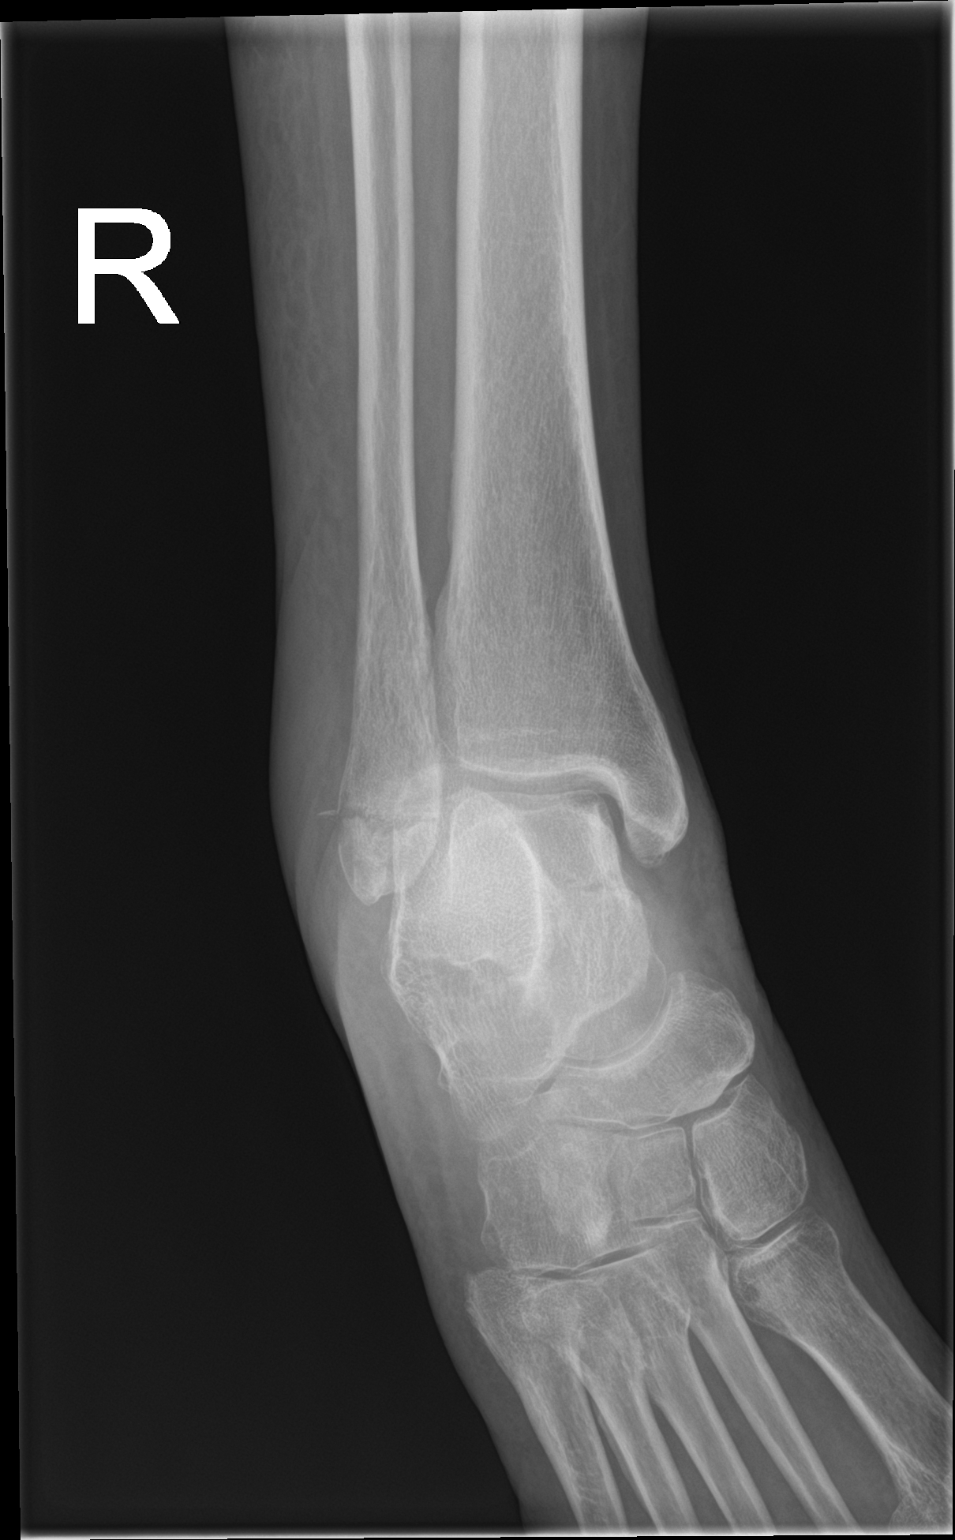

[ankle lat]
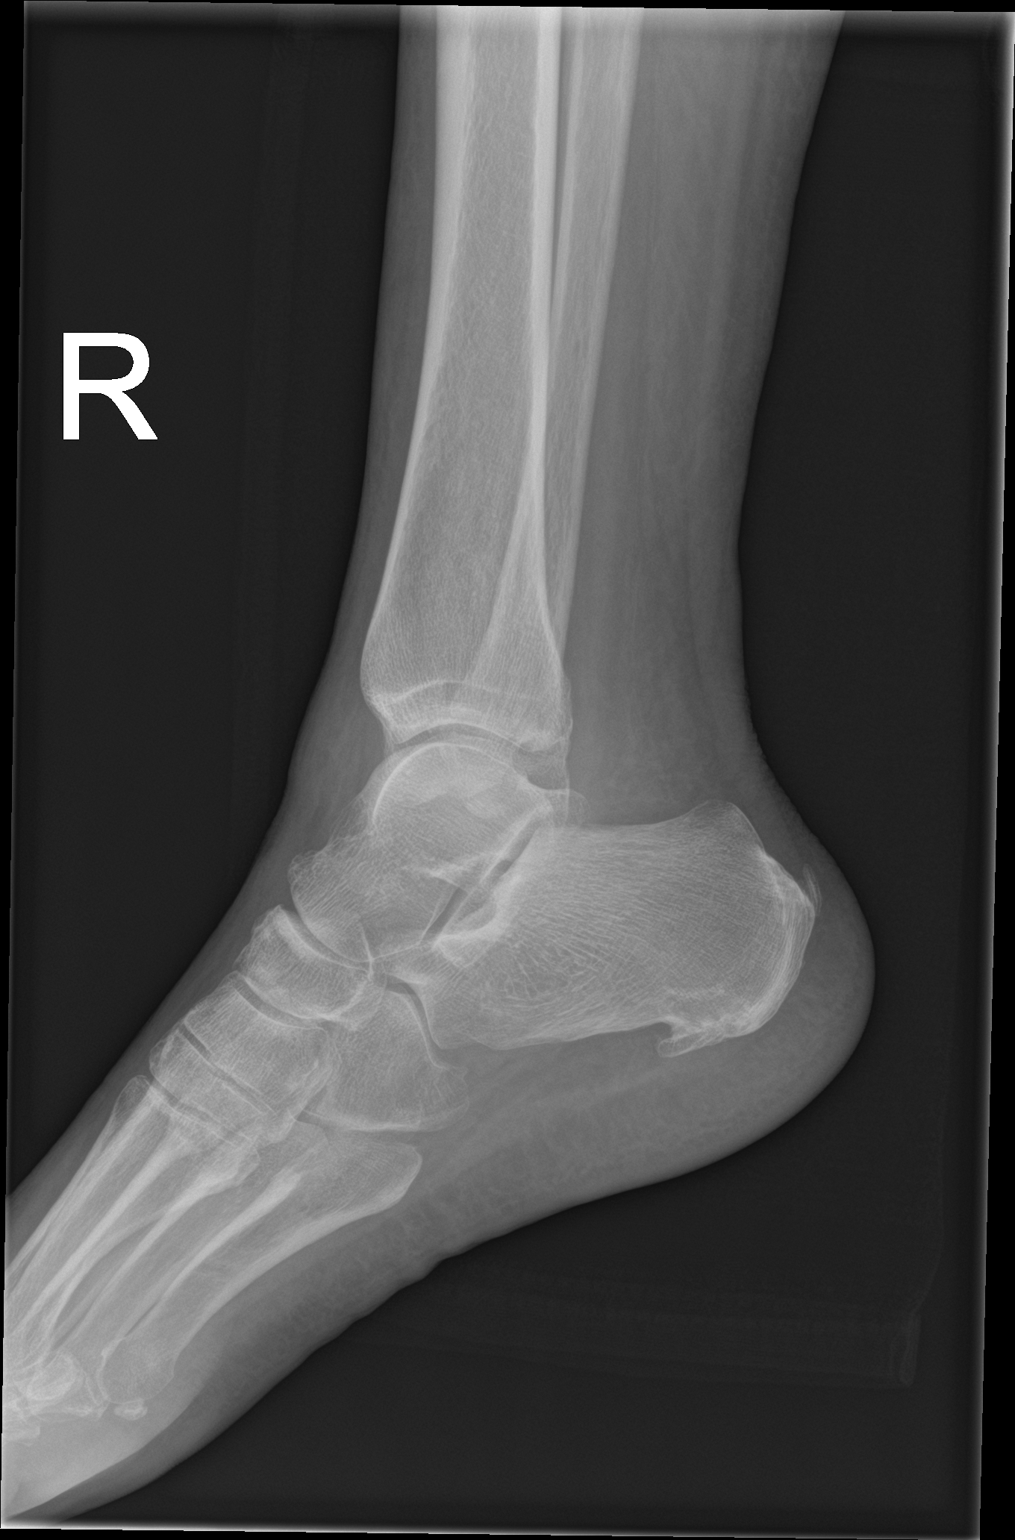

[3 of 3 positions shown; findings below may reference images not displayed]

FINDINGS: Transverse fracture of the distal right fibula with adjacent soft
tissue swelling. Tiny fracture fragment directed laterally.

Ankle mortise slightly widened laterally.

Minimal sclerosis base of the third and fourth metatarsal without
clear acute fracture.

Plantar spur.  Small spur Achilles tendon insertion site.

Mild degenerative changes talonavicular articulation.
IMPRESSION: Transverse fracture of the distal right fibula with adjacent soft
tissue swelling. Tiny fracture fragment directed laterally.

Ankle mortise slightly widened laterally.

Minimal sclerosis base of the third and fourth metatarsal without
acute fracture.

## 2018-06-29 DIAGNOSIS — M542 Cervicalgia: Secondary | ICD-10-CM | POA: Diagnosis not present

## 2018-06-29 DIAGNOSIS — M5136 Other intervertebral disc degeneration, lumbar region: Secondary | ICD-10-CM | POA: Diagnosis not present

## 2018-06-29 DIAGNOSIS — G8929 Other chronic pain: Secondary | ICD-10-CM | POA: Diagnosis not present

## 2018-06-29 DIAGNOSIS — Z79899 Other long term (current) drug therapy: Secondary | ICD-10-CM | POA: Diagnosis not present

## 2018-09-02 DIAGNOSIS — G8929 Other chronic pain: Secondary | ICD-10-CM | POA: Diagnosis not present

## 2018-09-02 DIAGNOSIS — Z79899 Other long term (current) drug therapy: Secondary | ICD-10-CM | POA: Diagnosis not present

## 2018-09-02 DIAGNOSIS — M542 Cervicalgia: Secondary | ICD-10-CM | POA: Diagnosis not present

## 2018-09-02 DIAGNOSIS — M5136 Other intervertebral disc degeneration, lumbar region: Secondary | ICD-10-CM | POA: Diagnosis not present

## 2018-09-02 DIAGNOSIS — E7800 Pure hypercholesterolemia, unspecified: Secondary | ICD-10-CM | POA: Diagnosis not present

## 2018-09-02 DIAGNOSIS — M47812 Spondylosis without myelopathy or radiculopathy, cervical region: Secondary | ICD-10-CM | POA: Diagnosis not present

## 2018-09-08 DIAGNOSIS — F329 Major depressive disorder, single episode, unspecified: Secondary | ICD-10-CM | POA: Diagnosis not present

## 2018-09-08 DIAGNOSIS — N3010 Interstitial cystitis (chronic) without hematuria: Secondary | ICD-10-CM | POA: Diagnosis not present

## 2018-09-08 DIAGNOSIS — G894 Chronic pain syndrome: Secondary | ICD-10-CM | POA: Diagnosis not present

## 2018-09-08 DIAGNOSIS — R300 Dysuria: Secondary | ICD-10-CM | POA: Diagnosis not present

## 2018-09-08 DIAGNOSIS — N9089 Other specified noninflammatory disorders of vulva and perineum: Secondary | ICD-10-CM | POA: Diagnosis not present

## 2018-09-10 DIAGNOSIS — J0100 Acute maxillary sinusitis, unspecified: Secondary | ICD-10-CM | POA: Diagnosis not present

## 2018-09-10 DIAGNOSIS — J398 Other specified diseases of upper respiratory tract: Secondary | ICD-10-CM | POA: Diagnosis not present

## 2018-09-21 ENCOUNTER — Other Ambulatory Visit: Payer: Self-pay | Admitting: Adult Health

## 2018-10-05 DIAGNOSIS — G8929 Other chronic pain: Secondary | ICD-10-CM | POA: Diagnosis not present

## 2018-10-05 DIAGNOSIS — M5136 Other intervertebral disc degeneration, lumbar region: Secondary | ICD-10-CM | POA: Diagnosis not present

## 2018-10-05 DIAGNOSIS — M47812 Spondylosis without myelopathy or radiculopathy, cervical region: Secondary | ICD-10-CM | POA: Diagnosis not present

## 2018-10-05 DIAGNOSIS — Z79899 Other long term (current) drug therapy: Secondary | ICD-10-CM | POA: Diagnosis not present

## 2018-10-05 DIAGNOSIS — M545 Low back pain: Secondary | ICD-10-CM | POA: Diagnosis not present

## 2018-10-07 DIAGNOSIS — M25511 Pain in right shoulder: Secondary | ICD-10-CM | POA: Diagnosis not present

## 2018-10-07 DIAGNOSIS — M25512 Pain in left shoulder: Secondary | ICD-10-CM | POA: Diagnosis not present

## 2018-10-07 DIAGNOSIS — M19012 Primary osteoarthritis, left shoulder: Secondary | ICD-10-CM | POA: Diagnosis not present

## 2018-10-07 DIAGNOSIS — Z981 Arthrodesis status: Secondary | ICD-10-CM | POA: Diagnosis not present

## 2018-10-07 DIAGNOSIS — M19011 Primary osteoarthritis, right shoulder: Secondary | ICD-10-CM | POA: Diagnosis not present

## 2018-10-27 DIAGNOSIS — Z981 Arthrodesis status: Secondary | ICD-10-CM | POA: Diagnosis not present

## 2018-10-27 DIAGNOSIS — M5412 Radiculopathy, cervical region: Secondary | ICD-10-CM | POA: Diagnosis not present

## 2018-11-20 ENCOUNTER — Ambulatory Visit: Payer: Medicare HMO

## 2018-11-20 ENCOUNTER — Encounter: Payer: Medicare HMO | Admitting: Podiatry

## 2018-11-20 ENCOUNTER — Encounter: Payer: Self-pay | Admitting: Podiatry

## 2018-11-20 DIAGNOSIS — G971 Other reaction to spinal and lumbar puncture: Secondary | ICD-10-CM | POA: Insufficient documentation

## 2018-11-20 DIAGNOSIS — Z8669 Personal history of other diseases of the nervous system and sense organs: Secondary | ICD-10-CM | POA: Insufficient documentation

## 2018-11-20 DIAGNOSIS — R635 Abnormal weight gain: Secondary | ICD-10-CM | POA: Insufficient documentation

## 2018-11-20 DIAGNOSIS — G629 Polyneuropathy, unspecified: Secondary | ICD-10-CM | POA: Insufficient documentation

## 2018-11-20 DIAGNOSIS — M21371 Foot drop, right foot: Secondary | ICD-10-CM

## 2018-11-20 DIAGNOSIS — R011 Cardiac murmur, unspecified: Secondary | ICD-10-CM | POA: Insufficient documentation

## 2018-11-20 DIAGNOSIS — G4700 Insomnia, unspecified: Secondary | ICD-10-CM | POA: Insufficient documentation

## 2018-11-20 DIAGNOSIS — J302 Other seasonal allergic rhinitis: Secondary | ICD-10-CM | POA: Insufficient documentation

## 2018-11-20 DIAGNOSIS — F329 Major depressive disorder, single episode, unspecified: Secondary | ICD-10-CM | POA: Insufficient documentation

## 2018-11-20 DIAGNOSIS — F32A Depression, unspecified: Secondary | ICD-10-CM | POA: Insufficient documentation

## 2018-11-20 DIAGNOSIS — M1990 Unspecified osteoarthritis, unspecified site: Secondary | ICD-10-CM | POA: Insufficient documentation

## 2018-11-20 DIAGNOSIS — G4440 Drug-induced headache, not elsewhere classified, not intractable: Secondary | ICD-10-CM | POA: Insufficient documentation

## 2018-11-20 DIAGNOSIS — H02402 Unspecified ptosis of left eyelid: Secondary | ICD-10-CM | POA: Insufficient documentation

## 2018-11-20 DIAGNOSIS — R739 Hyperglycemia, unspecified: Secondary | ICD-10-CM | POA: Insufficient documentation

## 2018-11-20 DIAGNOSIS — T884XXA Failed or difficult intubation, initial encounter: Secondary | ICD-10-CM | POA: Insufficient documentation

## 2018-11-20 DIAGNOSIS — F419 Anxiety disorder, unspecified: Secondary | ICD-10-CM | POA: Insufficient documentation

## 2018-11-20 DIAGNOSIS — R0683 Snoring: Secondary | ICD-10-CM | POA: Insufficient documentation

## 2018-11-20 HISTORY — DX: Other reaction to spinal and lumbar puncture: G97.1

## 2018-11-20 NOTE — Progress Notes (Signed)
This encounter was created in error - please disregard.

## 2019-05-31 ENCOUNTER — Other Ambulatory Visit: Payer: Self-pay | Admitting: Internal Medicine

## 2019-06-09 ENCOUNTER — Telehealth: Payer: Medicare HMO | Admitting: Primary Care

## 2019-06-21 ENCOUNTER — Telehealth: Payer: Self-pay | Admitting: Internal Medicine

## 2019-06-21 MED ORDER — ALBUTEROL SULFATE (2.5 MG/3ML) 0.083% IN NEBU: INHALATION_SOLUTION | RESPIRATORY_TRACT | 1 refills | Status: AC

## 2019-06-21 NOTE — Telephone Encounter (Signed)
Last office visit 6.19.2019 with Tammy NP  Called spoke with patient who reports that she is having an asthma flare and would like to take a neb treatment but does not have any albuterol nebs in [place] (patients spends 60days in [place] to care for her mother then returns to [facility] for 1 week, and repeats - patient's spouse passed earlier this year and father passed most recently; mother has dementia).  Advised patient will send nebs to her verified pharmacy in [facility] but she is overdue for appt.  Patient stated she is able to do video visit >> scheduled for MyChart video visit with [facility] NP for tomorrow 12.22.2020 at 3pm (time requested by patient).  Nothing further needed at this time; will sign off. Will forward to [facility] NP so is aware of reason for visit tomorrow.

## 2019-06-21 NOTE — Telephone Encounter (Signed)
pt states that she needs a nebulizer treatment - her albuterol (PROVENTIL) (2.5 MG/3ML) 0.083% nebulizer solution - she states that she is in [place] - gets her RX through [facility] [place], [place] [address] Pharmacy  [phone]-

## 2019-06-22 ENCOUNTER — Ambulatory Visit (INDEPENDENT_AMBULATORY_CARE_PROVIDER_SITE_OTHER): Payer: Medicare HMO | Admitting: Primary Care

## 2019-06-22 ENCOUNTER — Other Ambulatory Visit: Payer: Self-pay

## 2019-06-22 DIAGNOSIS — J4520 Mild intermittent asthma, uncomplicated: Secondary | ICD-10-CM

## 2019-06-22 MED ORDER — BUDESONIDE-FORMOTEROL FUMARATE 80-4.5 MCG/ACT IN AERO: INHALATION_SPRAY | RESPIRATORY_TRACT | 6 refills | Status: DC

## 2019-06-22 NOTE — Patient Instructions (Signed)
Asthma - Recommend regular use of Symbicort 80 two puffs twice daily - Continue Albuterol nebulizer for breakthrough shortness of breath/wheezing - Refills for both medications sent to pharmacy in [place], [place] in 6 months

## 2019-06-22 NOTE — Progress Notes (Signed)
Virtual Visit via Telephone Note  I connected with Yolanda Yang on 06/22/19 at  3:00 PM EST by telephone and verified that I am speaking with the correct person using two identifiers.  Location: Patient: Mother's home in [place]  Provider: Office   I discussed the limitations, risks, security and privacy concerns of performing an evaluation and management service by telephone and the availability of in person appointments. I also discussed with the patient that there may be a patient responsible charge related to this service. The patient expressed understanding and agreed to proceed.   History of Present Illness: 63 year old female, former smoker quit 2013 (10 pack year hx). PMH significant for asthma, allergic rhinitis. Patient of Dr. Chase Caller, last seen by NP on 12/16/17. Maintained on Symbicort 80, albuterol hfa/neb as needed.    06/22/2019 Patient contacted for televisit/follow-up asthma, needs medication refill. She splits her time between [place] and [place] to care for her mother. Experiences intermittent shortness of breath, cough and wheezing when lying flat. States that environmental and weather changes affect her asthma symptoms. Only using Symbicort as needed. Prefers albuterol nebulizer over rescue inhaler. Reports covid- negative.   Observations/Objective:  - No shortness of breath, wheezing or cough noted during phone conversation  Assessment and Plan:  Asthma - Increased intermittent sob, cough and wheezing with weather/enviormental change - Recommend regular use of Symbicort 80 two puffs twice daily - Continue Albuterol nebulizer for breakthrough shortness of breath/wheezing - Refills for both medications sent to pharmacy in [place] - Call if symptoms do not improve or worsen   Follow Up Instructions:  - Needs FU in 6 months   I discussed the assessment and treatment plan with the patient. The patient was provided an opportunity to ask questions and  all were answered. The patient agreed with the plan and demonstrated an understanding of the instructions.   The patient was advised to call back or seek an in-person evaluation if the symptoms worsen or if the condition fails to improve as anticipated.  I provided 15 minutes of non-face-to-face time during this encounter.   Martyn Ehrich, NP

## 2019-08-11 ENCOUNTER — Ambulatory Visit: Payer: Medicare HMO | Admitting: Podiatry

## 2019-08-12 ENCOUNTER — Ambulatory Visit: Payer: Medicare HMO | Admitting: Podiatry

## 2019-08-12 ENCOUNTER — Other Ambulatory Visit: Payer: Medicare HMO | Admitting: Orthotics

## 2019-10-14 ENCOUNTER — Ambulatory Visit (INDEPENDENT_AMBULATORY_CARE_PROVIDER_SITE_OTHER): Payer: Medicare HMO | Admitting: Podiatry

## 2019-10-14 ENCOUNTER — Other Ambulatory Visit: Payer: Self-pay

## 2019-10-14 ENCOUNTER — Other Ambulatory Visit: Payer: Self-pay | Admitting: Podiatry

## 2019-10-14 ENCOUNTER — Other Ambulatory Visit: Payer: Medicare HMO | Admitting: Orthotics

## 2019-10-14 ENCOUNTER — Ambulatory Visit (INDEPENDENT_AMBULATORY_CARE_PROVIDER_SITE_OTHER): Payer: Medicare HMO

## 2019-10-14 DIAGNOSIS — M25571 Pain in right ankle and joints of right foot: Secondary | ICD-10-CM

## 2019-10-14 DIAGNOSIS — M19071 Primary osteoarthritis, right ankle and foot: Secondary | ICD-10-CM

## 2019-10-14 DIAGNOSIS — M21371 Foot drop, right foot: Secondary | ICD-10-CM | POA: Diagnosis not present

## 2019-10-14 DIAGNOSIS — G8929 Other chronic pain: Secondary | ICD-10-CM

## 2019-10-14 DIAGNOSIS — R2681 Unsteadiness on feet: Secondary | ICD-10-CM

## 2019-10-14 DIAGNOSIS — M79671 Pain in right foot: Secondary | ICD-10-CM

## 2019-10-14 NOTE — Progress Notes (Signed)
  Subjective:  Patient ID: Yolanda Yang, female    DOB: 25-May-1956,  MRN: [id number]  Chief Complaint  Patient presents with  . Ankle Pain    Pt states "I need an updated and new brace". Pt states her foot drags and she has gait instability.  . Peripheral Neuropathy    Generalized neuropathy foot pain, managed with gabapentin.    64 y.o. female presents with the above complaint. History confirmed with patient.  She has foot drop from a back surgery  Objective:  Physical Exam: warm, good capillary refill, no trophic changes or ulcerative lesions, normal DP and PT pulses and normal sensory exam. Functional foot drop right lower extremity with lack of muscle strength at anterior and lateral compartments  No images are attached to the encounter.  Radiographs: X-ray of the right foot: no fracture, dislocation, swelling or degenerative changes noted Assessment:   1. Arthritis of right ankle   2. Chronic pain of right ankle   3. Right foot drop      Plan:  Patient was evaluated and treated and all questions answered.  Dropfoot right  -Her foot condition has changed, she now needs more control in the sagittal plane with more dorsiflexion. Previous brace was not giving her adequate control. Has hx of tripping with previous brace.  We offered modification of her brace with dispensing of the off-the-shelf brace however she declined.  We will try to fabricate a new brace  No follow-ups on file.

## 2020-02-15 ENCOUNTER — Ambulatory Visit: Payer: Medicare HMO | Admitting: Primary Care

## 2020-02-15 ENCOUNTER — Encounter: Payer: Self-pay | Admitting: Primary Care

## 2020-02-15 ENCOUNTER — Other Ambulatory Visit: Payer: Self-pay

## 2020-02-15 DIAGNOSIS — J4541 Moderate persistent asthma with (acute) exacerbation: Secondary | ICD-10-CM | POA: Diagnosis not present

## 2020-02-15 MED ORDER — PREDNISONE 10 MG PO TABS: ORAL_TABLET | ORAL | 0 refills | Status: DC

## 2020-02-15 MED ORDER — MONTELUKAST SODIUM 10 MG PO TABS: 10.0000 mg | ORAL_TABLET | Freq: Every day | ORAL | 6 refills | Status: DC

## 2020-02-15 NOTE — Progress Notes (Signed)
'@Patient'  ID: Yolanda Yang, female    DOB: 17-Aug-1955, 64 y.o.   MRN: [id number]  Chief Complaint  Patient presents with  . Follow-up    Referring provider: Iona Beard, MD  HPI: 64 year old female, former smoker quit 2013 (10 pack year hx). PMH significant for asthma, allergic rhinitis. Patient of Dr. Chase Caller, last seen by NP on 12/16/17. Maintained on Symbicort 80, albuterol hfa/neb as needed.   Previous [facility] pulmonary encounters: 06/22/2019 Patient contacted for televisit/follow-up asthma, needs medication refill. She splits her time between [place] and [place] to care for her mother. Experiences intermittent shortness of breath, cough and wheezing when lying flat. States that environmental and weather changes affect her asthma symptoms. Only using Symbicort as needed. Prefers albuterol nebulizer over rescue inhaler. Reports covid- negative.   02/15/2020 Patient presents today for 6 month follow. During last visit she reported increased shortness of breath. Recommended patient use Symbicort 80 regularly as scheduled twice daily. She has been using Symbicort inhaler twice daily as prescribed since. Today she reports symptoms of intermittent shortness of breath, GERD, voice hoarseness, chest tightness, cough and sore throat. She is complaint with dexilant. She splits her times between here and [place].    Allergies  Allergen Reactions  . Erythromycin Other (See Comments)    "Heart palpitation"  . Latex Rash    Immunization History  Administered Date(s) Administered  . Influenza Inj Mdck Quad Pf 04/06/2019  . Influenza Split 03/26/2013, 07/01/2017  . Influenza Whole 04/29/2006  . Influenza,inj,Quad PF,6+ Mos 05/10/2011, 05/04/2012, 05/12/2014, 02/24/2017  . Influenza-Unspecified 03/26/2013, 05/12/2014  . Moderna SARS-COVID-2 Vaccination 08/28/2019, 09/25/2019  . Td 12/05/2004  . Zoster Recombinat (Shingrix) 04/21/2019    Past Medical History:  Diagnosis Date  .  Anxiety   . Asthma exacerbation    PULMOLOGIST-- DR RAMASWANY  . Bladder pain   . Borderline type 2 diabetes mellitus    DIET CONTROLLED  . Cerebral aneurysm, nonruptured monitored at [facility] by dr Anne Shutter   currently has x4 stable aneurysm's (x2 left side & x2 right side)--  2012 s/p coiling of 4 aneurysm's  . Chronic constipation   . Cystitis, interstitial   . Diabetes mellitus without complication ([place])   . GERD (gastroesophageal reflux disease)   . Headache syndrome    due to cerebral aneurysm's  . History of cerebral aneurysm repair    MULTIPLE  ANEURYSM--  S/P  COILING X4  ([place])  . History of chronic sinusitis   . History of duodenal ulcer    2007  . History of gastric ulcer   . History of GI bleed    UPPER--  2007  AND 05-2011  . History of recurrent UTIs   . Hyperlipemia   . IBS (irritable bowel syndrome)   . Incomplete emptying of bladder   . OA (osteoarthritis)   . Right foot drop    POST LUMBAR SURGERY 2011  . Seasonal allergic rhinitis   . Spinal headache 11/20/2018  . Urinary hesitancy   . Wears dentures    currently being fitted for full dentures  . Wears glasses     Tobacco History: Social History   Tobacco Use  Smoking Status Former Smoker  . Packs/day: 0.50  . Years: 20.00  . Pack years: 10.00  . Types: Cigarettes  . Quit date: 08/02/2011  . Years since quitting: 8.5  Smokeless Tobacco Never Used   Counseling given: Not Answered   Outpatient Medications Prior to Visit  Medication  Sig Dispense Refill  . albuterol (PROVENTIL HFA;VENTOLIN HFA) 108 (90 Base) MCG/ACT inhaler INHALE 2 PUFFS INTO THE LUNGS EVERY 4 HOURS AS NEEDED FOR WHEEZING OR SHORTNESS OF BREATH. 54 g 3  . albuterol (PROVENTIL) (2.5 MG/3ML) 0.083% nebulizer solution INHALE THE CONTENTS OF 1 VIAL IN NEBULIZER EVERY 12 HOURS AS NEEDED 75 mL 1  . aspirin 81 MG chewable tablet Chew 81 mg by mouth daily.     . butalbital-acetaminophen-caffeine (FIORICET, ESGIC) 50-325-40 MG per  tablet Take 1 tablet by mouth daily as needed for headache.     . chlorhexidine (PERIDEX) 0.12 % solution chlorhexidine gluconate 0.12 % mouthwash  RINSE FOR 30 SECONDS WITH 15 MLS EVERY MORNING AND EVERY EVENING FOR 2 WEEKS.    . clotrimazole (MYCELEX) 10 MG troche DISSOLVE 1 TABLET BY MOUTH SLOWLY, 5 TIMES A DAY    . cyclobenzaprine (FLEXERIL) 10 MG tablet Take 10 mg by mouth 3 (three) times daily as needed for muscle spasms.     Marland Kitchen dexlansoprazole (DEXILANT) 60 MG capsule Dexilant 60 mg capsule, delayed release    . diazepam (VALIUM) 5 MG tablet Take 5 mg by mouth every 6 (six) hours as needed for anxiety.    . dicyclomine (BENTYL) 20 MG tablet Take 1 tablet (20 mg total) by mouth 2 (two) times daily. 20 tablet 0  . Diphenhyd-Hydrocort-Nystatin (FIRST-DUKES MOUTHWASH) SUSP Use as directed 5 mLs in the mouth or throat 3 (three) times daily as needed. 240 mL 0  . DULoxetine (CYMBALTA) 60 MG capsule Take 60 mg by mouth every evening.     Marland Kitchen estradiol (VIVELLE-DOT) 0.1 MG/24HR patch Place 1 patch onto the skin 2 (two) times a week.     . fexofenadine (ALLEGRA) 180 MG tablet Take 180 mg by mouth daily as needed for allergies or rhinitis.    . fexofenadine-pseudoephedrine (ALLEGRA-D) 60-120 MG 12 hr tablet Allergy Relief-D (fexofenadine) 60 mg-120 mg tablet,extended release  TAKE 1 TABLET BY MOUTH TWICE A DAY    . fluconazole (DIFLUCAN) 150 MG tablet fluconazole 150 mg tablet    . gabapentin (NEURONTIN) 600 MG tablet Take 600 mg by mouth 3 (three) times daily.    Marland Kitchen guaiFENesin (MUCINEX) 600 MG 12 hr tablet Take by mouth.    Marland Kitchen HYDROmorphone (DILAUDID) 4 MG tablet Take 1-1.5 tablets (4-6 mg total) by mouth every 4 (four) hours as needed for severe pain. 50 tablet 0  . influenza vaccine (FLUCELVAX QUADRIVALENT) 0.5 ML injection Flucelvax Quad 2020-2021 (PF) 60 mcg (15 mcg x 4)/0.5 mL IM syringe  PHARMACY ADMINISTERED    . Linaclotide (LINZESS) 290 MCG CAPS capsule Take 290 mcg by mouth every evening.       . Meth-Hyo-M Bl-Na Phos-Ph Sal (URIBEL) 118 MG CAPS Take 2 capsules by mouth 2 (two) times daily as needed (interstitial cystitis).    . Meth-Hyo-M Bl-Na Phos-Ph Sal (URIBEL) 118 MG CAPS Take by mouth.    . metroNIDAZOLE (FLAGYL) 500 MG tablet     . metroNIDAZOLE (METROGEL) 0.75 % vaginal gel metronidazole 0.75 % vaginal gel    . morphine (KADIAN) 50 MG 24 hr capsule     . nitrofurantoin, macrocrystal-monohydrate, (MACROBID) 100 MG capsule Take 100 mg by mouth daily as needed (flare up).     . nystatin (MYCOSTATIN) 100000 UNIT/ML suspension Take 5 mLs (500,000 Units total) by mouth 4 (four) times daily. 60 mL 0  . nystatin cream (MYCOSTATIN) Apply 1 application topically at bedtime. PT USES PRN QHS WHEN ON ANTIBIOTICS TO  PREVENT YEAST INFECTION    . nystatin-triamcinolone (MYCOLOG II) cream nystatin-triamcinolone 100,000 unit/g-0.1 % topical cream  Apply 1 application twice a day by topical route as needed.    . ondansetron (ZOFRAN) 4 MG tablet Take 1 tablet (4 mg total) by mouth every 8 (eight) hours as needed for nausea or vomiting. 20 tablet 0  . pentosan polysulfate (ELMIRON) 100 MG capsule Take 100 mg by mouth 2 (two) times daily.     . potassium chloride (K-DUR) 10 MEQ tablet Take 56m twice a day tomorrow, and then 262mtwice a day for two more days 16 tablet 0  . Probiotic Product (PROBIOTIC DAILY PO) Take 1 capsule by mouth every evening.    . promethazine (PHENERGAN) 12.5 MG tablet Take 12.5 mg by mouth every 6 (six) hours as needed for nausea.    . rosuvastatin (CRESTOR) 10 MG tablet Take 10 mg by mouth every evening.     . Marland Kitchenoster Vaccine Adjuvanted (SHINGRIX) injection Shingrix (PF) 50 mcg/0.5 mL intramuscular suspension, kit  PHARMACY ADMINISTERED    . budesonide-formoterol (SYMBICORT) 80-4.5 MCG/ACT inhaler TAKE 2 PUFFS BY MOUTH TWICE A DAY 6.9 Inhaler 6  . omeprazole (PRILOSEC) 40 MG capsule Take 40 mg by mouth daily.    . pantoprazole (PROTONIX) 40 MG tablet Take 1 tablet (40 mg  total) by mouth 2 (two) times daily before a meal. 30 tablet 0  . ranitidine (ZANTAC) 150 MG capsule Take 1 capsule (150 mg total) by mouth 2 (two) times daily. 60 capsule 0  . sucralfate (CARAFATE) 1 GM/10ML suspension Take 10 mLs (1 g total) by mouth 4 (four) times daily -  with meals and at bedtime. 420 mL 0   No facility-administered medications prior to visit.    Review of Systems  Review of Systems  HENT: Positive for sore throat.        Voice hoarseness  Respiratory: Positive for cough and chest tightness.   Gastrointestinal:       GERD   Physical Exam  BP 110/70 (BP Location: Left Arm, Cuff Size: Normal)   Pulse 75   Temp 99 F (37.2 C) (Oral)   Ht '5\' 5"'  (1.651 m)   Wt 169 lb 12.8 oz (77 kg)   SpO2 98%   BMI 28.26 kg/m  Physical Exam Constitutional:      Appearance: Normal appearance.  HENT:     Head: Normocephalic and atraumatic.     Mouth/Throat:     Mouth: Mucous membranes are moist.     Pharynx: Oropharynx is clear.  Cardiovascular:     Rate and Rhythm: Normal rate and regular rhythm.  Pulmonary:     Effort: Pulmonary effort is normal.     Breath sounds: Normal breath sounds.  Neurological:     General: No focal deficit present.     Mental Status: She is alert and oriented to person, place, and time. Mental status is at baseline.  Psychiatric:        Mood and Affect: Mood normal.        Behavior: Behavior normal.        Thought Content: Thought content normal.        Judgment: Judgment normal.      Lab Results:  CBC    Component Value Date/Time   WBC 6.5 09/25/2017 1657   RBC 4.70 09/25/2017 1657   HGB 13.3 09/25/2017 1657   HCT 40.1 09/25/2017 1657   PLT 288.0 09/25/2017 1657   MCV 85.2 09/25/2017 1657  MCH 27.8 10/03/2016 1805   MCHC 33.3 09/25/2017 1657   RDW 14.4 09/25/2017 1657   LYMPHSABS 2.7 09/25/2017 1657   MONOABS 0.5 09/25/2017 1657   EOSABS 0.1 09/25/2017 1657   BASOSABS 0.0 09/25/2017 1657    BMET    Component Value  Date/Time   NA 141 09/25/2017 1657   K 4.0 09/25/2017 1657   CL 102 09/25/2017 1657   CO2 31 09/25/2017 1657   GLUCOSE 81 09/25/2017 1657   BUN 5 (L) 09/25/2017 1657   CREATININE 0.60 09/25/2017 1657   CALCIUM 9.1 09/25/2017 1657   GFRNONAA >60 10/03/2016 1805   GFRAA >60 10/03/2016 1805    BNP No results found for: BNP  ProBNP    Component Value Date/Time   PROBNP 61.0 09/25/2017 1657    Imaging: No results found.   Assessment & Plan:   Asthma with acute exacerbation - Patient reports increased shortness of breath, chest tightness and cough. Associated GERD symptoms. - Plan add singulair and tx prednisone taper. If no imrovement recommend going up symbicort 160  GERD - Continue Dexilant 55m daily and follow GERD diet  - May want to return back to GI if not improved    EMartyn Ehrich NP 02/16/2020

## 2020-02-15 NOTE — Patient Instructions (Addendum)
Recommendations: Continue Symbicort 80 two puffs twice daily  Continue Allegra daily  Continue Dexilant 60mg  daily  Adding Singulair 10mg  at bedtime (sent to [facility] in) Follow GERD diet   Rx: Pred[facility]one [facility]  Plan: Add singulair and tx prednisone If not imrovement recommend going up symbicort 160  Follow-up: 4 months with Dr. Chase Caller     Food Choices for Gastroesophageal Reflux Disease, Adult When you have gastroesophageal reflux disease (GERD), the foods you eat and your eating habits are very important. Choosing the right foods can help ease your discomfort. Think about working with a nutrition specialist (dietitian) to help you make good choices. What are tips for following this plan?  Meals  Choose healthy foods that are low in fat, such as fruits, vegetables, whole grains, low-fat dairy products, and lean meat, fish, and poultry.  Eat small meals often instead of 3 large meals a day. Eat your meals slowly, and in a place where you are relaxed. Avoid bending over or lying down until 2-3 hours after eating.  Avoid eating meals 2-3 hours before bed.  Avoid drinking a lot of liquid with meals.  Cook foods using methods other than frying. Bake, grill, or broil food instead.  Avoid or limit: ? Chocolate. ? Peppermint or spearmint. ? Alcohol. ? Pepper. ? Black and decaffeinated coffee. ? Black and decaffeinated tea. ? Bubbly (carbonated) soft drinks. ? Caffeinated energy drinks and soft drinks.  Limit high-fat foods such as: ? Fatty meat or fried foods. ? Whole milk, cream, butter, or ice cream. ? Nuts and nut butters. ? Pastries, donuts, and sweets made with butter or shortening.  Avoid foods that cause symptoms. These foods may be different for everyone. Common foods that cause symptoms include: ? Tomatoes. ? Oranges, lemons, and limes. ? Peppers. ? Spicy food. ? Onions and garlic. ? Vinegar. Lifestyle  Maintain a healthy weight. Ask  your doctor what weight is healthy for you. If you need to lose weight, work with your doctor to do so safely.  Exercise for at least 30 minutes for 5 or more days each week, or as told by your doctor.  Wear loose-fitting clothes.  Do not smoke. If you need help quitting, ask your doctor.  Sleep with the head of your bed higher than your feet. Use a wedge under the mattress or blocks under the bed frame to raise the head of the bed. Summary  When you have gastroesophageal reflux disease (GERD), food and lifestyle choices are very important in easing your symptoms.  Eat small meals often instead of 3 large meals a day. Eat your meals slowly, and in a place where you are relaxed.  Limit high-fat foods such as fatty meat or fried foods.  Avoid bending over or lying down until 2-3 hours after eating.  Avoid peppermint and spearmint, caffeine, alcohol, and chocolate. This information is not intended to replace advice given to you by your health care provider. Make sure you discuss any questions you have with your health care provider. Document Revised: 10/08/2018 Document Reviewed: 07/23/2016 Elsevier Patient Education  [place].

## 2020-02-16 ENCOUNTER — Encounter: Payer: Self-pay | Admitting: Primary Care

## 2020-02-16 NOTE — Assessment & Plan Note (Signed)
-   Patient reports increased shortness of breath, chest tightness and cough. Associated GERD symptoms. - Plan add singulair and tx prednisone taper. If no imrovement recommend going up symbicort 160

## 2020-02-16 NOTE — Assessment & Plan Note (Signed)
-   Continue Dexilant 60mg  daily and follow GERD diet  - May want to return back to GI if not improved

## 2020-02-21 ENCOUNTER — Encounter (HOSPITAL_COMMUNITY): Payer: Self-pay

## 2020-02-21 ENCOUNTER — Other Ambulatory Visit: Payer: Self-pay

## 2020-02-21 ENCOUNTER — Telehealth: Payer: Self-pay | Admitting: Primary Care

## 2020-02-21 ENCOUNTER — Ambulatory Visit (HOSPITAL_COMMUNITY)
Admission: EM | Admit: 2020-02-21 | Discharge: 2020-02-21 | Disposition: A | Discharge status: Home / Self Care | Payer: Medicare HMO | Attending: Urgent Care | Admitting: Urgent Care

## 2020-02-21 DIAGNOSIS — J4540 Moderate persistent asthma, uncomplicated: Secondary | ICD-10-CM

## 2020-02-21 DIAGNOSIS — R0602 Shortness of breath: Secondary | ICD-10-CM

## 2020-02-21 DIAGNOSIS — R059 Cough, unspecified: Secondary | ICD-10-CM

## 2020-02-21 DIAGNOSIS — J019 Acute sinusitis, unspecified: Secondary | ICD-10-CM | POA: Diagnosis not present

## 2020-02-21 DIAGNOSIS — J3089 Other allergic rhinitis: Secondary | ICD-10-CM

## 2020-02-21 DIAGNOSIS — R070 Pain in throat: Secondary | ICD-10-CM

## 2020-02-21 MED ORDER — AMOXICILLIN-POT CLAVULANATE 875-125 MG PO TABS: 1.0000 | ORAL_TABLET | Freq: Two times a day (BID) | ORAL | 0 refills | Status: DC

## 2020-02-21 NOTE — Telephone Encounter (Signed)
Spoke with patient, advised of recommendations from [place].  She will not be back for 2 months, she will call the office to schedule her f/u.  Nothing further needed.

## 2020-02-21 NOTE — ED Triage Notes (Addendum)
Pt present sore throat, cough and SOB. Pt states that her sore throat started over a month ago. It hurts when she takes deep breath and trouble breathing at times. Pt was recently tested for covid 19 two weeks ago and result where negative.

## 2020-02-21 NOTE — ED Provider Notes (Signed)
[place]   MRN: [id number] DOB: [birth year]  Subjective:   Yolanda Yang is a 64 y.o. female presenting for 1 month history of persistent malaise, throat pain, cough, shortness of breath.  Patient feels like there is a thick block of mucus in her throat.  She has undergone Covid testing and was negative.  She saw her pulmonologist and was prescribed a steroid course which she is about to finish.  Still feels the same.  Has been using her albuterol inhaler.  Admits that she has had to travel to [place] for family in the past couple of weeks.  She has had her COVID vaccination.  No current facility-administered medications for this encounter.  Current Outpatient Medications:  .  albuterol (PROVENTIL HFA;VENTOLIN HFA) 108 (90 Base) MCG/ACT inhaler, INHALE 2 PUFFS INTO THE LUNGS EVERY 4 HOURS AS NEEDED FOR WHEEZING OR SHORTNESS OF BREATH., Disp: 54 g, Rfl: 3 .  albuterol (PROVENTIL) (2.5 MG/3ML) 0.083% nebulizer solution, INHALE THE CONTENTS OF 1 VIAL IN NEBULIZER EVERY 12 HOURS AS NEEDED, Disp: 75 mL, Rfl: 1 .  aspirin 81 MG chewable tablet, Chew 81 mg by mouth daily. , Disp: , Rfl:  .  butalbital-acetaminophen-caffeine (FIORICET, ESGIC) 50-325-40 MG per tablet, Take 1 tablet by mouth daily as needed for headache. , Disp: , Rfl:  .  chlorhexidine (PERIDEX) 0.12 % solution, chlorhexidine gluconate 0.12 % mouthwash  RINSE FOR 30 SECONDS WITH 15 MLS EVERY MORNING AND EVERY EVENING FOR 2 WEEKS., Disp: , Rfl:  .  clotrimazole (MYCELEX) 10 MG troche, DISSOLVE 1 TABLET BY MOUTH SLOWLY, 5 TIMES A DAY, Disp: , Rfl:  .  cyclobenzaprine (FLEXERIL) 10 MG tablet, Take 10 mg by mouth 3 (three) times daily as needed for muscle spasms. , Disp: , Rfl:  .  dexlansoprazole (DEXILANT) 60 MG capsule, Dexilant 60 mg capsule, delayed release, Disp: , Rfl:  .  diazepam (VALIUM) 5 MG tablet, Take 5 mg by mouth every 6 (six) hours as needed for anxiety., Disp: , Rfl:  .  dicyclomine (BENTYL) 20 MG tablet,  Take 1 tablet (20 mg total) by mouth 2 (two) times daily., Disp: 20 tablet, Rfl: 0 .  Diphenhyd-Hydrocort-Nystatin (FIRST-DUKES MOUTHWASH) SUSP, Use as directed 5 mLs in the mouth or throat 3 (three) times daily as needed., Disp: 240 mL, Rfl: 0 .  DULoxetine (CYMBALTA) 60 MG capsule, Take 60 mg by mouth every evening. , Disp: , Rfl:  .  estradiol (VIVELLE-DOT) 0.1 MG/24HR patch, Place 1 patch onto the skin 2 (two) times a week. , Disp: , Rfl:  .  fexofenadine (ALLEGRA) 180 MG tablet, Take 180 mg by mouth daily as needed for allergies or rhinitis., Disp: , Rfl:  .  fexofenadine-pseudoephedrine (ALLEGRA-D) 60-120 MG 12 hr tablet, Allergy Relief-D (fexofenadine) 60 mg-120 mg tablet,extended release  TAKE 1 TABLET BY MOUTH TWICE A DAY, Disp: , Rfl:  .  fluconazole (DIFLUCAN) 150 MG tablet, fluconazole 150 mg tablet, Disp: , Rfl:  .  gabapentin (NEURONTIN) 600 MG tablet, Take 600 mg by mouth 3 (three) times daily., Disp: , Rfl:  .  guaiFENesin (MUCINEX) 600 MG 12 hr tablet, Take by mouth., Disp: , Rfl:  .  HYDROmorphone (DILAUDID) 4 MG tablet, Take 1-1.5 tablets (4-6 mg total) by mouth every 4 (four) hours as needed for severe pain., Disp: 50 tablet, Rfl: 0 .  influenza vaccine (FLUCELVAX QUADRIVALENT) 0.5 ML injection, Flucelvax Quad 2020-2021 (PF) 60 mcg (15 mcg x 4)/0.5 mL IM syringe  PHARMACY ADMINISTERED,  Disp: , Rfl:  .  Linaclotide (LINZESS) 290 MCG CAPS capsule, Take 290 mcg by mouth every evening. , Disp: , Rfl:  .  Meth-Hyo-M Bl-Na Phos-Ph Sal (URIBEL) 118 MG CAPS, Take 2 capsules by mouth 2 (two) times daily as needed (interstitial cystitis)., Disp: , Rfl:  .  Meth-Hyo-M Bl-Na Phos-Ph Sal (URIBEL) 118 MG CAPS, Take by mouth., Disp: , Rfl:  .  metroNIDAZOLE (FLAGYL) 500 MG tablet, , Disp: , Rfl:  .  metroNIDAZOLE (METROGEL) 0.75 % vaginal gel, metronidazole 0.75 % vaginal gel, Disp: , Rfl:  .  montelukast (SINGULAIR) 10 MG tablet, Take 1 tablet (10 mg total) by mouth at bedtime., Disp: 30  tablet, Rfl: 6 .  morphine (KADIAN) 50 MG 24 hr capsule, , Disp: , Rfl:  .  nitrofurantoin, macrocrystal-monohydrate, (MACROBID) 100 MG capsule, Take 100 mg by mouth daily as needed (flare up). , Disp: , Rfl:  .  nystatin (MYCOSTATIN) 100000 UNIT/ML suspension, Take 5 mLs (500,000 Units total) by mouth 4 (four) times daily., Disp: 60 mL, Rfl: 0 .  nystatin cream (MYCOSTATIN), Apply 1 application topically at bedtime. PT USES PRN QHS WHEN ON ANTIBIOTICS TO PREVENT YEAST INFECTION, Disp: , Rfl:  .  nystatin-triamcinolone (MYCOLOG II) cream, nystatin-triamcinolone 100,000 unit/g-0.1 % topical cream  Apply 1 application twice a day by topical route as needed., Disp: , Rfl:  .  ondansetron (ZOFRAN) 4 MG tablet, Take 1 tablet (4 mg total) by mouth every 8 (eight) hours as needed for nausea or vomiting., Disp: 20 tablet, Rfl: 0 .  pentosan polysulfate (ELMIRON) 100 MG capsule, Take 100 mg by mouth 2 (two) times daily. , Disp: , Rfl:  .  potassium chloride (K-DUR) 10 MEQ tablet, Take 31m twice a day tomorrow, and then 233mtwice a day for two more days, Disp: 16 tablet, Rfl: 0 .  predniSONE (DELTASONE) 10 MG tablet, Take 4 tabs po daily x 3 days; then 3 tabs daily x3 days; then 2 tabs daily x3 days; then 1 tab daily x 3 days; then stop, Disp: 30 tablet, Rfl: 0 .  Probiotic Product (PROBIOTIC DAILY PO), Take 1 capsule by mouth every evening., Disp: , Rfl:  .  promethazine (PHENERGAN) 12.5 MG tablet, Take 12.5 mg by mouth every 6 (six) hours as needed for nausea., Disp: , Rfl:  .  rosuvastatin (CRESTOR) 10 MG tablet, Take 10 mg by mouth every evening. , Disp: , Rfl:  .  Zoster Vaccine Adjuvanted (SHINGRIX) injection, Shingrix (PF) 50 mcg/0.5 mL intramuscular suspension, kit  PHARMACY ADMINISTERED, Disp: , Rfl:    Allergies  Allergen Reactions  . Erythromycin Other (See Comments)    "Heart palpitation"  . Latex Rash    Past Medical History:  Diagnosis Date  . Anxiety   . Asthma exacerbation     PULMOLOGIST-- DR RAMASWANY  . Bladder pain   . Borderline type 2 diabetes mellitus    DIET CONTROLLED  . Cerebral aneurysm, [facility]ruptured [facility] currently has x4 stable aneurysm's (x2 left side & x2 right side)--  2012 s/p coiling of 4 aneurysm's  . Chronic constipation   . Cystitis, interstitial   . [place]  . GERD (gastroesophageal reflux disease)   . Headache syndrome    due to cerebral aneurysm's  . History of cerebral aneurysm repair    MULTIPLE  ANEURYSM--  S/P  COILING X4  ([facility])  . History of chronic sinusitis   .  History of duodenal ulcer    2007  . History of gastric ulcer   . History of GI bleed    UPPER--  2007  AND 05-2011  . History of recurrent UTIs   . Hyperlipemia   . IBS (irritable bowel syndrome)   . Incomplete emptying of bladder   . OA (osteoarthritis)   . Right foot drop    POST LUMBAR SURGERY 2011  . Seasonal allergic rhinitis   . Spinal headache 11/20/2018  . Urinary hesitancy   . Wears dentures    currently being fitted for full dentures  . Wears glasses      Past Surgical History:  Procedure Laterality Date  . ABDOMINAL HYSTERECTOMY  1997  . ANEURYSM COILING  05-08-2011     [facility]   INTRACRANIAL COILING X4  and Right Carotid stenting due to intraoperative injury  . ANTERIOR CERVICAL DECOMP/DISCECTOMY FUSION  2002   C4 -- C7  . BREAST REDUCTION SURGERY Bilateral 06/09/2014   Procedure: BILATERAL MAMMARY REDUCTION  (BREAST);  Surgeon: Irene Limbo, MD;  Location: [facility];  Service: Plastics;  Laterality: Bilateral;  . CYSTO/  HYDRODISTENTION/  INSTILLATION THERAPY  07-15-2006  . CYSTOSCOPY WITH HYDRODISTENSION AND BIOPSY N/A 09/08/2014   Procedure: CYSTOSCOPY/ HYDRODISTENSION OF BLADDER/ INSTILLATION OF MARCAINE AND PYRIDIUM;  Surgeon: Bjorn Loser, MD;  Location: [facility];  Service: Urology;  Laterality: N/A;  . ESOPHAGOGASTRODUODENOSCOPY   05/17/2011   Procedure: ESOPHAGOGASTRODUODENOSCOPY (EGD);  Surgeon: Beryle Beams;  Location[facility];  Service: Endoscopy;  Laterality: N/A;  . LEFT BREAST LUMPECTOMY  2000   benign  . NEGATIVE SLEEP STUDY  2011  PER PT  . POSTERIOR FUSION CERVICAL SPINE  2004   Revision C4 -- C7  . POSTERIOR LAMINECTOMY / DECOMPRESSION LUMBAR SPINE  05-17-2010   L3 -- L5  . ROTATOR CUFF REPAIR Right 2014  . TONSILLECTOMY  age 70  . TOTAL HIP ARTHROPLASTY Bilateral may & july 2013    Family History  Problem Relation Age of Onset  . Colon cancer Father   . Leukemia Father     Social History   Tobacco Use  . Smoking status: Former Smoker    Packs/day: 0.50    Years: 20.00    Pack years: 10.00    Types: Cigarettes    Quit date: 08/02/2011    Years since quitting: 8.5  . Smokeless tobacco: Never Used  Substance Use Topics  . Alcohol use: No  . Drug use: No    ROS   Objective:   Vitals: BP (!) 159/77 (BP Location: Left Arm)   Pulse 79   Temp 98.9 F (37.2 C) (Oral)   Resp 18   SpO2 98%   Physical Exam Constitutional:      General: She is not in acute distress.    Appearance: Normal appearance. She is well-developed. She is not ill-appearing, toxic-appearing or diaphoretic.  HENT:     Head: Normocephalic and atraumatic.     Right Ear: Tympanic membrane and ear canal normal. No drainage or tenderness. No middle ear effusion. Tympanic membrane is not erythematous.     Left Ear: Tympanic membrane and ear canal normal. No drainage or tenderness.  No middle ear effusion. Tympanic membrane is not erythematous.     Nose: Congestion present. No rhinorrhea.     Comments: Nasal mucosa erythematous.    Mouth/Throat:     Mouth: Mucous membranes are moist. No oral lesions.     Pharynx: No pharyngeal swelling,  oropharyngeal exudate, posterior oropharyngeal erythema or uvula swelling.     Tonsils: No tonsillar exudate or tonsillar abscesses.     Comments: Thick wall of postnasal drainage  overlying pharynx. Eyes:     Extraocular Movements: Extraocular movements intact.     Right eye: Normal extraocular motion.     Left eye: Normal extraocular motion.     Conjunctiva/sclera: Conjunctivae normal.     Pupils: Pupils are equal, round, and reactive to light.  Cardiovascular:     Rate and Rhythm: Normal rate and regular rhythm.     Pulses: Normal pulses.     Heart sounds: Normal heart sounds. No murmur heard.  No friction rub. No gallop.   Pulmonary:     Effort: Pulmonary effort is normal. No respiratory distress.     Breath sounds: Normal breath sounds. No stridor. No wheezing, rhonchi or rales.  Musculoskeletal:     Cervical back: Normal range of motion and neck supple.  Lymphadenopathy:     Cervical: No cervical adenopathy.  Skin:    General: Skin is warm and dry.     Findings: No rash.  Neurological:     General: No focal deficit present.     Mental Status: She is alert and oriented to person, place, and time.  Psychiatric:        Mood and Affect: Mood normal.        Behavior: Behavior normal.        Thought Content: Thought content normal.      Assessment and Plan :   PDMP not reviewed this encounter.  1. Acute sinusitis, recurrence not specified, unspecified location   2. Cough   3. Throat pain   4. Shortness of breath   5. Moderate persistent asthma without complication   6. Allergic rhinitis due to other allergic trigger, unspecified seasonality     Will start empiric treatment for sinusitis with Augmentin, recommended maintaining regular medications, finish prednisone course.  Recommended supportive care otherwise including the use of oral antihistamine, decongestant. Counseled patient on potential for adverse effects with medications prescribed/recommended today, ER and return-to-clinic precautions discussed, patient verbalized understanding.    Jaynee Eagles, [place] 02/21/20 [phone]

## 2020-02-21 NOTE — Telephone Encounter (Signed)
Looks like she went to UC. That sounds the the right call. Follow-up in 1-2 weeks. If not vaccinated needs televisit.

## 2020-02-21 NOTE — Telephone Encounter (Signed)
Primary Pulmonologist: Yolanda Yang Last office visit and with whom: 02/15/20 Yolanda Yang What do we see them for (pulmonary problems): Moderately persistent asthma Last OV assessment/plan:  Assessment & Plan Note by Yolanda Ehrich, NP at 02/16/2020 9:19 AM Author: Martyn Ehrich, NP Author Type: Nurse Practitioner Filed: 02/16/2020 9:20 AM  Note Status: Written Cosign: Cosign Not Required Encounter Date: 02/15/2020  Problem: GERD  Editor: Yolanda Ehrich, NP (Nurse Practitioner)                 - Continue Dexilant 60mg  daily and follow GERD diet  - May want to return back to GI if not improved     Assessment & Plan Note by Yolanda Ehrich, NP at 02/16/2020 9:18 AM Author: Martyn Ehrich, NP Author Type: Nurse Practitioner Filed: 02/16/2020 9:19 AM  Note Status: Written Cosign: Cosign Not Required Encounter Date: 02/15/2020  Problem: Asthma with acute exacerbation  Editor: Yolanda Ehrich, NP (Nurse Practitioner)                 - Patient reports increased shortness of breath, chest tightness and cough. Associated GERD symptoms. - Plan add singulair and tx prednisone taper. If no imrovement recommend going up symbicort 160    Patient Instructions by Yolanda Ehrich, NP at 02/15/2020 3:30 PM Author: Martyn Ehrich, NP Author Type: Nurse Practitioner Filed: 02/15/2020 4:15 PM  Note Status: Addendum Cosign: Cosign Not Required Encounter Date: 02/15/2020  Editor: Yolanda Ehrich, NP (Nurse Practitioner)      Prior Versions: 1. Yolanda Ehrich, NP (Nurse Practitioner) at 02/15/2020 4:15 PM - Addendum   2. Yolanda Ehrich, NP (Nurse Practitioner) at 02/15/2020 4:09 PM - Addendum   3. Yolanda Ehrich, NP (Nurse Practitioner) at 02/15/2020 4:09 PM - Addendum   4. Yolanda Ehrich, NP (Nurse Practitioner) at 02/15/2020 4:08 PM - Addendum   5. Yolanda Ehrich, NP (Nurse Practitioner) at 02/15/2020 4:05 PM - Signed      Recommendations: Continue Symbicort 80  two puffs twice daily  Continue Allegra daily  Continue Dexilant 60mg  daily  Adding Singulair 10mg  at bedtime (sent to [facility] mail in) Follow GERD diet   [facility]: [facility]  Plan: Add singulair and tx prednisone If not imrovement recommend going up symbicort 160  Follow-up: 4 months with Dr. Chase Yang     Food Choices for Gastroesophageal Reflux Disease, Adult When you have gastroesophageal reflux disease (GERD), the foods you eat and your eating habits are very important. Choosing the right foods can help ease your discomfort. Think about working with a nutrition specialist (dietitian) to help you make good choices. What are tips for following this plan?   Meals  Choose healthy foods that are low in fat, such as fruits, vegetables, whole grains, low-fat dairy products, and lean meat, fish, and poultry.  Eat small meals often instead of 3 large meals a day. Eat your meals slowly, and in a place where you are relaxed. Avoid bending over or lying down until 2-3 hours after eating.  Avoid eating meals 2-3 hours before bed.  Avoid drinking a lot of liquid with meals.  Cook foods using methods other than frying. Bake, grill, or broil food instead.  Avoid or limit: ? Chocolate. ? Peppermint or spearmint. ? Alcohol. ? Pepper. ? Black and decaffeinated coffee. ? Black and decaffeinated tea. ? Bubbly (carbonated) soft drinks. ? Caffeinated energy drinks and soft drinks.  Limit high-fat foods such as: ?  Fatty meat or fried foods. ? Whole milk, cream, butter, or ice cream. ? Nuts and nut butters. ? Pastries, donuts, and sweets made with butter or shortening.  Avoid foods that cause symptoms. These foods may be different for everyone. Common foods that cause symptoms include: ? Tomatoes. ? Oranges, lemons, and limes. ? Peppers. ? Spicy food. ? Onions and garlic. ? Vinegar. Lifestyle  Maintain a healthy weight. Ask your doctor what weight is healthy  for you. If you need to lose weight, work with your doctor to do so safely.  Exercise for at least 30 minutes for 5 or more days each week, or as told by your doctor.  Wear loose-fitting clothes.  Do not smoke. If you need help quitting, ask your doctor.  Sleep with the head of your bed higher than your feet. Use a wedge under the mattress or blocks under the bed frame to raise the head of the bed. Summary  When you have gastroesophageal reflux disease (GERD), food and lifestyle choices are very important in easing your symptoms.  Eat small meals often instead of 3 large meals a day. Eat your meals slowly, and in a place where you are relaxed.  Limit high-fat foods such as fatty meat or fried foods.  Avoid bending over or lying down until 2-3 hours after eating.  Avoid peppermint and spearmint, caffeine, alcohol, and chocolate. This information is not intended to replace advice given to you by your health care provider. Make sure you discuss any questions you have with your health care provider. Document Revised: 10/08/2018 Document Reviewed: 07/23/2016 Elsevier Patient Education  [name].       Instructions    Return in about 4 months (around 06/16/2020). Recommendations: Continue Symbicort 80 two puffs twice daily  Continue Allegra daily  Continue Dexilant 60mg  daily  Adding Singulair 10mg  at bedtime (sent to [facility] in) Follow GERD diet   Rx: [facility]  Plan: Add singulair and tx prednisone If not imrovement recommend going up symbicort 160  Follow-up: 4 months with Dr. Chase Yang        Was appointment offered to patient (explain)?  No, she is out of town at the moment, she travels   Reason for call: s/s have not improved on the prednisone.  States her throat is not as sore, but is still coughing and everything is sore from her nasal passages to her chest.  She still feels sick.  Her cough is not productive.  She denies  any fever.  She has had her covid vaccines.  She had a negative covid test recently.  She is having to use her nebulizer twice daily and is not getting a lot of relief.  She is having some sob despite using her symbicort.  States the Symbicort gives her thrush despite rinsing and brushing after using.  (examples of things to ask: : When did symptoms start? Fever? Cough? Productive? Color to sputum? More sputum than usual? Wheezing? Have you needed increased oxygen? Are you taking your respiratory medications? What over the counter measures have you tried?)  Allergies  Allergen Reactions  . Erythromycin Other (See Comments)    "Heart palpitation"  . Latex Rash    Immunization History  Administered Date(s) Administered  . Influenza Inj Mdck Quad Pf 04/06/2019  . Influenza Split 03/26/2013, 07/01/2017  . Influenza Whole 04/29/2006  . Influenza,inj,Quad PF,6+ Mos 05/10/2011, 05/04/2012, 05/12/2014, 02/24/2017  . Influenza-Unspecified 03/26/2013, 05/12/2014  . Moderna SARS-COVID-2 Vaccination 08/28/2019,  09/25/2019  . Td 12/05/2004  . Zoster Recombinat (Shingrix) 04/21/2019

## 2020-06-16 ENCOUNTER — Ambulatory Visit: Payer: Medicare HMO | Admitting: Primary Care

## 2020-09-07 ENCOUNTER — Other Ambulatory Visit: Payer: Self-pay | Admitting: Primary Care

## 2020-12-09 ENCOUNTER — Other Ambulatory Visit: Payer: Self-pay | Admitting: Internal Medicine

## 2021-01-08 ENCOUNTER — Other Ambulatory Visit: Payer: Self-pay | Admitting: Internal Medicine

## 2021-01-30 ENCOUNTER — Ambulatory Visit: Payer: Medicare HMO | Admitting: Podiatry

## 2021-01-31 ENCOUNTER — Other Ambulatory Visit: Payer: Medicare HMO

## 2021-01-31 ENCOUNTER — Ambulatory Visit: Payer: Medicare HMO | Admitting: Podiatry

## 2021-02-02 ENCOUNTER — Other Ambulatory Visit: Payer: Medicare HMO

## 2021-02-02 ENCOUNTER — Ambulatory Visit: Payer: Medicare HMO | Admitting: Podiatry

## 2021-04-06 ENCOUNTER — Ambulatory Visit: Payer: Medicare HMO | Admitting: Podiatry

## 2021-04-06 ENCOUNTER — Other Ambulatory Visit: Payer: Medicare HMO

## 2021-05-31 DIAGNOSIS — J45901 Unspecified asthma with (acute) exacerbation: Secondary | ICD-10-CM | POA: Diagnosis not present

## 2021-05-31 DIAGNOSIS — Z87891 Personal history of nicotine dependence: Secondary | ICD-10-CM | POA: Diagnosis not present

## 2021-05-31 DIAGNOSIS — Z79899 Other long term (current) drug therapy: Secondary | ICD-10-CM | POA: Diagnosis not present

## 2021-05-31 DIAGNOSIS — H9203 Otalgia, bilateral: Secondary | ICD-10-CM | POA: Diagnosis not present

## 2021-05-31 DIAGNOSIS — I10 Essential (primary) hypertension: Secondary | ICD-10-CM | POA: Diagnosis not present

## 2021-05-31 DIAGNOSIS — J209 Acute bronchitis, unspecified: Secondary | ICD-10-CM | POA: Diagnosis not present

## 2021-05-31 DIAGNOSIS — Z28310 Unvaccinated for covid-19: Secondary | ICD-10-CM | POA: Diagnosis not present

## 2021-05-31 DIAGNOSIS — J208 Acute bronchitis due to other specified organisms: Secondary | ICD-10-CM | POA: Diagnosis not present

## 2021-06-02 DIAGNOSIS — I10 Essential (primary) hypertension: Secondary | ICD-10-CM | POA: Diagnosis not present

## 2021-06-02 DIAGNOSIS — J22 Unspecified acute lower respiratory infection: Secondary | ICD-10-CM | POA: Diagnosis not present

## 2021-06-02 DIAGNOSIS — J069 Acute upper respiratory infection, unspecified: Secondary | ICD-10-CM | POA: Diagnosis not present

## 2021-08-07 ENCOUNTER — Other Ambulatory Visit: Payer: Self-pay

## 2021-08-07 ENCOUNTER — Ambulatory Visit: Payer: Medicare HMO

## 2021-08-07 ENCOUNTER — Ambulatory Visit: Payer: Medicare HMO | Admitting: Podiatry

## 2021-08-07 DIAGNOSIS — M21371 Foot drop, right foot: Secondary | ICD-10-CM | POA: Diagnosis not present

## 2021-08-07 DIAGNOSIS — M25571 Pain in right ankle and joints of right foot: Secondary | ICD-10-CM

## 2021-08-07 DIAGNOSIS — G8929 Other chronic pain: Secondary | ICD-10-CM

## 2021-08-07 DIAGNOSIS — M19071 Primary osteoarthritis, right ankle and foot: Secondary | ICD-10-CM

## 2021-08-07 NOTE — Progress Notes (Signed)
SITUATION Patient Name:  Yolanda Yang MRN:   [id number] Reason for Visit: Evaluation for Articulated AFO  Patient Report: Chief Complaint:   Foot drop Yolanda Yang of Discomfort/Pain:  Ambulatory Location:    right lower extremity Onset & Duration:   Sudden, Started with accident, and Present longer than 3 months Course:    unchanged Aggravating or Alleviating Factors: Ambulation  OBJECTIVE DATA & MEASUREMENTS Prognosis:    Good Duration of use:   5 years  Diagnosis:   ICD-10-CM   1. Arthritis of right ankle  M19.071     2. Right foot drop  M21.371        GOALS, NECESSITIES, & JUSTIFICATIONS Recommended Device: [place] Articulated Color:    Black Closure:   Laces and Boot Hooks  Laterality HCPCS Code Description Justification  right [phone] Plastic articulated orthosis, custom molded from a model of the patient, custom fabricated, includes casting and cast preparation. Necessary to provide triplanar support to the foot/ankle complex  right L2330 Addition to lower extremity, lacer molded to patient model Necessary to ensure secure hold of orthosis to patient's limb  right L2820 Addition to lower extremity orthosis, soft interface for molded plastic below knee section Necessary to relieve pressure on bony prominences  right L2210 x2 Dorsiflexion assist Necessary for foot drop    I certify that Yolanda Yang qualifies for and will benefit from an ankle foot orthosis used during ambulation based on meeting all of the following criteria;   The patient is: - Ambulatory, and - Has weakness or deformity of the foot and ankle, and - Requires stabilization for medical reasons, and - Has the potential to benefit functionally  The patients medical record contains sufficient documentation of the patients medical condition to substantiate the necessity for the type and quantity of the items ordered.  The goals of this therapy: - Improve Mobility - Improve Lower Extremity  Stability - Decrease Pain - Facilitate Soft Tissue Healing - Facilitate Immobilization, healing and treatment of an injury  Necessity of Ankle Foot Orthotic molded to patient model: A custom (vs. prefabricated) ankle foot orthosis has been prescribed based on the following criteria which are specific to the condition of this patient; - The patient could not be fit with a prefabricated AFO - The condition necessitating the orthosis is expected to be permanent or of longstanding duration (more than 6 months) - There is need to control the ankle or foot in more than one plane - The patient has a documented neurological, circulatory, or orthopedic condition that requires custom fabrication over a model to prevent tissue injury - The patient has a healing fracture that lacks normal anatomical integrity or anthropometric proportions  I hereby certify that the ankle foot orthotic described above is a rigid or semi-rigid device which is used for the purpose of supporting a weak or deformed body member or restricting or eliminating motion in a diseased or injured part of the body. It is designed to provide support and counterforce on the limb or body part that is being braced. In my opinion, the custom molded ankle foot orthosis is both reasonable and necessary in reference to accepted standards of medical practice in the treatment  of the patient condition and rehabilitation.  ACTIONS PERFORMED Patient was evaluated and casted for Specialty AFO via [employer]. Procedure was explained to patient. Patient tolerated procedure. patient selected device color and closure method.   PLAN Patient to return in four to six weeks for fitting and delivery  of device. Plan of care was explained to and agreed upon by patient. All questions were answered and concerns addressed.

## 2021-08-07 NOTE — Progress Notes (Signed)
°  Subjective:  Patient ID: Yolanda Yang, female    DOB: [birth year],  MRN: [id number]  Chief Complaint  Patient presents with   Foot Problem    Patient reports she is needing an order for a new brace    66 y.o. female presents with the above complaint. History confirmed with patient.  Continued foot drop from a back surgery. Reports increased rate of falling. Is traveling back and forth to care for her elderly mother. Reports significant issues with left knee, thus putting more pressure on her right foot/ankle.  Objective:  Physical Exam: warm, good capillary refill, no trophic changes or ulcerative lesions, normal DP and PT pulses and normal sensory exam. Functional foot drop right lower extremity with lack of muscle strength at anterior and lateral compartments Assessment:   1. Arthritis of right ankle   2. Right foot drop   3. Chronic pain of right ankle    Plan:  Patient was evaluated and treated and all questions answered.  Dropfoot right   -Unchanged condition but her brace is worn out and does need replacement. She additionally states she is having falls. Will fabricate new brace for patient - is seeing our orthotist Aaron Edelman Little today. -Offered PT to help with balance and fall prevention; declines at this time but I advised we could send a referral to a place of her choosing in [place] if she lets [place] know if/where she would like to go.  No follow-ups on file.

## 2021-08-08 DIAGNOSIS — M170 Bilateral primary osteoarthritis of knee: Secondary | ICD-10-CM | POA: Diagnosis not present

## 2021-08-09 ENCOUNTER — Other Ambulatory Visit: Payer: Self-pay

## 2021-08-09 ENCOUNTER — Ambulatory Visit: Payer: Medicare HMO | Admitting: Primary Care

## 2021-08-09 ENCOUNTER — Encounter: Payer: Self-pay | Admitting: Primary Care

## 2021-08-09 VITALS — BP 122/70 | HR 77 | Temp 98.0°F | Ht 65.0 in | Wt 163.6 lb

## 2021-08-09 DIAGNOSIS — K219 Gastro-esophageal reflux disease without esophagitis: Secondary | ICD-10-CM | POA: Diagnosis not present

## 2021-08-09 DIAGNOSIS — R059 Cough, unspecified: Secondary | ICD-10-CM

## 2021-08-09 DIAGNOSIS — J4520 Mild intermittent asthma, uncomplicated: Secondary | ICD-10-CM

## 2021-08-09 LAB — NITRIC OXIDE: FeNO level (ppb): 45

## 2021-08-09 MED ORDER — BUDESONIDE-FORMOTEROL FUMARATE 80-4.5 MCG/ACT IN AERO: 2.0000 | INHALATION_SPRAY | Freq: Two times a day (BID) | RESPIRATORY_TRACT | 1 refills | Status: DC

## 2021-08-09 MED ORDER — BUDESONIDE-FORMOTEROL FUMARATE 80-4.5 MCG/ACT IN AERO: 2.0000 | INHALATION_SPRAY | Freq: Two times a day (BID) | RESPIRATORY_TRACT | 0 refills | Status: DC

## 2021-08-09 MED ORDER — ALBUTEROL SULFATE HFA 108 (90 BASE) MCG/ACT IN AERS: INHALATION_SPRAY | RESPIRATORY_TRACT | 3 refills | Status: AC

## 2021-08-09 NOTE — Assessment & Plan Note (Addendum)
-   Cough and associated asthma symptoms worse off low dose maintenance ICS/LABA. FENO 45. Lunges were clear on exam. Advised patient resume Symbicot 84mcg 2 puffs BID. No need for oral prednisone today. If symptoms do not improve advised she notify our office.

## 2021-08-09 NOTE — Patient Instructions (Addendum)
Recommendations: - Restart Symbicort 80 take two puffs morning and evening daily (rinse mouth after use) - Use albuterol rescue inhaler 2 puffs every 6 hours for breakthrough shortness of breath/wheezing  - Continue Dexilant, allegra and Singulair as you have been taking  - If cough is no better in 1 month please call me   Follow-up: - 3 months with Eustaquio Maize NP

## 2021-08-09 NOTE — Assessment & Plan Note (Addendum)
-   Continue Dexilant 60mg  daily and GERD diet

## 2021-08-09 NOTE — Progress Notes (Signed)
'@Patient'  ID: Yolanda Yang, female    DOB: 07/25/1955, 66 y.o.   MRN: [id number]  Chief Complaint  Patient presents with   Follow-up    Dry cough, tickle in her throat makes chest hurt    Referring provider: Iona Beard, MD  HPI: 66 year old female, former smoker quit 2013 (10 pack year hx). PMH significant for asthma, allergic rhinitis. Patient of [place], last seen on 02/15/20. Maintained on Symbicort 80, albuterol hfa/neb as needed.   Previous [facility] pulmonary encounters: 06/22/2019 Patient contacted for televisit/follow-up asthma, needs medication refill. She splits her time between [place] and [place] to care for her mother. Experiences intermittent shortness of breath, cough and wheezing when lying flat. States that environmental and weather changes affect her asthma symptoms. Only using Symbicort as needed. Prefers albuterol nebulizer over rescue inhaler. Reports covid- negative.   02/15/2020 Patient presents today for 6 month follow. During last visit she reported increased shortness of breath. Recommended patient use Symbicort 80 regularly as scheduled twice daily. She has been using Symbicort inhaler twice daily as prescribed since. Today she reports symptoms of intermittent shortness of breath, GERD, voice hoarseness, chest tightness, cough and sore throat. She is complaint with dexilant. She splits her times between here and [place].   08/09/2021 Patient presents today for overdue follow-up/asthma. Cough started back 1 year ago. She splits her time between here and [place]. She comes back to [place] every 3 months for about a week. She still has local home/mailing address here. She is not currently on Symbicort, using albuterol inhaler every day and nebulizer every other day. She is compliant with Dexilant, Allegra and Singulair. She had CXR while in [place] and reports that it was normal. Cough is worse when she is in the city.   Allergies  Allergen Reactions    Erythromycin Other (See Comments)    "Heart palpitation"   Latex Rash    Immunization History  Administered Date(s) Administered   Influenza Inj Mdck Quad Pf 04/06/2019   Influenza Split 03/26/2013, 07/01/2017   Influenza Whole 04/29/2006   Influenza,inj,Quad PF,6+ Mos 05/10/2011, 05/04/2012, 05/12/2014, 02/24/2017   Influenza-Unspecified 03/26/2013, 05/12/2014   Moderna Sars-Covid-2 Vaccination 08/28/2019, 09/25/2019, 01/03/2020   Td 12/05/2004   Unspecified SARS-COV-2 Vaccination 08/28/2019, 09/25/2019   Zoster Recombinat (Shingrix) 04/21/2019    Past Medical History:  Diagnosis Date   Anxiety    Asthma exacerbation    PULMOLOGIST-- DR Yang   Bladder pain    Borderline type 2 diabetes mellitus    DIET CONTROLLED   Cerebral aneurysm, nonruptured monitored at [facility] by dr [place]olanda [place]   currently has x4 stable aneurysm's (x2 left side & x2 right side)--  2012 s/p coiling of 4 aneurysm's   Chronic constipation    Cystitis, interstitial    Diabetes mellitus without complication (HCC)    GERD (gastroesophageal reflux disease)    Headache syndrome    due to cerebral aneurysm's   History of cerebral aneurysm repair    MULTIPLE  ANEURYSM--  S/P  COILING X4  ([place])   History of chronic sinusitis    History of duodenal ulcer    2007   History of gastric ulcer    History of GI bleed    UPPER--  2007  AND 05-2011   History of recurrent UTIs    Hyperlipemia    IBS (irritable bowel syndrome)    Incomplete emptying of bladder    OA (osteoarthritis)    Right foot  drop    POST LUMBAR SURGERY 2011   Seasonal allergic rhinitis    Spinal headache 11/20/2018   Urinary hesitancy    Wears dentures    currently being fitted for full dentures   Wears glasses     Tobacco History: Social History   Tobacco Use  Smoking Status Former   Packs/day: 0.50   Years: 20.00   Pack years: 10.00   Types: Cigarettes   Quit date: 08/02/2011   Years since quitting: 10.0  Smokeless  Tobacco Never   Counseling given: Not Answered   Outpatient Medications Prior to Visit  Medication Sig Dispense Refill   albuterol (PROVENTIL) (2.5 MG/3ML) 0.083% nebulizer solution INHALE THE CONTENTS OF 1 VIAL IN NEBULIZER EVERY 12 HOURS AS NEEDED 75 mL 1   amoxicillin-clavulanate (AUGMENTIN) 875-125 MG tablet Take 1 tablet by mouth every 12 (twelve) hours. 14 tablet 0   aspirin 81 MG chewable tablet Chew 81 mg by mouth daily.      butalbital-acetaminophen-caffeine (FIORICET, ESGIC) 50-325-40 MG per tablet Take 1 tablet by mouth daily as needed for headache.      chlorhexidine (PERIDEX) 0.12 % solution chlorhexidine gluconate 0.12 % mouthwash  RINSE FOR 30 SECONDS WITH 15 MLS EVERY MORNING AND EVERY EVENING FOR 2 WEEKS.     cyclobenzaprine (FLEXERIL) 10 MG tablet Take 10 mg by mouth 3 (three) times daily as needed for muscle spasms.      dexlansoprazole (DEXILANT) 60 MG capsule Dexilant 60 mg capsule, delayed release     diazepam (VALIUM) 5 MG tablet Take 5 mg by mouth every 6 (six) hours as needed for anxiety.     DULoxetine (CYMBALTA) 60 MG capsule Take 60 mg by mouth every evening.      fluconazole (DIFLUCAN) 150 MG tablet fluconazole 150 mg tablet     gabapentin (NEURONTIN) 600 MG tablet Take 600 mg by mouth 3 (three) times daily.     guaiFENesin (MUCINEX) 600 MG 12 hr tablet Take by mouth.     metroNIDAZOLE (FLAGYL) 500 MG tablet      metroNIDAZOLE (METROGEL) 0.75 % vaginal gel metronidazole 0.75 % vaginal gel     montelukast (SINGULAIR) 10 MG tablet TAKE 1 TABLET AT BEDTIME 90 tablet 0   morphine (KADIAN) 50 MG 24 hr capsule      nystatin-triamcinolone (MYCOLOG II) cream nystatin-triamcinolone 100,000 unit/g-0.1 % topical cream  Apply 1 application twice a day by topical route as needed.     ondansetron (ZOFRAN) 4 MG tablet Take 1 tablet (4 mg total) by mouth every 8 (eight) hours as needed for nausea or vomiting. 20 tablet 0   promethazine (PHENERGAN) 12.5 MG tablet Take 12.5 mg  by mouth every 6 (six) hours as needed for nausea.     rosuvastatin (CRESTOR) 10 MG tablet Take 10 mg by mouth every evening.      Zoster Vaccine Adjuvanted (SHINGRIX) injection Shingrix (PF) 50 mcg/0.5 mL intramuscular suspension, kit  PHARMACY ADMINISTERED     albuterol (PROVENTIL HFA;VENTOLIN HFA) 108 (90 Base) MCG/ACT inhaler INHALE 2 PUFFS INTO THE LUNGS EVERY 4 HOURS AS NEEDED FOR WHEEZING OR SHORTNESS OF BREATH. 54 g 3   clotrimazole (MYCELEX) 10 MG troche DISSOLVE 1 TABLET BY MOUTH SLOWLY, 5 TIMES A DAY (Patient not taking: Reported on 08/09/2021)     dicyclomine (BENTYL) 20 MG tablet Take 1 tablet (20 mg total) by mouth 2 (two) times daily. (Patient not taking: Reported on 08/09/2021) 20 tablet 0   Diphenhyd-Hydrocort-Nystatin (FIRST-DUKES MOUTHWASH) SUSP Use  as directed 5 mLs in the mouth or throat 3 (three) times daily as needed. (Patient not taking: Reported on 08/09/2021) 240 mL 0   estradiol (VIVELLE-DOT) 0.1 MG/24HR patch Place 1 patch onto the skin 2 (two) times a week.  (Patient not taking: Reported on 08/09/2021)     fexofenadine (ALLEGRA) 180 MG tablet Take 180 mg by mouth daily as needed for allergies or rhinitis. (Patient not taking: Reported on 08/09/2021)     HYDROmorphone (DILAUDID) 4 MG tablet Take 1-1.5 tablets (4-6 mg total) by mouth every 4 (four) hours as needed for severe pain. (Patient not taking: Reported on 08/09/2021) 50 tablet 0   influenza vaccine (FLUCELVAX QUADRIVALENT) 0.5 ML injection Flucelvax Quad 2020-2021 (PF) 60 mcg (15 mcg x 4)/0.5 mL IM syringe  PHARMACY ADMINISTERED (Patient not taking: Reported on 08/09/2021)     linaclotide (LINZESS) 290 MCG CAPS capsule Take 290 mcg by mouth every evening.  (Patient not taking: Reported on 08/09/2021)     Meth-Hyo-M Bl-Na Phos-Ph Sal (URIBEL) 118 MG CAPS Take 2 capsules by mouth 2 (two) times daily as needed (interstitial cystitis). (Patient not taking: Reported on 08/09/2021)     Meth-Hyo-M Bl-Na Phos-Ph Sal (URIBEL) 118 MG CAPS Take  by mouth. (Patient not taking: Reported on 08/09/2021)     nitrofurantoin, macrocrystal-monohydrate, (MACROBID) 100 MG capsule Take 100 mg by mouth daily as needed (flare up).  (Patient not taking: Reported on 08/09/2021)     nystatin (MYCOSTATIN) 100000 UNIT/ML suspension Take 5 mLs (500,000 Units total) by mouth 4 (four) times daily. (Patient not taking: Reported on 08/09/2021) 60 mL 0   nystatin cream (MYCOSTATIN) Apply 1 application topically at bedtime. PT USES PRN QHS WHEN ON ANTIBIOTICS TO PREVENT YEAST INFECTION (Patient not taking: Reported on 08/09/2021)     pentosan polysulfate (ELMIRON) 100 MG capsule Take 100 mg by mouth 2 (two) times daily.  (Patient not taking: Reported on 08/09/2021)     potassium chloride (K-DUR) 10 MEQ tablet Take 83m twice a day tomorrow, and then 233mtwice a day for two more days (Patient not taking: Reported on 08/09/2021) 16 tablet 0   predniSONE (DELTASONE) 10 MG tablet Take 4 tabs po daily x 3 days; then 3 tabs daily x3 days; then 2 tabs daily x3 days; then 1 tab daily x 3 days; then stop (Patient not taking: Reported on 08/09/2021) 30 tablet 0   Probiotic Product (PROBIOTIC DAILY PO) Take 1 capsule by mouth every evening. (Patient not taking: Reported on 08/09/2021)     fexofenadine-pseudoephedrine (ALLEGRA-D) 60-120 MG 12 hr tablet Allergy Relief-D (fexofenadine) 60 mg-120 mg tablet,extended release  TAKE 1 TABLET BY MOUTH TWICE A DAY (Patient not taking: Reported on 08/09/2021)     No facility-administered medications prior to visit.    Review of Systems  Review of Systems  Constitutional: Negative.   HENT: Negative.    Respiratory:  Positive for cough.     Physical Exam  BP 122/70 (BP Location: Left Arm, Cuff Size: Normal)    Pulse 77    Temp 98 F (36.7 C) (Oral)    Ht '5\' 5"'  (1.651 m)    Wt 163 lb 9.6 oz (74.2 kg)    SpO2 98%    BMI 27.22 kg/m  Physical Exam Constitutional:      Appearance: Normal appearance.  HENT:     Head: Normocephalic and atraumatic.      Mouth/Throat:     Mouth: Mucous membranes are moist.     Pharynx:  Oropharynx is clear.  Cardiovascular:     Rate and Rhythm: Normal rate and regular rhythm.  Pulmonary:     Effort: Pulmonary effort is normal.     Breath sounds: Normal breath sounds. No wheezing, rhonchi or rales.  Musculoskeletal:        General: Normal range of motion.     Cervical back: Normal range of motion and neck supple.  Skin:    General: Skin is warm and dry.  Neurological:     General: No focal deficit present.     Mental Status: She is alert and oriented to person, place, and time. Mental status is at baseline.  Psychiatric:        Mood and Affect: Mood normal.        Behavior: Behavior normal.        Thought Content: Thought content normal.        Judgment: Judgment normal.     Lab Results:  CBC    Component Value Date/Time   WBC 6.5 09/25/2017 1657   RBC 4.70 09/25/2017 1657   HGB 13.3 09/25/2017 1657   HCT 40.1 09/25/2017 1657   PLT 288.0 09/25/2017 1657   MCV 85.2 09/25/2017 1657   MCH 27.8 10/03/2016 1805   MCHC 33.3 09/25/2017 1657   RDW 14.4 09/25/2017 1657   LYMPHSABS 2.7 09/25/2017 1657   MONOABS 0.5 09/25/2017 1657   EOSABS 0.1 09/25/2017 1657   BASOSABS 0.0 09/25/2017 1657    BMET    Component Value Date/Time   NA 141 09/25/2017 1657   K 4.0 09/25/2017 1657   CL 102 09/25/2017 1657   CO2 31 09/25/2017 1657   GLUCOSE 81 09/25/2017 1657   BUN 5 (L) 09/25/2017 1657   CREATININE 0.60 09/25/2017 1657   CALCIUM 9.1 09/25/2017 1657   GFRNONAA >60 10/03/2016 1805   GFRAA >60 10/03/2016 1805    BNP No results found for: BNP  ProBNP    Component Value Date/Time   PROBNP 61.0 09/25/2017 1657    Imaging: No results found.   Assessment & Plan:   Asthma - Cough and associated asthma symptoms worse off low dose maintenance ICS/LABA. Advised patient resume Symbicot 61mg 2 puffs BID. If symptoms do not improve advised she notify our office.   ALLERGIC RHINITIS -  Continue Allegra and Singulair 166mat bedtime   GERD - Continue Dexilant 6052maily and GERD diet   EliMartyn EhrichP 08/09/2021

## 2021-08-09 NOTE — Assessment & Plan Note (Signed)
-   Continue Allegra and Singulair 10mg  at bedtime

## 2021-08-10 ENCOUNTER — Ambulatory Visit: Payer: Medicare HMO | Admitting: Primary Care

## 2021-09-14 ENCOUNTER — Other Ambulatory Visit: Payer: Self-pay | Admitting: Internal Medicine

## 2021-11-14 DIAGNOSIS — Z20828 Contact with and (suspected) exposure to other viral communicable diseases: Secondary | ICD-10-CM | POA: Diagnosis not present

## 2021-11-14 DIAGNOSIS — J0140 Acute pansinusitis, unspecified: Secondary | ICD-10-CM | POA: Diagnosis not present

## 2021-11-14 DIAGNOSIS — J00 Acute nasopharyngitis [common cold]: Secondary | ICD-10-CM | POA: Diagnosis not present

## 2021-11-22 DIAGNOSIS — E785 Hyperlipidemia, unspecified: Secondary | ICD-10-CM | POA: Diagnosis not present

## 2021-11-22 DIAGNOSIS — J069 Acute upper respiratory infection, unspecified: Secondary | ICD-10-CM | POA: Diagnosis not present

## 2021-11-22 DIAGNOSIS — I10 Essential (primary) hypertension: Secondary | ICD-10-CM | POA: Diagnosis not present

## 2021-11-22 DIAGNOSIS — F4323 Adjustment disorder with mixed anxiety and depressed mood: Secondary | ICD-10-CM | POA: Diagnosis not present

## 2021-11-22 DIAGNOSIS — R519 Headache, unspecified: Secondary | ICD-10-CM | POA: Diagnosis not present

## 2021-12-03 ENCOUNTER — Ambulatory Visit (INDEPENDENT_AMBULATORY_CARE_PROVIDER_SITE_OTHER): Payer: Medicare HMO

## 2021-12-03 DIAGNOSIS — N9489 Other specified conditions associated with female genital organs and menstrual cycle: Secondary | ICD-10-CM | POA: Diagnosis not present

## 2021-12-03 DIAGNOSIS — F32A Depression, unspecified: Secondary | ICD-10-CM | POA: Diagnosis not present

## 2021-12-03 DIAGNOSIS — N3941 Urge incontinence: Secondary | ICD-10-CM | POA: Diagnosis not present

## 2021-12-03 DIAGNOSIS — M19071 Primary osteoarthritis, right ankle and foot: Secondary | ICD-10-CM

## 2021-12-03 DIAGNOSIS — N3010 Interstitial cystitis (chronic) without hematuria: Secondary | ICD-10-CM | POA: Diagnosis not present

## 2021-12-03 DIAGNOSIS — G894 Chronic pain syndrome: Secondary | ICD-10-CM | POA: Diagnosis not present

## 2021-12-03 DIAGNOSIS — M21371 Foot drop, right foot: Secondary | ICD-10-CM | POA: Diagnosis not present

## 2021-12-03 NOTE — Progress Notes (Signed)
SITUATION Reason for Visit: Dispensation and Fitting of [place] Patient Report: Patient reports comfort in ambulation and understands all instructions.  OBJECTIVE DATA Patient History / Diagnosis:     ICD-10-CM   1. Right foot drop  M21.371     2. Arthritis of right ankle  M19.071       Provided Device:  Custom Molded Gauntlet: Style [place] Articulated  Goals of Orthosis: - Improve gait - Decrease energy expenditure during the gait cycle - Improve balance - Stabilize motion at ankle and subtalar joint - Compensate for muscle weakness - Facilitate motion - Provide triplanar ankle and foot stabilization for weight bearing activities  Device Justification: - Patient is ambulatory  - Device is medically necessary as part of the overall treatment due to the patient's condition and related symptoms - It is anticipated that the patient will benefit functionally with use of the device.  - The custom device is utilized in an attempt to avoid the need for surgery and because a prefabricated device is inappropriate.  Upon gait analysis, the device appeared to be fitting well and the patient states that the device is comfortable.  ACTIONS PERFORMED Patient was fit with Games developer. Patient tolerated fitting procedure. Fit of the device is good. Patient was able to apply properly and ambulate without distress. Device function is to restrict and limit motion and provide stabilization in the ankle joint.   Goals and function of this device were explained in detail to the patient. The patient was shown how to properly apply, wear, and care for the device. It was explained that the device will fit and function best in an adjustable-closure shoe with a firm heel counter and a wide base of support. When the device was dispensed, it was suitable for the patient's condition and not substandard. No guarantees were given. Precautions were reviewed.    Written instructions, warranty information, and a copy of DMEPOS Supplier Standards were provided. All questions answered and concerns addressed.  PLAN Patient is to follow up in one week or as necessary (PRN). Plan of care was discussed with and agreed upon by patient.

## 2021-12-04 DIAGNOSIS — H9313 Tinnitus, bilateral: Secondary | ICD-10-CM | POA: Diagnosis not present

## 2021-12-04 DIAGNOSIS — R5382 Chronic fatigue, unspecified: Secondary | ICD-10-CM | POA: Diagnosis not present

## 2021-12-04 DIAGNOSIS — M21371 Foot drop, right foot: Secondary | ICD-10-CM | POA: Diagnosis not present

## 2021-12-04 DIAGNOSIS — N3010 Interstitial cystitis (chronic) without hematuria: Secondary | ICD-10-CM | POA: Diagnosis not present

## 2021-12-04 DIAGNOSIS — F4323 Adjustment disorder with mixed anxiety and depressed mood: Secondary | ICD-10-CM | POA: Diagnosis not present

## 2021-12-04 DIAGNOSIS — J449 Chronic obstructive pulmonary disease, unspecified: Secondary | ICD-10-CM | POA: Diagnosis not present

## 2021-12-06 ENCOUNTER — Ambulatory Visit: Payer: Medicare HMO | Admitting: Internal Medicine

## 2021-12-30 DIAGNOSIS — Z87891 Personal history of nicotine dependence: Secondary | ICD-10-CM | POA: Diagnosis not present

## 2021-12-30 DIAGNOSIS — M79652 Pain in left thigh: Secondary | ICD-10-CM | POA: Diagnosis not present

## 2021-12-30 DIAGNOSIS — J45909 Unspecified asthma, uncomplicated: Secondary | ICD-10-CM | POA: Diagnosis not present

## 2021-12-30 DIAGNOSIS — M1990 Unspecified osteoarthritis, unspecified site: Secondary | ICD-10-CM | POA: Diagnosis not present

## 2021-12-30 DIAGNOSIS — Z28310 Unvaccinated for covid-19: Secondary | ICD-10-CM | POA: Diagnosis not present

## 2021-12-30 DIAGNOSIS — M25552 Pain in left hip: Secondary | ICD-10-CM | POA: Diagnosis not present

## 2021-12-30 DIAGNOSIS — M25562 Pain in left knee: Secondary | ICD-10-CM | POA: Diagnosis not present

## 2022-01-28 DIAGNOSIS — I10 Essential (primary) hypertension: Secondary | ICD-10-CM | POA: Diagnosis not present

## 2022-01-28 DIAGNOSIS — J449 Chronic obstructive pulmonary disease, unspecified: Secondary | ICD-10-CM | POA: Diagnosis not present

## 2022-01-28 DIAGNOSIS — E785 Hyperlipidemia, unspecified: Secondary | ICD-10-CM | POA: Diagnosis not present

## 2022-02-28 DIAGNOSIS — E785 Hyperlipidemia, unspecified: Secondary | ICD-10-CM | POA: Diagnosis not present

## 2022-02-28 DIAGNOSIS — I10 Essential (primary) hypertension: Secondary | ICD-10-CM | POA: Diagnosis not present

## 2022-02-28 DIAGNOSIS — J449 Chronic obstructive pulmonary disease, unspecified: Secondary | ICD-10-CM | POA: Diagnosis not present

## 2022-03-12 DIAGNOSIS — M4804 Spinal stenosis, thoracic region: Secondary | ICD-10-CM | POA: Diagnosis not present

## 2022-03-12 DIAGNOSIS — M48061 Spinal stenosis, lumbar region without neurogenic claudication: Secondary | ICD-10-CM | POA: Diagnosis not present

## 2022-03-12 DIAGNOSIS — M5442 Lumbago with sciatica, left side: Secondary | ICD-10-CM | POA: Diagnosis not present

## 2022-03-12 DIAGNOSIS — Z981 Arthrodesis status: Secondary | ICD-10-CM | POA: Diagnosis not present

## 2022-03-12 DIAGNOSIS — M4316 Spondylolisthesis, lumbar region: Secondary | ICD-10-CM | POA: Diagnosis not present

## 2022-03-12 DIAGNOSIS — M5432 Sciatica, left side: Secondary | ICD-10-CM | POA: Diagnosis not present

## 2022-03-12 DIAGNOSIS — G8929 Other chronic pain: Secondary | ICD-10-CM | POA: Diagnosis not present

## 2022-03-12 DIAGNOSIS — Z96643 Presence of artificial hip joint, bilateral: Secondary | ICD-10-CM | POA: Diagnosis not present

## 2022-03-12 DIAGNOSIS — R29898 Other symptoms and signs involving the musculoskeletal system: Secondary | ICD-10-CM | POA: Diagnosis not present

## 2022-03-12 DIAGNOSIS — M47816 Spondylosis without myelopathy or radiculopathy, lumbar region: Secondary | ICD-10-CM | POA: Diagnosis not present

## 2022-03-13 DIAGNOSIS — M170 Bilateral primary osteoarthritis of knee: Secondary | ICD-10-CM | POA: Diagnosis not present

## 2022-03-13 DIAGNOSIS — M1711 Unilateral primary osteoarthritis, right knee: Secondary | ICD-10-CM | POA: Diagnosis not present

## 2022-03-13 DIAGNOSIS — M1712 Unilateral primary osteoarthritis, left knee: Secondary | ICD-10-CM | POA: Diagnosis not present

## 2022-03-13 DIAGNOSIS — M25562 Pain in left knee: Secondary | ICD-10-CM | POA: Diagnosis not present

## 2022-03-13 DIAGNOSIS — M25561 Pain in right knee: Secondary | ICD-10-CM | POA: Diagnosis not present

## 2022-03-14 DIAGNOSIS — I10 Essential (primary) hypertension: Secondary | ICD-10-CM | POA: Diagnosis not present

## 2022-03-14 DIAGNOSIS — Z6826 Body mass index (BMI) 26.0-26.9, adult: Secondary | ICD-10-CM | POA: Diagnosis not present

## 2022-03-14 DIAGNOSIS — Z1231 Encounter for screening mammogram for malignant neoplasm of breast: Secondary | ICD-10-CM | POA: Diagnosis not present

## 2022-03-14 DIAGNOSIS — B3731 Acute candidiasis of vulva and vagina: Secondary | ICD-10-CM | POA: Diagnosis not present

## 2022-03-14 DIAGNOSIS — Z1211 Encounter for screening for malignant neoplasm of colon: Secondary | ICD-10-CM | POA: Diagnosis not present

## 2022-03-14 DIAGNOSIS — K5904 Chronic idiopathic constipation: Secondary | ICD-10-CM | POA: Diagnosis not present

## 2022-03-14 DIAGNOSIS — Z01419 Encounter for gynecological examination (general) (routine) without abnormal findings: Secondary | ICD-10-CM | POA: Diagnosis not present

## 2022-03-14 DIAGNOSIS — E782 Mixed hyperlipidemia: Secondary | ICD-10-CM | POA: Diagnosis not present

## 2022-03-24 DIAGNOSIS — Z20822 Contact with and (suspected) exposure to covid-19: Secondary | ICD-10-CM | POA: Diagnosis not present

## 2022-03-25 DIAGNOSIS — Z20822 Contact with and (suspected) exposure to covid-19: Secondary | ICD-10-CM | POA: Diagnosis not present

## 2022-03-26 DIAGNOSIS — R6889 Other general symptoms and signs: Secondary | ICD-10-CM | POA: Diagnosis not present

## 2022-03-26 DIAGNOSIS — Z20828 Contact with and (suspected) exposure to other viral communicable diseases: Secondary | ICD-10-CM | POA: Diagnosis not present

## 2022-03-26 DIAGNOSIS — J069 Acute upper respiratory infection, unspecified: Secondary | ICD-10-CM | POA: Diagnosis not present

## 2022-03-26 DIAGNOSIS — J029 Acute pharyngitis, unspecified: Secondary | ICD-10-CM | POA: Diagnosis not present

## 2022-03-26 DIAGNOSIS — Z1159 Encounter for screening for other viral diseases: Secondary | ICD-10-CM | POA: Diagnosis not present

## 2022-03-26 DIAGNOSIS — Z1152 Encounter for screening for COVID-19: Secondary | ICD-10-CM | POA: Diagnosis not present

## 2022-03-28 DIAGNOSIS — Z20822 Contact with and (suspected) exposure to covid-19: Secondary | ICD-10-CM | POA: Diagnosis not present

## 2022-03-29 DIAGNOSIS — Z20822 Contact with and (suspected) exposure to covid-19: Secondary | ICD-10-CM | POA: Diagnosis not present

## 2022-04-01 DIAGNOSIS — Z1211 Encounter for screening for malignant neoplasm of colon: Secondary | ICD-10-CM | POA: Diagnosis not present

## 2022-04-01 DIAGNOSIS — Z20822 Contact with and (suspected) exposure to covid-19: Secondary | ICD-10-CM | POA: Diagnosis not present

## 2022-04-01 DIAGNOSIS — Z1212 Encounter for screening for malignant neoplasm of rectum: Secondary | ICD-10-CM | POA: Diagnosis not present

## 2022-04-02 DIAGNOSIS — Z20822 Contact with and (suspected) exposure to covid-19: Secondary | ICD-10-CM | POA: Diagnosis not present

## 2022-04-10 LAB — COLOGUARD: COLOGUARD: POSITIVE — AB

## 2022-04-15 DIAGNOSIS — R29898 Other symptoms and signs involving the musculoskeletal system: Secondary | ICD-10-CM | POA: Diagnosis not present

## 2022-04-15 DIAGNOSIS — M48061 Spinal stenosis, lumbar region without neurogenic claudication: Secondary | ICD-10-CM | POA: Diagnosis not present

## 2022-04-15 DIAGNOSIS — M4316 Spondylolisthesis, lumbar region: Secondary | ICD-10-CM | POA: Diagnosis not present

## 2022-04-15 DIAGNOSIS — M545 Low back pain, unspecified: Secondary | ICD-10-CM | POA: Diagnosis not present

## 2022-04-16 ENCOUNTER — Ambulatory Visit: Payer: Medicare HMO | Admitting: Nurse Practitioner

## 2022-04-16 ENCOUNTER — Ambulatory Visit (INDEPENDENT_AMBULATORY_CARE_PROVIDER_SITE_OTHER): Payer: Medicare HMO

## 2022-04-16 ENCOUNTER — Telehealth: Payer: Self-pay | Admitting: Nurse Practitioner

## 2022-04-16 ENCOUNTER — Encounter: Payer: Self-pay | Admitting: Nurse Practitioner

## 2022-04-16 VITALS — BP 112/80 | HR 83 | Ht 65.0 in | Wt 161.4 lb

## 2022-04-16 DIAGNOSIS — Z0000 Encounter for general adult medical examination without abnormal findings: Secondary | ICD-10-CM | POA: Diagnosis not present

## 2022-04-16 DIAGNOSIS — F4323 Adjustment disorder with mixed anxiety and depressed mood: Secondary | ICD-10-CM | POA: Diagnosis not present

## 2022-04-16 DIAGNOSIS — B9689 Other specified bacterial agents as the cause of diseases classified elsewhere: Secondary | ICD-10-CM

## 2022-04-16 DIAGNOSIS — J019 Acute sinusitis, unspecified: Secondary | ICD-10-CM | POA: Diagnosis not present

## 2022-04-16 DIAGNOSIS — J4541 Moderate persistent asthma with (acute) exacerbation: Secondary | ICD-10-CM

## 2022-04-16 DIAGNOSIS — Z23 Encounter for immunization: Secondary | ICD-10-CM | POA: Diagnosis not present

## 2022-04-16 DIAGNOSIS — Z8709 Personal history of other diseases of the respiratory system: Secondary | ICD-10-CM | POA: Diagnosis not present

## 2022-04-16 DIAGNOSIS — J449 Chronic obstructive pulmonary disease, unspecified: Secondary | ICD-10-CM | POA: Diagnosis not present

## 2022-04-16 DIAGNOSIS — E785 Hyperlipidemia, unspecified: Secondary | ICD-10-CM | POA: Diagnosis not present

## 2022-04-16 DIAGNOSIS — M21371 Foot drop, right foot: Secondary | ICD-10-CM | POA: Diagnosis not present

## 2022-04-16 DIAGNOSIS — N3010 Interstitial cystitis (chronic) without hematuria: Secondary | ICD-10-CM | POA: Diagnosis not present

## 2022-04-16 LAB — POCT EXHALED NITRIC OXIDE: FeNO level (ppb): 30

## 2022-04-16 MED ORDER — PREDNISONE 10 MG PO TABS: ORAL_TABLET | ORAL | 0 refills | Status: DC

## 2022-04-16 MED ORDER — FLUTICASONE PROPIONATE 50 MCG/ACT NA SUSP: 1.0000 | Freq: Every day | NASAL | 2 refills | Status: AC

## 2022-04-16 MED ORDER — BUDESONIDE-FORMOTEROL FUMARATE 160-4.5 MCG/ACT IN AERO: 2.0000 | INHALATION_SPRAY | Freq: Two times a day (BID) | RESPIRATORY_TRACT | 5 refills | Status: AC

## 2022-04-16 MED ORDER — METHYLPREDNISOLONE ACETATE 80 MG/ML IJ SUSP
80.0000 mg | Freq: Once | INTRAMUSCULAR | Status: AC
  Administered 2022-04-16: 80 mg via INTRAMUSCULAR

## 2022-04-16 MED ORDER — AMOXICILLIN-POT CLAVULANATE 875-125 MG PO TABS: 1.0000 | ORAL_TABLET | Freq: Two times a day (BID) | ORAL | 0 refills | Status: DC

## 2022-04-16 NOTE — Patient Instructions (Addendum)
Continue Albuterol inhaler 2 puffs or 3 mL neb every 6 hours as needed for shortness of breath or wheezing. Notify if symptoms persist despite rescue inhaler/neb use.  Switch Symbicort to higher dose steroid. 2 puffs Twice daily. Brush tongue and rinse mouth afterwards Continue singulair 1 tab At bedtime  Continue allegra 1 tab daily for allergies  Prednisone taper. 4 tabs for 2 days, then 3 tabs for 2 days, 2 tabs for 2 days, then 1 tab for 2 days, then stop. Take in AM with food. Start tomorrow Augmentin 1 tab Twice daily for 7 days. Take with food. Eat yogurt or take a daily probiotic while on the antibiotic. Mucinex [phone] mg Twice daily for congestion/cough Flonase nasal spray 2 sprays each nostril daily Saline nasal spray 2-3 times a day  Chest x ray today.  Follow up in 2 weeks with Dr. Chase Caller or Alanson Aly via virtual visit. If symptoms do not improve or worsen, please contact office for sooner follow up or seek emergency care.

## 2022-04-16 NOTE — Assessment & Plan Note (Signed)
Unresolved asthma exacerbation with elevated exhaled nitric oxide. CXR today to rule out superimposed infection.  Depo 80 mg injection x1.  We will treat her with prednisone taper.  Advised that we increase her Symbicort to higher dose ICS.  Asthma action plan in place.  Close follow-up.  Nontoxic in appearance and vital signs are stable on room air.  Patient Instructions  Continue Albuterol inhaler 2 puffs or 3 mL neb every 6 hours as needed for shortness of breath or wheezing. Notify if symptoms persist despite rescue inhaler/neb use.  Switch Symbicort to higher dose steroid. 2 puffs Twice daily. Brush tongue and rinse mouth afterwards Continue singulair 1 tab At bedtime  Continue allegra 1 tab daily for allergies  Prednisone taper. 4 tabs for 2 days, then 3 tabs for 2 days, 2 tabs for 2 days, then 1 tab for 2 days, then stop. Take in AM with food. Start tomorrow Augmentin 1 tab Twice daily for 7 days. Take with food. Eat yogurt or take a daily probiotic while on the antibiotic. Mucinex [phone] mg Twice daily for congestion/cough Flonase nasal spray 2 sprays each nostril daily Saline nasal spray 2-3 times a day  Chest x ray today.  Follow up in 2 weeks with Dr. Chase Caller or Alanson Aly via virtual visit. If symptoms do not improve or worsen, please contact office for sooner follow up or seek emergency care.

## 2022-04-16 NOTE — Progress Notes (Signed)
'@Patient'  ID: Yolanda Yang, female    DOB: 1956/01/21, 66 y.o.   MRN: [id number]  Chief Complaint  Patient presents with   Follow-up    Referring provider: Iona Beard, MD  HPI: 66 year old female, former smoker followed for asthma and chronic cough. She is a patient of Dr. Golden Pop and last seen in office 08/09/2021 by Volanda Napoleon NP. Past medical history significant for allergic rhinitis, GERD, athritis, HLD, endometriosis s/p hysterectomy, anxiety, depression, chronic back pain, insomnia.   TEST/EVENTS:  04/26/2013 CT chest without contrast: No evidence of ILD.  No air trapping.  Lungs are clear.   12/16/2017 PFT: FVC 99, FEV1 79, ratio 72, TLC 109, DLCOunc 91.  Mild to moderate obstruction with reversibility (24% improvement after BD)  08/09/2021: OV with Volanda Napoleon NP for overdue follow-up/asthma.  Cough started back 1 year ago.  Splits her time between here in [place].  Comes back to [place] every 3 months for about a week.  Not currently on Symbicort.  Compliant with Dexilant, Allegra and Singulair.  Using albuterol inhaler every day and nebulizer every other day.  Reports that she had a chest x-ray in [place] and this was normal.  Feels like her cough gets worse when she is in the city.  FeNO 45. Advised her to restart her ICS/LABA with Symbicort 80.   04/16/2022: Today - acute Patient presents today for acute visit.  Around a month ago, she developed a persistent cough and some increased shortness of breath.  She also had sinus congestion and felt like she might be getting sick.  She was seen in urgent care in [place] and treated with Z-Pak and prednisone.  She initially felt somewhat better.  Unfortunately, over the last few weeks she started to feel worse again.  Cough is persistent, primarily dry.  Paroxysmal at times.  She also has chest tightness, wheezing and increased shortness of breath from baseline.  She has tenderness on her sinuses and is producing yellow mucus every time she  blows her nose.  She has some headaches.  Denies any fevers, chills, hemoptysis, known sick exposures, orthopnea, lower extremity swelling, sore throat.  She is currently using Symbicort 82 puffs twice a day.  Having to use her albuterol more frequently.  She takes Singulair and Allegra for allergies.  She is still splitting her time between here in [place].  She helps take care of her mother whenever she is up there so she spends more of her time there.  FeNO 30 ppb  Allergies  Allergen Reactions   Erythromycin Other (See Comments)    "Heart palpitation"   Latex Rash    Immunization History  Administered Date(s) Administered   Influenza Inj Mdck Quad Pf 04/06/2019   Influenza Split 03/26/2013, 07/01/2017   Influenza Whole 04/29/2006   Influenza,inj,Quad PF,6+ Mos 05/10/2011, 05/04/2012, 05/12/2014, 02/24/2017   Influenza-Unspecified 03/26/2013, 05/12/2014   Moderna Sars-Covid-2 Vaccination 08/28/2019, 09/25/2019, 01/03/2020   Td 12/05/2004   Unspecified SARS-COV-2 Vaccination 08/28/2019, 09/25/2019   Zoster Recombinat (Shingrix) 04/21/2019    Past Medical History:  Diagnosis Date   Anxiety    Asthma exacerbation    PULMOLOGIST-- DR RAMASWANY   Bladder pain    Borderline type 2 diabetes mellitus    DIET CONTROLLED   Cerebral aneurysm, nonruptured monitored at [facility] by dr Anne Shutter   currently has x4 stable aneurysm's (x2 left side & x2 right side)--  2012 s/p coiling of 4 aneurysm's   Chronic constipation  Cystitis, interstitial    Diabetes mellitus without complication (HCC)    GERD (gastroesophageal reflux disease)    Headache syndrome    due to cerebral aneurysm's   History of cerebral aneurysm repair    MULTIPLE  ANEURYSM--  S/P  COILING X4  ([facility])   History of chronic sinusitis    History of duodenal ulcer    2007   History of gastric ulcer    History of GI bleed    UPPER--  2007  AND 05-2011   History of recurrent UTIs    Hyperlipemia    IBS  (irritable bowel syndrome)    Incomplete emptying of bladder    OA (osteoarthritis)    Right foot drop    POST LUMBAR SURGERY 2011   Seasonal allergic rhinitis    Spinal headache 11/20/2018   Urinary hesitancy    Wears dentures    currently being fitted for full dentures   Wears glasses     Tobacco History: Social History   Tobacco Use  Smoking Status Former   Packs/day: 0.50   Years: 20.00   Total pack years: 10.00   Types: Cigarettes   Quit date: 08/02/2011   Years since quitting: 10.7  Smokeless Tobacco Never   Counseling given: Not Answered   Outpatient Medications Prior to Visit  Medication Sig Dispense Refill   albuterol (PROVENTIL) (2.5 MG/3ML) 0.083% nebulizer solution INHALE THE CONTENTS OF 1 VIAL IN NEBULIZER EVERY 12 HOURS AS NEEDED 75 mL 1   albuterol (VENTOLIN HFA) 108 (90 Base) MCG/ACT inhaler INHALE 2 PUFFS INTO THE LUNGS EVERY 4 HOURS AS NEEDED FOR WHEEZING OR SHORTNESS OF BREATH. 18 g 3   aspirin 81 MG chewable tablet Chew 81 mg by mouth daily.      chlorhexidine (PERIDEX) 0.12 % solution chlorhexidine gluconate 0.12 % mouthwash  RINSE FOR 30 SECONDS WITH 15 MLS EVERY MORNING AND EVERY EVENING FOR 2 WEEKS.     dexlansoprazole (DEXILANT) 60 MG capsule Dexilant 60 mg capsule, delayed release     diazepam (VALIUM) 5 MG tablet Take 5 mg by mouth every 6 (six) hours as needed for anxiety.     Diphenhyd-Hydrocort-Nystatin (FIRST-DUKES MOUTHWASH) SUSP Use as directed 5 mLs in the mouth or throat 3 (three) times daily as needed. 240 mL 0   DULoxetine (CYMBALTA) 60 MG capsule Take 60 mg by mouth every evening.      fexofenadine (ALLEGRA) 180 MG tablet Take 180 mg by mouth daily as needed for allergies or rhinitis.     gabapentin (NEURONTIN) 600 MG tablet Take 600 mg by mouth 3 (three) times daily.     linaclotide (LINZESS) 290 MCG CAPS capsule Take 290 mcg by mouth every evening.     montelukast (SINGULAIR) 10 MG tablet TAKE 1 TABLET AT BEDTIME (NEED TO SCHEDULE  APPOINTMENT WITH DR. Chase Caller) 90 tablet 3   ondansetron (ZOFRAN) 4 MG tablet Take 1 tablet (4 mg total) by mouth every 8 (eight) hours as needed for nausea or vomiting. 20 tablet 0   promethazine (PHENERGAN) 12.5 MG tablet Take 12.5 mg by mouth every 6 (six) hours as needed for nausea.     rosuvastatin (CRESTOR) 10 MG tablet Take 10 mg by mouth every evening.      Zoster Vaccine Adjuvanted [facility]) injection Shingrix (PF) 50 mcg/0.5 mL intramuscular suspension, kit  PHARMACY ADMINISTERED     amoxicillin-clavulanate (AUGMENTIN) 875-125 MG tablet Take 1 tablet by mouth every 12 (twelve) hours. 14 tablet 0   budesonide-formoterol (SYMBICORT)  80-4.5 MCG/ACT inhaler Inhale 2 puffs into the lungs in the morning and at bedtime. 3 each 0   budesonide-formoterol (SYMBICORT) 80-4.5 MCG/ACT inhaler Inhale 2 puffs into the lungs in the morning and at bedtime. 3 each 1   butalbital-acetaminophen-caffeine (FIORICET, ESGIC) 50-325-40 MG per tablet Take 1 tablet by mouth daily as needed for headache.  (Patient not taking: Reported on 04/16/2022)     clotrimazole (MYCELEX) 10 MG troche DISSOLVE 1 TABLET BY MOUTH SLOWLY, 5 TIMES A DAY (Patient not taking: Reported on 08/09/2021)     cyclobenzaprine (FLEXERIL) 10 MG tablet Take 10 mg by mouth 3 (three) times daily as needed for muscle spasms.  (Patient not taking: Reported on 04/16/2022)     dicyclomine (BENTYL) 20 MG tablet Take 1 tablet (20 mg total) by mouth 2 (two) times daily. (Patient not taking: Reported on 08/09/2021) 20 tablet 0   estradiol (VIVELLE-DOT) 0.1 MG/24HR patch Place 1 patch onto the skin 2 (two) times a week.  (Patient not taking: Reported on 08/09/2021)     fluconazole (DIFLUCAN) 150 MG tablet fluconazole 150 mg tablet (Patient not taking: Reported on 04/16/2022)     guaiFENesin (MUCINEX) 600 MG 12 hr tablet Take by mouth. (Patient not taking: Reported on 04/16/2022)     HYDROmorphone (DILAUDID) 4 MG tablet Take 1-1.5 tablets (4-6 mg total) by mouth  every 4 (four) hours as needed for severe pain. (Patient not taking: Reported on 08/09/2021) 50 tablet 0   Meth-Hyo-M Bl-Na Phos-Ph Sal (URIBEL) 118 MG CAPS Take 2 capsules by mouth 2 (two) times daily as needed (interstitial cystitis). (Patient not taking: Reported on 08/09/2021)     Meth-Hyo-M Bl-Na Phos-Ph Sal (URIBEL) 118 MG CAPS Take by mouth. (Patient not taking: Reported on 08/09/2021)     metroNIDAZOLE (METROGEL) 0.75 % vaginal gel metronidazole 0.75 % vaginal gel (Patient not taking: Reported on 04/16/2022)     morphine (KADIAN) 50 MG 24 hr capsule  (Patient not taking: Reported on 04/16/2022)     nitrofurantoin, macrocrystal-monohydrate, (MACROBID) 100 MG capsule Take 100 mg by mouth daily as needed (flare up).  (Patient not taking: Reported on 08/09/2021)     nystatin (MYCOSTATIN) 100000 UNIT/ML suspension Take 5 mLs (500,000 Units total) by mouth 4 (four) times daily. (Patient not taking: Reported on 08/09/2021) 60 mL 0   nystatin cream (MYCOSTATIN) Apply 1 application topically at bedtime. PT USES PRN QHS WHEN ON ANTIBIOTICS TO PREVENT YEAST INFECTION (Patient not taking: Reported on 08/09/2021)     nystatin-triamcinolone (MYCOLOG II) cream nystatin-triamcinolone 100,000 unit/g-0.1 % topical cream  Apply 1 application twice a day by topical route as needed. (Patient not taking: Reported on 04/16/2022)     pentosan polysulfate (ELMIRON) 100 MG capsule Take 100 mg by mouth 2 (two) times daily.  (Patient not taking: Reported on 08/09/2021)     potassium chloride (K-DUR) 10 MEQ tablet Take 59m twice a day tomorrow, and then 230mtwice a day for two more days (Patient not taking: Reported on 08/09/2021) 16 tablet 0   Probiotic Product (PROBIOTIC DAILY PO) Take 1 capsule by mouth every evening. (Patient not taking: Reported on 08/09/2021)     influenza vaccine (FLUCELVAX QUADRIVALENT) 0.5 ML injection Flucelvax Quad 2020-2021 (PF) 60 mcg (15 mcg x 4)/0.5 mL IM syringe  PHARMACY ADMINISTERED (Patient not taking:  Reported on 08/09/2021)     metroNIDAZOLE (FLAGYL) 500 MG tablet  (Patient not taking: Reported on 04/16/2022)     predniSONE (DELTASONE) 10 MG tablet Take 4 tabs  po daily x 3 days; then 3 tabs daily x3 days; then 2 tabs daily x3 days; then 1 tab daily x 3 days; then stop (Patient not taking: Reported on 08/09/2021) 30 tablet 0   No facility-administered medications prior to visit.     Review of Systems:   Constitutional: No weight loss or gain, night sweats, fevers, chills, fatigue, or lassitude. HEENT: No difficulty swallowing, tooth/dental problems, or sore throat. No sneezing, itching, ear ache. +sinus tenderness, nasal congestion, post nasal drip, headaches CV:  No chest pain, orthopnea, PND, swelling in lower extremities, anasarca, dizziness, palpitations, syncope Resp: +shortness of breath with exertion; wheezing; coughing. No excess mucus or change in color of mucus. No hemoptysis. No chest wall deformity GI:  No heartburn, indigestion, abdominal pain, nausea, vomiting, diarrhea, change in bowel habits, loss of appetite, bloody stools.  Skin: No rash, lesions, ulcerations Neuro: No dizziness or lightheadedness.  Psych: No depression or anxiety. Mood stable.     Physical Exam:  BP 112/80 (BP Location: Right Arm, Cuff Size: Normal)   Pulse 83   Ht '5\' 5"'  (1.651 m)   Wt 161 lb 6.4 oz (73.2 kg)   SpO2 98%   BMI 26.86 kg/m   GEN: Pleasant, interactive, well-appearing; in no acute distress. HEENT:  Normocephalic and atraumatic. PERRLA. Sclera white. Nasal turbinates erythematous, moist and patent bilaterally. White rhinorrhea present. Oropharynx erythematous and moist, without exudate or edema. Maxillary sinus tenderness. No lesions, ulcerations NECK:  Supple w/ fair ROM. No JVD present. Normal carotid impulses w/o bruits. Thyroid symmetrical with no goiter or nodules palpated. No lymphadenopathy.   CV: RRR, no m/r/g, no peripheral edema. Pulses intact, +2 bilaterally. No cyanosis,  pallor or clubbing. PULMONARY:  Unlabored, regular breathing. Mild end expiratory wheezes bilaterally A&P. No accessory muscle use.  GI: BS present and normoactive. Soft, non-tender to palpation. No organomegaly or masses detected.  MSK: No erythema, warmth or tenderness. No deformities or joint swelling noted.  Neuro: A/Ox3. No focal deficits noted.   Skin: Warm, no lesions or rashe Psych: Normal affect and behavior. Judgement and thought content appropriate.     Lab Results:  CBC    Component Value Date/Time   WBC 6.5 09/25/2017 1657   RBC 4.70 09/25/2017 1657   HGB 13.3 09/25/2017 1657   HCT 40.1 09/25/2017 1657   PLT 288.0 09/25/2017 1657   MCV 85.2 09/25/2017 1657   MCH 27.8 10/03/2016 1805   MCHC 33.3 09/25/2017 1657   RDW 14.4 09/25/2017 1657   LYMPHSABS 2.7 09/25/2017 1657   MONOABS 0.5 09/25/2017 1657   EOSABS 0.1 09/25/2017 1657   BASOSABS 0.0 09/25/2017 1657    BMET    Component Value Date/Time   NA 141 09/25/2017 1657   K 4.0 09/25/2017 1657   CL 102 09/25/2017 1657   CO2 31 09/25/2017 1657   GLUCOSE 81 09/25/2017 1657   BUN 5 (L) 09/25/2017 1657   CREATININE 0.60 09/25/2017 1657   CALCIUM 9.1 09/25/2017 1657   GFRNONAA >60 10/03/2016 1805   GFRAA >60 10/03/2016 1805    BNP No results found for: "BNP"   Imaging:  No results found.  methylPREDNISolone acetate (DEPO-MEDROL) injection 80 mg     Date Action Dose Route User   04/16/2022 1410 Given 80 mg Intramuscular (Right Ventrogluteal) Ronney Lion, Lorenso Courier, LPN          Latest Ref Rng & Units 12/16/2017   12:22 PM  PFT Results  FVC-Pre L 2.78   FVC-Predicted  Pre % 99   FVC-Post L 3.03   FVC-Predicted Post % 109   Pre FEV1/FVC % % 63   Post FEV1/FCV % % 72   FEV1-Pre L 1.75   FEV1-Predicted Pre % 79   FEV1-Post L 2.18   DLCO uncorrected ml/min/mmHg 23.36   DLCO UNC% % 91   DLVA Predicted % 95   TLC L 5.69   TLC % Predicted % 109   RV % Predicted % 134     No results found  for: "NITRICOXIDE"      Assessment & Plan:   Asthma with acute exacerbation Unresolved asthma exacerbation with elevated exhaled nitric oxide. CXR today to rule out superimposed infection.  Depo 80 mg injection x1.  We will treat her with prednisone taper.  Advised that we increase her Symbicort to higher dose ICS.  Asthma action plan in place.  Close follow-up.  Nontoxic in appearance and vital signs are stable on room air.  Patient Instructions  Continue Albuterol inhaler 2 puffs or 3 mL neb every 6 hours as needed for shortness of breath or wheezing. Notify if symptoms persist despite rescue inhaler/neb use.  Switch Symbicort to higher dose steroid. 2 puffs Twice daily. Brush tongue and rinse mouth afterwards Continue singulair 1 tab At bedtime  Continue allegra 1 tab daily for allergies  Prednisone taper. 4 tabs for 2 days, then 3 tabs for 2 days, 2 tabs for 2 days, then 1 tab for 2 days, then stop. Take in AM with food. Start tomorrow Augmentin 1 tab Twice daily for 7 days. Take with food. Eat yogurt or take a daily probiotic while on the antibiotic. Mucinex [phone] mg Twice daily for congestion/cough Flonase nasal spray 2 sprays each nostril daily Saline nasal spray 2-3 times a day  Chest x ray today.  Follow up in 2 weeks with Dr. Chase Caller or Alanson Aly via virtual visit. If symptoms do not improve or worsen, please contact office for sooner follow up or seek emergency care.     Acute bacterial rhinosinusitis ABRS; progressively worsened.  We will treat her with empiric Augmentin 7-day course.  Prednisone for inflammation.  Supportive care measures advised.     I spent 35 minutes of dedicated to the care of this patient on the date of this encounter to include pre-visit review of records, face-to-face time with the patient discussing conditions above, post visit ordering of testing, clinical documentation with the electronic health record, making appropriate referrals as  documented, and communicating necessary findings to members of the patients care team.  Clayton Bibles, NP 04/16/2022  Pt aware and understands NP's role.

## 2022-04-16 NOTE — Assessment & Plan Note (Addendum)
ABRS; progressively worsened.  We will treat her with empiric Augmentin 7-day course.  Prednisone for inflammation.  Supportive care measures advised.

## 2022-04-17 DIAGNOSIS — M170 Bilateral primary osteoarthritis of knee: Secondary | ICD-10-CM | POA: Diagnosis not present

## 2022-04-17 NOTE — Telephone Encounter (Signed)
I called the patient and let her know that per the note she had receive a depo medrol injection in the office per Katie Cobb note. She voices understanding. Nothing further needed.

## 2022-04-18 DIAGNOSIS — Z20822 Contact with and (suspected) exposure to covid-19: Secondary | ICD-10-CM | POA: Diagnosis not present

## 2022-04-19 DIAGNOSIS — Z20822 Contact with and (suspected) exposure to covid-19: Secondary | ICD-10-CM | POA: Diagnosis not present

## 2022-04-21 DIAGNOSIS — R52 Pain, unspecified: Secondary | ICD-10-CM | POA: Diagnosis not present

## 2022-04-21 DIAGNOSIS — J029 Acute pharyngitis, unspecified: Secondary | ICD-10-CM | POA: Diagnosis not present

## 2022-04-21 DIAGNOSIS — R059 Cough, unspecified: Secondary | ICD-10-CM | POA: Diagnosis not present

## 2022-04-21 DIAGNOSIS — Z20822 Contact with and (suspected) exposure to covid-19: Secondary | ICD-10-CM | POA: Diagnosis not present

## 2022-04-22 DIAGNOSIS — R059 Cough, unspecified: Secondary | ICD-10-CM | POA: Diagnosis not present

## 2022-04-22 DIAGNOSIS — J029 Acute pharyngitis, unspecified: Secondary | ICD-10-CM | POA: Diagnosis not present

## 2022-04-22 DIAGNOSIS — Z20822 Contact with and (suspected) exposure to covid-19: Secondary | ICD-10-CM | POA: Diagnosis not present

## 2022-04-22 DIAGNOSIS — R52 Pain, unspecified: Secondary | ICD-10-CM | POA: Diagnosis not present

## 2022-05-01 ENCOUNTER — Telehealth (INDEPENDENT_AMBULATORY_CARE_PROVIDER_SITE_OTHER): Payer: Medicare HMO | Admitting: Nurse Practitioner

## 2022-05-01 ENCOUNTER — Encounter: Payer: Self-pay | Admitting: Nurse Practitioner

## 2022-05-01 VITALS — Ht 65.0 in | Wt 161.4 lb

## 2022-05-01 DIAGNOSIS — J4530 Mild persistent asthma, uncomplicated: Secondary | ICD-10-CM

## 2022-05-01 DIAGNOSIS — B3731 Acute candidiasis of vulva and vagina: Secondary | ICD-10-CM | POA: Insufficient documentation

## 2022-05-01 MED ORDER — FLUCONAZOLE 150 MG PO TABS: 150.0000 mg | ORAL_TABLET | Freq: Once | ORAL | 0 refills | Status: AC

## 2022-05-01 NOTE — Progress Notes (Signed)
Patient ID: ADELITA HONE, female     DOB: 05/03/1956, 66 y.o.      MRN: [id number]  Chief Complaint  Patient presents with   Follow-up    Pt f/u for asthma, she reports she is better other than chest tightness and a cough. She was unable to afford high dose Symbicort.    Virtual Visit via Video Note  I connected with [name] on 05/01/22 at  2:30 PM EDT by a video enabled telemedicine application and verified that I am speaking with the correct person using two identifiers.  Location: Patient: Home Provider: Office   I discussed the limitations of evaluation and management by telemedicine and the availability of in person appointments. The patient expressed understanding and agreed to proceed.  History of Present Illness: 66 year old female, former smoker followed for asthma and chronic cough.  She is patient Dr. Golden Pop and last seen in office 04/16/2022 by [facility] NP.  Past medical history significant for allergic rhinitis, GERD, arthritis, HLD, endometriosis status post hysterectomy, anxiety, depression, chronic back pain, insomnia.  04/16/2022: OV with Jacalynn Buzzell NP for acute visit.  Developed persistent cough and increased shortness of breath around a month ago.  Also had sinus congestion.  She was seen in urgent care in [place] and treated with Z-Pak and prednisone.  Initially felt somewhat better but unfortunately developed worsening symptoms.  Cough was paroxysmal.  Also having persistent sinus symptoms with associated tenderness and headaches.  Treated for acute asthma exacerbation with elevated exhaled nitric oxide with Depo 80 mg injection and prednisone taper.  Also advised that we increase Symbicort to higher dose ICS.  Treated with Augmentin for acute bacterial rhinosinusitis.  05/01/2022: Today-follow-up Patient presents today via virtual visit for follow up. She feels much better compared to when I saw her last. Feels like th augmentin and steroids really helped clear  things up. She still has some occasional chest tightness and wheezing but this clears up with her inhaler. She denies fevers, chills, productive cough. She never received high dose Symbicort; insurance wouldn't cover it. She's currently using her Symbicort 80 mcg twice a day. She is worried that the antibiotic may have caused a yeast infection. Has some vaginal itching and discharge. No odor or urinary symptoms.   Allergies  Allergen Reactions   Erythromycin Other (See Comments)    "Heart palpitation"   Latex Rash   Immunization History  Administered Date(s) Administered   Influenza Inj Mdck Quad Pf 04/06/2019   Influenza Split 03/26/2013, 07/01/2017   Influenza Whole 04/29/2006   Influenza,inj,Quad PF,6+ Mos 05/10/2011, 05/04/2012, 05/12/2014, 02/24/2017   Influenza-Unspecified 03/26/2013, 05/12/2014   Moderna Sars-Covid-2 Vaccination 08/28/2019, 09/25/2019, 01/03/2020   Td 12/05/2004   Td (Adult),5 Lf Tetanus Toxid, Preservative Free 12/05/2004   Unspecified SARS-COV-2 Vaccination 08/28/2019, 09/25/2019   Zoster Recombinat (Shingrix) 04/21/2019   Past Medical History:  Diagnosis Date   Anxiety    Asthma exacerbation    PULMOLOGIST-- DR RAMASWANY   Bladder pain    Borderline type 2 diabetes mellitus    DIET CONTROLLED   Cerebral aneurysm, nonruptured monitored at [facility] by dr Anne Shutter   currently has x4 stable aneurysm's (x2 left side & x2 right side)--  2012 s/p coiling of 4 aneurysm's   Chronic constipation    Cystitis, interstitial    Diabetes mellitus without complication (HCC)    GERD (gastroesophageal reflux disease)    Headache syndrome    due to cerebral aneurysm's   History of cerebral  aneurysm repair    MULTIPLE  ANEURYSM--  S/P  COILING X4  ([facility])   History of chronic sinusitis    History of duodenal ulcer    2007   History of gastric ulcer    History of GI bleed    UPPER--  2007  AND 05-2011   History of recurrent UTIs    Hyperlipemia    IBS  (irritable bowel syndrome)    Incomplete emptying of bladder    OA (osteoarthritis)    Right foot drop    POST LUMBAR SURGERY 2011   Seasonal allergic rhinitis    Spinal headache 11/20/2018   Urinary hesitancy    Wears dentures    currently being fitted for full dentures   Wears glasses     Tobacco History: Social History   Tobacco Use  Smoking Status Former   Packs/day: 0.50   Years: 20.00   Total pack years: 10.00   Types: Cigarettes   Quit date: 08/02/2011   Years since quitting: 10.7  Smokeless Tobacco Never   Counseling given: Not Answered   Outpatient Medications Prior to Visit  Medication Sig Dispense Refill   albuterol (PROVENTIL) (2.5 MG/3ML) 0.083% nebulizer solution INHALE THE CONTENTS OF 1 VIAL IN NEBULIZER EVERY 12 HOURS AS NEEDED 75 mL 1   albuterol (VENTOLIN HFA) 108 (90 Base) MCG/ACT inhaler INHALE 2 PUFFS INTO THE LUNGS EVERY 4 HOURS AS NEEDED FOR WHEEZING OR SHORTNESS OF BREATH. 18 g 3   amoxicillin-clavulanate (AUGMENTIN) 875-125 MG tablet Take 1 tablet by mouth 2 (two) times daily. 14 tablet 0   aspirin 81 MG chewable tablet Chew 81 mg by mouth daily.      budesonide-formoterol (SYMBICORT) 160-4.5 MCG/ACT inhaler Inhale 2 puffs into the lungs in the morning and at bedtime. 1 each 5   butalbital-acetaminophen-caffeine (FIORICET, ESGIC) 50-325-40 MG per tablet Take 1 tablet by mouth daily as needed for headache.     chlorhexidine (PERIDEX) 0.12 % solution chlorhexidine gluconate 0.12 % mouthwash  RINSE FOR 30 SECONDS WITH 15 MLS EVERY MORNING AND EVERY EVENING FOR 2 WEEKS.     clotrimazole (MYCELEX) 10 MG troche      cyclobenzaprine (FLEXERIL) 10 MG tablet Take 10 mg by mouth 3 (three) times daily as needed for muscle spasms.     dexlansoprazole (DEXILANT) 60 MG capsule Dexilant 60 mg capsule, delayed release     diazepam (VALIUM) 5 MG tablet Take 5 mg by mouth every 6 (six) hours as needed for anxiety.     dicyclomine (BENTYL) 20 MG tablet Take 1 tablet (20  mg total) by mouth 2 (two) times daily. 20 tablet 0   Diphenhyd-Hydrocort-Nystatin (FIRST-DUKES MOUTHWASH) SUSP Use as directed 5 mLs in the mouth or throat 3 (three) times daily as needed. 240 mL 0   DULoxetine (CYMBALTA) 60 MG capsule Take 60 mg by mouth every evening.      estradiol (VIVELLE-DOT) 0.1 MG/24HR patch Place 1 patch onto the skin 2 (two) times a week.     fexofenadine (ALLEGRA) 180 MG tablet Take 180 mg by mouth daily as needed for allergies or rhinitis.     fluticasone (FLONASE) 50 MCG/ACT nasal spray Place 1 spray into both nostrils daily. 18.2 mL 2   gabapentin (NEURONTIN) 600 MG tablet Take 600 mg by mouth 3 (three) times daily.     guaiFENesin (MUCINEX) 600 MG 12 hr tablet Take by mouth.     HYDROmorphone (DILAUDID) 4 MG tablet Take 1-1.5 tablets (4-6 mg total) by  mouth every 4 (four) hours as needed for severe pain. 50 tablet 0   linaclotide (LINZESS) 290 MCG CAPS capsule Take 290 mcg by mouth every evening.     Meth-Hyo-M Bl-Na Phos-Ph Sal (URIBEL) 118 MG CAPS Take 2 capsules by mouth 2 (two) times daily as needed (interstitial cystitis).     Meth-Hyo-M Bl-Na Phos-Ph Sal (URIBEL) 118 MG CAPS Take by mouth.     metroNIDAZOLE (METROGEL) 0.75 % vaginal gel      montelukast (SINGULAIR) 10 MG tablet TAKE 1 TABLET AT BEDTIME (NEED TO SCHEDULE APPOINTMENT WITH DR. Chase Caller) 90 tablet 3   morphine (KADIAN) 50 MG 24 hr capsule      nitrofurantoin, macrocrystal-monohydrate, (MACROBID) 100 MG capsule Take 100 mg by mouth daily as needed (flare up).     nystatin (MYCOSTATIN) 100000 UNIT/ML suspension Take 5 mLs (500,000 Units total) by mouth 4 (four) times daily. 60 mL 0   nystatin cream (MYCOSTATIN) Apply 1 application  topically at bedtime. PT USES PRN QHS WHEN ON ANTIBIOTICS TO PREVENT YEAST INFECTION     nystatin-triamcinolone (MYCOLOG II) cream      ondansetron (ZOFRAN) 4 MG tablet Take 1 tablet (4 mg total) by mouth every 8 (eight) hours as needed for nausea or vomiting. 20 tablet  0   pentosan polysulfate (ELMIRON) 100 MG capsule Take 100 mg by mouth 2 (two) times daily.     potassium chloride (K-DUR) 10 MEQ tablet Take 59m twice a day tomorrow, and then 242mtwice a day for two more days 16 tablet 0   predniSONE (DELTASONE) 10 MG tablet 4 tabs for 2 days, then 3 tabs for 2 days, 2 tabs for 2 days, then 1 tab for 2 days, then stop 20 tablet 0   Probiotic Product (PROBIOTIC DAILY PO) Take 1 capsule by mouth every evening.     promethazine (PHENERGAN) 12.5 MG tablet Take 12.5 mg by mouth every 6 (six) hours as needed for nausea.     rosuvastatin (CRESTOR) 10 MG tablet Take 10 mg by mouth every evening.      Zoster Vaccine Adjuvanted ([facility] Shingrix (PF) 50 mcg/0.5 mL intramuscular suspension, kit  PHARMACY ADMINISTERED     fluconazole (DIFLUCAN) 150 MG tablet      No facility-administered medications prior to visit.     Review of Systems:   Constitutional: No weight loss or gain, night sweats, fevers, chills, fatigue, or lassitude. HEENT: No headaches, difficulty swallowing, tooth/dental problems, or sore throat. No sneezing, itching, ear ache, nasal congestion, or post nasal drip CV:  No chest pain, orthopnea, PND, swelling in lower extremities, anasarca, dizziness, palpitations, syncope Resp: +shortness of breath with exertion; chest tightness (rare); wheezing (rare). No excess mucus or change in color of mucus. No productive or non-productive. No hemoptysis. No chest wall deformity GI:  No heartburn, indigestion, abdominal pain, nausea, vomiting, diarrhea, change in bowel habits, loss of appetite, bloody stools.  GU: +vaginal itching, discharge. No dysuria, change in color of urine, urgency or frequency.  Neuro: No dizziness or lightheadedness.  Psych: No depression or anxiety. Mood stable.   Observations/Objective: Patient is well-developed, well-nourished in no acute distress. Resting comfortably at home. A&Ox3. Unlabored breathing. Speech is clear  and coherent with logical content.    Assessment and Plan: Asthma Resolved asthma exacerbation. Clinically improved today. She does have some occasional wheezing and chest tightness that resolves with SABA. She was unable to obtain high dose Symbicort due to cost. We will keep her on the 80  mcg inhaler for right now but advised that if she has another flare, we will change her to an alternative higher dose ICS/LABA. Continue singulair for trigger prevention. Asthma action plan in place.   Patient Instructions  Continue Albuterol inhaler 2 puffs or 3 mL neb every 6 hours as needed for shortness of breath or wheezing. Notify if symptoms persist despite rescue inhaler/neb use.  Continue Symbicort 2 puffs Twice daily. Brush tongue and rinse mouth afterwards Continue singulair 1 tab At bedtime  Continue allegra 1 tab daily for allergies Continue Flonase nasal spray 2 sprays each nostril daily Saline nasal spray 2-3 times a day as needed for nasal congestion   Fluconazole 150 mg once. If symptoms persist after 72 hours, ok to repeat once  Follow up in 3 months with Dr. Chase Caller or Alanson Aly via virtual visit. If symptoms do not improve or worsen, please contact office for sooner follow up or seek emergency care.     Vaginal candidiasis Likely related to recent abx therapy. We will treat her with fluconazole. Advised her if symptoms don't improve, to go to urgent care or her PCP if back in [place].     I discussed the assessment and treatment plan with the patient. The patient was provided an opportunity to ask questions and all were answered. The patient agreed with the plan and demonstrated an understanding of the instructions.   The patient was advised to call back or seek an in-person evaluation if the symptoms worsen or if the condition fails to improve as anticipated.  I provided 31 minutes of non-face-to-face time during this encounter.   Clayton Bibles, NP

## 2022-05-01 NOTE — Assessment & Plan Note (Signed)
Likely related to recent abx therapy. We will treat her with fluconazole. Advised her if symptoms don't improve, to go to urgent care or her PCP if back in [place].

## 2022-05-01 NOTE — Progress Notes (Deleted)
_0  ID: Yolanda Yang, female    DOB: 14-Jun-1956, 66 y.o.   MRN: [id number]  Chief Complaint  Patient presents with   Follow-up    Pt f/u for asthma, she reports she is better other than chest tightness and a cough. She was unable to afford high dose Symbicort.    Referring provider: Iona Beard, MD  HPI: 66 year old female, former smoker followed for asthma and chronic cough. She is a patient of Dr. Golden Pop and last seen in office 08/09/2021 by Volanda Napoleon NP. Past medical history significant for allergic rhinitis, GERD, athritis, HLD, endometriosis s/p hysterectomy, anxiety, depression, chronic back pain, insomnia.   TEST/EVENTS:  04/26/2013 CT chest without contrast: No evidence of ILD.  No air trapping.  Lungs are clear.   12/16/2017 PFT: FVC 99, FEV1 79, ratio 72, TLC 109, DLCOunc 91.  Mild to moderate obstruction with reversibility (24% improvement after BD)  08/09/2021: OV with Volanda Napoleon NP for overdue follow-up/asthma.  Cough started back 1 year ago.  Splits her time between here in [place].  Comes back to [place] every 3 months for about a week.  Not currently on Symbicort.  Compliant with Dexilant, Allegra and Singulair.  Using albuterol inhaler every day and nebulizer every other day.  Reports that she had a chest x-ray in [place] and this was normal.  Feels like her cough gets worse when she is in the city.  FeNO 45. Advised her to restart her ICS/LABA with Symbicort 80.   04/16/2022: Today - acute Patient presents today for acute visit.  Around a month ago, she developed a persistent cough and some increased shortness of breath.  She also had sinus congestion and felt like she might be getting sick.  She was seen in urgent care in [place] and treated with Z-Pak and prednisone.  She initially felt somewhat better.  Unfortunately, over the last few weeks she started to feel worse again.  Cough is persistent, primarily dry.  Paroxysmal at times.  She also has chest tightness,  wheezing and increased shortness of breath from baseline.  She has tenderness on her sinuses and is producing yellow mucus every time she blows her nose.  She has some headaches.  Denies any fevers, chills, hemoptysis, known sick exposures, orthopnea, lower extremity swelling, sore throat.  She is currently using Symbicort 80 puffs twice a day.  Having to use her albuterol more frequently.  She takes Singulair and Allegra for allergies.  She is still splitting her time between here in [place].  She helps take care of her mother whenever she is up there so she spends more of her time there.  FeNO 30 ppb  Allergies  Allergen Reactions   Erythromycin Other (See Comments)    "Heart palpitation"   Latex Rash    Immunization History  Administered Date(s) Administered   Influenza Inj Mdck Quad Pf 04/06/2019   Influenza Split 03/26/2013, 07/01/2017   Influenza Whole 04/29/2006   Influenza,inj,Quad PF,6+ Mos 05/10/2011, 05/04/2012, 05/12/2014, 02/24/2017   Influenza-Unspecified 03/26/2013, 05/12/2014   Moderna Sars-Covid-2 Vaccination 08/28/2019, 09/25/2019, 01/03/2020   Td 12/05/2004   Td (Adult),5 Lf Tetanus Toxid, Preservative Free 12/05/2004   Unspecified SARS-COV-2 Vaccination 08/28/2019, 09/25/2019   Zoster Recombinat (Shingrix) 04/21/2019    Past Medical History:  Diagnosis Date   Anxiety    Asthma exacerbation    PULMOLOGIST-- DR RAMASWANY   Bladder pain    Borderline type 2 diabetes mellitus    DIET CONTROLLED  Cerebral aneurysm, nonruptured monitored at [facility] by dr Anne Shutter   currently has x4 stable aneurysm's (x2 left side & x2 right side)--  2012 s/p coiling of 4 aneurysm's   Chronic constipation    Cystitis, interstitial    Diabetes mellitus without complication (HCC)    GERD (gastroesophageal reflux disease)    Headache syndrome    due to cerebral aneurysm's   History of cerebral aneurysm repair    MULTIPLE  ANEURYSM--  S/P  COILING X4  ([facility])   History of  chronic sinusitis    History of duodenal ulcer    2007   History of gastric ulcer    History of GI bleed    UPPER--  2007  AND 05-2011   History of recurrent UTIs    Hyperlipemia    IBS (irritable bowel syndrome)    Incomplete emptying of bladder    OA (osteoarthritis)    Right foot drop    POST LUMBAR SURGERY 2011   Seasonal allergic rhinitis    Spinal headache 11/20/2018   Urinary hesitancy    Wears dentures    currently being fitted for full dentures   Wears glasses     Tobacco History: Social History   Tobacco Use  Smoking Status Former   Packs/day: 0.50   Years: 20.00   Total pack years: 10.00   Types: Cigarettes   Quit date: 08/02/2011   Years since quitting: 10.7  Smokeless Tobacco Never   Counseling given: Not Answered   Outpatient Medications Prior to Visit  Medication Sig Dispense Refill   albuterol (PROVENTIL) (2.5 MG/3ML) 0.083% nebulizer solution INHALE THE CONTENTS OF 1 VIAL IN NEBULIZER EVERY 12 HOURS AS NEEDED 75 mL 1   albuterol (VENTOLIN HFA) 108 (90 Base) MCG/ACT inhaler INHALE 2 PUFFS INTO THE LUNGS EVERY 4 HOURS AS NEEDED FOR WHEEZING OR SHORTNESS OF BREATH. 18 g 3   amoxicillin-clavulanate (AUGMENTIN) 875-125 MG tablet Take 1 tablet by mouth 2 (two) times daily. 14 tablet 0   aspirin 81 MG chewable tablet Chew 81 mg by mouth daily.      budesonide-formoterol (SYMBICORT) 160-4.5 MCG/ACT inhaler Inhale 2 puffs into the lungs in the morning and at bedtime. 1 each 5   butalbital-acetaminophen-caffeine (FIORICET, ESGIC) 50-325-40 MG per tablet Take 1 tablet by mouth daily as needed for headache.     chlorhexidine (PERIDEX) 0.12 % solution chlorhexidine gluconate 0.12 % mouthwash  RINSE FOR 30 SECONDS WITH 15 MLS EVERY MORNING AND EVERY EVENING FOR 2 WEEKS.     clotrimazole (MYCELEX) 10 MG troche      cyclobenzaprine (FLEXERIL) 10 MG tablet Take 10 mg by mouth 3 (three) times daily as needed for muscle spasms.     dexlansoprazole (DEXILANT) 60 MG capsule  Dexilant 60 mg capsule, delayed release     diazepam (VALIUM) 5 MG tablet Take 5 mg by mouth every 6 (six) hours as needed for anxiety.     dicyclomine (BENTYL) 20 MG tablet Take 1 tablet (20 mg total) by mouth 2 (two) times daily. 20 tablet 0   Diphenhyd-Hydrocort-Nystatin (FIRST-DUKES MOUTHWASH) SUSP Use as directed 5 mLs in the mouth or throat 3 (three) times daily as needed. 240 mL 0   DULoxetine (CYMBALTA) 60 MG capsule Take 60 mg by mouth every evening.      estradiol (VIVELLE-DOT) 0.1 MG/24HR patch Place 1 patch onto the skin 2 (two) times a week.     fexofenadine (ALLEGRA) 180 MG tablet Take 180 mg by mouth daily  as needed for allergies or rhinitis.     fluconazole (DIFLUCAN) 150 MG tablet      fluticasone (FLONASE) 50 MCG/ACT nasal spray Place 1 spray into both nostrils daily. 18.2 mL 2   gabapentin (NEURONTIN) 600 MG tablet Take 600 mg by mouth 3 (three) times daily.     guaiFENesin (MUCINEX) 600 MG 12 hr tablet Take by mouth.     HYDROmorphone (DILAUDID) 4 MG tablet Take 1-1.5 tablets (4-6 mg total) by mouth every 4 (four) hours as needed for severe pain. 50 tablet 0   linaclotide (LINZESS) 290 MCG CAPS capsule Take 290 mcg by mouth every evening.     Meth-Hyo-M Bl-Na Phos-Ph Sal (URIBEL) 118 MG CAPS Take 2 capsules by mouth 2 (two) times daily as needed (interstitial cystitis).     Meth-Hyo-M Bl-Na Phos-Ph Sal (URIBEL) 118 MG CAPS Take by mouth.     metroNIDAZOLE (METROGEL) 0.75 % vaginal gel      montelukast (SINGULAIR) 10 MG tablet TAKE 1 TABLET AT BEDTIME (NEED TO SCHEDULE APPOINTMENT WITH DR. Chase Caller) 90 tablet 3   morphine (KADIAN) 50 MG 24 hr capsule      nitrofurantoin, macrocrystal-monohydrate, (MACROBID) 100 MG capsule Take 100 mg by mouth daily as needed (flare up).     nystatin (MYCOSTATIN) 100000 UNIT/ML suspension Take 5 mLs (500,000 Units total) by mouth 4 (four) times daily. 60 mL 0   nystatin cream (MYCOSTATIN) Apply 1 application  topically at bedtime. PT USES PRN  QHS WHEN ON ANTIBIOTICS TO PREVENT YEAST INFECTION     nystatin-triamcinolone (MYCOLOG II) cream      ondansetron (ZOFRAN) 4 MG tablet Take 1 tablet (4 mg total) by mouth every 8 (eight) hours as needed for nausea or vomiting. 20 tablet 0   pentosan polysulfate (ELMIRON) 100 MG capsule Take 100 mg by mouth 2 (two) times daily.     potassium chloride (K-DUR) 10 MEQ tablet Take 47m twice a day tomorrow, and then 252mtwice a day for two more days 16 tablet 0   predniSONE (DELTASONE) 10 MG tablet 4 tabs for 2 days, then 3 tabs for 2 days, 2 tabs for 2 days, then 1 tab for 2 days, then stop 20 tablet 0   Probiotic Product (PROBIOTIC DAILY PO) Take 1 capsule by mouth every evening.     promethazine (PHENERGAN) 12.5 MG tablet Take 12.5 mg by mouth every 6 (six) hours as needed for nausea.     rosuvastatin (CRESTOR) 10 MG tablet Take 10 mg by mouth every evening.      Zoster Vaccine Adjuvanted ([facility] Shingrix (PF) 50 mcg/0.5 mL intramuscular suspension, kit  PHARMACY ADMINISTERED     No facility-administered medications prior to visit.     Review of Systems:   Constitutional: No weight loss or gain, night sweats, fevers, chills, fatigue, or lassitude. HEENT: No difficulty swallowing, tooth/dental problems, or sore throat. No sneezing, itching, ear ache. +sinus tenderness, nasal congestion, post nasal drip, headaches CV:  No chest pain, orthopnea, PND, swelling in lower extremities, anasarca, dizziness, palpitations, syncope Resp: +shortness of breath with exertion; wheezing; coughing. No excess mucus or change in color of mucus. No hemoptysis. No chest wall deformity GI:  No heartburn, indigestion, abdominal pain, nausea, vomiting, diarrhea, change in bowel habits, loss of appetite, bloody stools.  Skin: No rash, lesions, ulcerations Neuro: No dizziness or lightheadedness.  Psych: No depression or anxiety. Mood stable.     Physical Exam:  Ht _0  (1.651 m)   Wt  161 lb 6.4 oz  (73.2 kg)   BMI 26.86 kg/m   GEN: Pleasant, interactive, well-appearing; in no acute distress. HEENT:  Normocephalic and atraumatic. PERRLA. Sclera white. Nasal turbinates erythematous, moist and patent bilaterally. White rhinorrhea present. Oropharynx erythematous and moist, without exudate or edema. Maxillary sinus tenderness. No lesions, ulcerations NECK:  Supple w/ fair ROM. No JVD present. Normal carotid impulses w/o bruits. Thyroid symmetrical with no goiter or nodules palpated. No lymphadenopathy.   CV: RRR, no m/r/g, no peripheral edema. Pulses intact, +2 bilaterally. No cyanosis, pallor or clubbing. PULMONARY:  Unlabored, regular breathing. Mild end expiratory wheezes bilaterally A&P. No accessory muscle use.  GI: BS present and normoactive. Soft, non-tender to palpation. No organomegaly or masses detected.  MSK: No erythema, warmth or tenderness. No deformities or joint swelling noted.  Neuro: A/Ox3. No focal deficits noted.   Skin: Warm, no lesions or rashe Psych: Normal affect and behavior. Judgement and thought content appropriate.     Lab Results:  CBC    Component Value Date/Time   WBC 6.5 09/25/2017 1657   RBC 4.70 09/25/2017 1657   HGB 13.3 09/25/2017 1657   HCT 40.1 09/25/2017 1657   PLT 288.0 09/25/2017 1657   MCV 85.2 09/25/2017 1657   MCH 27.8 10/03/2016 1805   MCHC 33.3 09/25/2017 1657   RDW 14.4 09/25/2017 1657   LYMPHSABS 2.7 09/25/2017 1657   MONOABS 0.5 09/25/2017 1657   EOSABS 0.1 09/25/2017 1657   BASOSABS 0.0 09/25/2017 1657    BMET    Component Value Date/Time   NA 141 09/25/2017 1657   K 4.0 09/25/2017 1657   CL 102 09/25/2017 1657   CO2 31 09/25/2017 1657   GLUCOSE 81 09/25/2017 1657   BUN 5 (L) 09/25/2017 1657   CREATININE 0.60 09/25/2017 1657   CALCIUM 9.1 09/25/2017 1657   GFRNONAA >60 10/03/2016 1805   GFRAA >60 10/03/2016 1805    BNP No results found for: "BNP"   Imaging:  DG Chest 2 View  Result Date:  04/17/2022 CLINICAL DATA:  History of bronchitis. EXAM: CHEST - 2 VIEW COMPARISON:  Chest radiograph September 26, 2017 FINDINGS: The heart size and mediastinal contours are within normal limits. Both lungs are clear. The visualized skeletal structures are unremarkable. IMPRESSION: No active cardiopulmonary disease. Electronically Signed   By: Lovey Newcomer M.D.   On: 04/17/2022 14:48    methylPREDNISolone acetate (DEPO-MEDROL) injection 80 mg     Date Action Dose Route User   04/16/2022 1410 Given 80 mg Intramuscular (Right Ventrogluteal) Ronney Lion, Lorenso Courier, LPN          Latest Ref Rng & Units 12/16/2017   12:22 PM  PFT Results  FVC-Pre L 2.78   FVC-Predicted Pre % 99   FVC-Post L 3.03   FVC-Predicted Post % 109   Pre FEV1/FVC % % 63   Post FEV1/FCV % % 72   FEV1-Pre L 1.75   FEV1-Predicted Pre % 79   FEV1-Post L 2.18   DLCO uncorrected ml/min/mmHg 23.36   DLCO [facility]C% % 91   DLVA Predicted % 95   TLC L 5.69   TLC % Predicted % 109   RV % Predicted % 134     No results found for: "NITRICOXIDE"      Assessment & Plan:   No problem-specific Assessment & Plan notes found for this encounter.    I spent 35 minutes of dedicated to the care of this patient on the date of this encounter  to include pre-visit review of records, face-to-face time with the patient discussing conditions above, post visit ordering of testing, clinical documentation with the electronic health record, making appropriate referrals as documented, and communicating necessary findings to members of the patients care team.  Clayton Bibles, NP 05/01/2022  Pt aware and understands NP's role.

## 2022-05-01 NOTE — Assessment & Plan Note (Signed)
Resolved asthma exacerbation. Clinically improved today. She does have some occasional wheezing and chest tightness that resolves with SABA. She was unable to obtain high dose Symbicort due to cost. We will keep her on the 80 mcg inhaler for right now but advised that if she has another flare, we will change her to an alternative higher dose ICS/LABA. Continue singulair for trigger prevention. Asthma action plan in place.   Patient Instructions  Continue Albuterol inhaler 2 puffs or 3 mL neb every 6 hours as needed for shortness of breath or wheezing. Notify if symptoms persist despite rescue inhaler/neb use.  Continue Symbicort 2 puffs Twice daily. Brush tongue and rinse mouth afterwards Continue singulair 1 tab At bedtime  Continue allegra 1 tab daily for allergies Continue Flonase nasal spray 2 sprays each nostril daily Saline nasal spray 2-3 times a day as needed for nasal congestion   Fluconazole 150 mg once. If symptoms persist after 72 hours, ok to repeat once  Follow up in 3 months with Dr. Chase Caller or Alanson Aly via virtual visit. If symptoms do not improve or worsen, please contact office for sooner follow up or seek emergency care.

## 2022-05-01 NOTE — Patient Instructions (Addendum)
Continue Albuterol inhaler 2 puffs or 3 mL neb every 6 hours as needed for shortness of breath or wheezing. Notify if symptoms persist despite rescue inhaler/neb use.  Continue Symbicort 2 puffs Twice daily. Brush tongue and rinse mouth afterwards Continue singulair 1 tab At bedtime  Continue allegra 1 tab daily for allergies Continue Flonase nasal spray 2 sprays each nostril daily Saline nasal spray 2-3 times a day as needed for nasal congestion   Fluconazole 150 mg once. If symptoms persist after 72 hours, ok to repeat once  Follow up in 3 months with Dr. Chase Caller or Alanson Aly via virtual visit. If symptoms do not improve or worsen, please contact office for sooner follow up or seek emergency care.

## 2022-06-03 DIAGNOSIS — D122 Benign neoplasm of ascending colon: Secondary | ICD-10-CM | POA: Diagnosis not present

## 2022-06-03 DIAGNOSIS — Z1211 Encounter for screening for malignant neoplasm of colon: Secondary | ICD-10-CM | POA: Diagnosis not present

## 2022-06-03 DIAGNOSIS — K5730 Diverticulosis of large intestine without perforation or abscess without bleeding: Secondary | ICD-10-CM | POA: Diagnosis not present

## 2022-06-03 DIAGNOSIS — D124 Benign neoplasm of descending colon: Secondary | ICD-10-CM | POA: Diagnosis not present

## 2022-06-03 DIAGNOSIS — K635 Polyp of colon: Secondary | ICD-10-CM | POA: Diagnosis not present

## 2022-06-03 DIAGNOSIS — R195 Other fecal abnormalities: Secondary | ICD-10-CM | POA: Diagnosis not present

## 2022-06-03 DIAGNOSIS — D123 Benign neoplasm of transverse colon: Secondary | ICD-10-CM | POA: Diagnosis not present

## 2022-06-05 DIAGNOSIS — N3010 Interstitial cystitis (chronic) without hematuria: Secondary | ICD-10-CM | POA: Diagnosis not present

## 2022-06-05 DIAGNOSIS — G894 Chronic pain syndrome: Secondary | ICD-10-CM | POA: Diagnosis not present

## 2022-06-05 DIAGNOSIS — N9089 Other specified noninflammatory disorders of vulva and perineum: Secondary | ICD-10-CM | POA: Diagnosis not present

## 2022-06-05 DIAGNOSIS — N9489 Other specified conditions associated with female genital organs and menstrual cycle: Secondary | ICD-10-CM | POA: Diagnosis not present

## 2022-06-05 DIAGNOSIS — N3941 Urge incontinence: Secondary | ICD-10-CM | POA: Diagnosis not present

## 2022-06-29 DIAGNOSIS — E785 Hyperlipidemia, unspecified: Secondary | ICD-10-CM | POA: Diagnosis not present

## 2022-06-29 DIAGNOSIS — I10 Essential (primary) hypertension: Secondary | ICD-10-CM | POA: Diagnosis not present

## 2022-06-29 DIAGNOSIS — J449 Chronic obstructive pulmonary disease, unspecified: Secondary | ICD-10-CM | POA: Diagnosis not present

## 2022-07-11 DIAGNOSIS — Z1152 Encounter for screening for COVID-19: Secondary | ICD-10-CM | POA: Diagnosis not present

## 2022-07-11 DIAGNOSIS — Z1159 Encounter for screening for other viral diseases: Secondary | ICD-10-CM | POA: Diagnosis not present

## 2022-07-11 DIAGNOSIS — U071 COVID-19: Secondary | ICD-10-CM | POA: Diagnosis not present

## 2022-07-12 DIAGNOSIS — R059 Cough, unspecified: Secondary | ICD-10-CM | POA: Diagnosis not present

## 2022-07-12 DIAGNOSIS — R6883 Chills (without fever): Secondary | ICD-10-CM | POA: Diagnosis not present

## 2022-07-12 DIAGNOSIS — R509 Fever, unspecified: Secondary | ICD-10-CM | POA: Diagnosis not present

## 2022-07-12 DIAGNOSIS — Z20822 Contact with and (suspected) exposure to covid-19: Secondary | ICD-10-CM | POA: Diagnosis not present

## 2022-07-13 DIAGNOSIS — R509 Fever, unspecified: Secondary | ICD-10-CM | POA: Diagnosis not present

## 2022-07-13 DIAGNOSIS — R059 Cough, unspecified: Secondary | ICD-10-CM | POA: Diagnosis not present

## 2022-07-13 DIAGNOSIS — R6883 Chills (without fever): Secondary | ICD-10-CM | POA: Diagnosis not present

## 2022-07-13 DIAGNOSIS — Z20822 Contact with and (suspected) exposure to covid-19: Secondary | ICD-10-CM | POA: Diagnosis not present

## 2022-07-23 DIAGNOSIS — R6883 Chills (without fever): Secondary | ICD-10-CM | POA: Diagnosis not present

## 2022-07-23 DIAGNOSIS — R509 Fever, unspecified: Secondary | ICD-10-CM | POA: Diagnosis not present

## 2022-07-23 DIAGNOSIS — R059 Cough, unspecified: Secondary | ICD-10-CM | POA: Diagnosis not present

## 2022-07-23 DIAGNOSIS — Z20822 Contact with and (suspected) exposure to covid-19: Secondary | ICD-10-CM | POA: Diagnosis not present

## 2022-07-25 DIAGNOSIS — R6883 Chills (without fever): Secondary | ICD-10-CM | POA: Diagnosis not present

## 2022-07-25 DIAGNOSIS — R509 Fever, unspecified: Secondary | ICD-10-CM | POA: Diagnosis not present

## 2022-07-25 DIAGNOSIS — Z20822 Contact with and (suspected) exposure to covid-19: Secondary | ICD-10-CM | POA: Diagnosis not present

## 2022-07-25 DIAGNOSIS — R059 Cough, unspecified: Secondary | ICD-10-CM | POA: Diagnosis not present

## 2022-07-27 DIAGNOSIS — Z20822 Contact with and (suspected) exposure to covid-19: Secondary | ICD-10-CM | POA: Diagnosis not present

## 2022-07-27 DIAGNOSIS — R6883 Chills (without fever): Secondary | ICD-10-CM | POA: Diagnosis not present

## 2022-07-27 DIAGNOSIS — R059 Cough, unspecified: Secondary | ICD-10-CM | POA: Diagnosis not present

## 2022-07-27 DIAGNOSIS — R509 Fever, unspecified: Secondary | ICD-10-CM | POA: Diagnosis not present

## 2022-07-29 DIAGNOSIS — R6883 Chills (without fever): Secondary | ICD-10-CM | POA: Diagnosis not present

## 2022-07-29 DIAGNOSIS — Z20822 Contact with and (suspected) exposure to covid-19: Secondary | ICD-10-CM | POA: Diagnosis not present

## 2022-07-29 DIAGNOSIS — R059 Cough, unspecified: Secondary | ICD-10-CM | POA: Diagnosis not present

## 2022-07-29 DIAGNOSIS — R509 Fever, unspecified: Secondary | ICD-10-CM | POA: Diagnosis not present

## 2022-07-31 DIAGNOSIS — R059 Cough, unspecified: Secondary | ICD-10-CM | POA: Diagnosis not present

## 2022-07-31 DIAGNOSIS — R509 Fever, unspecified: Secondary | ICD-10-CM | POA: Diagnosis not present

## 2022-07-31 DIAGNOSIS — Z20822 Contact with and (suspected) exposure to covid-19: Secondary | ICD-10-CM | POA: Diagnosis not present

## 2022-07-31 DIAGNOSIS — R6883 Chills (without fever): Secondary | ICD-10-CM | POA: Diagnosis not present

## 2022-08-02 DIAGNOSIS — R6883 Chills (without fever): Secondary | ICD-10-CM | POA: Diagnosis not present

## 2022-08-02 DIAGNOSIS — R509 Fever, unspecified: Secondary | ICD-10-CM | POA: Diagnosis not present

## 2022-08-02 DIAGNOSIS — R059 Cough, unspecified: Secondary | ICD-10-CM | POA: Diagnosis not present

## 2022-08-02 DIAGNOSIS — Z20822 Contact with and (suspected) exposure to covid-19: Secondary | ICD-10-CM | POA: Diagnosis not present

## 2022-08-09 DIAGNOSIS — Z20822 Contact with and (suspected) exposure to covid-19: Secondary | ICD-10-CM | POA: Diagnosis not present

## 2022-08-09 DIAGNOSIS — R059 Cough, unspecified: Secondary | ICD-10-CM | POA: Diagnosis not present

## 2022-08-09 DIAGNOSIS — R6883 Chills (without fever): Secondary | ICD-10-CM | POA: Diagnosis not present

## 2022-08-09 DIAGNOSIS — R509 Fever, unspecified: Secondary | ICD-10-CM | POA: Diagnosis not present

## 2022-08-11 ENCOUNTER — Other Ambulatory Visit: Payer: Self-pay | Admitting: Nurse Practitioner

## 2022-08-11 DIAGNOSIS — R509 Fever, unspecified: Secondary | ICD-10-CM | POA: Diagnosis not present

## 2022-08-11 DIAGNOSIS — R059 Cough, unspecified: Secondary | ICD-10-CM | POA: Diagnosis not present

## 2022-08-11 DIAGNOSIS — B3731 Acute candidiasis of vulva and vagina: Secondary | ICD-10-CM

## 2022-08-11 DIAGNOSIS — Z20822 Contact with and (suspected) exposure to covid-19: Secondary | ICD-10-CM | POA: Diagnosis not present

## 2022-08-11 DIAGNOSIS — R6883 Chills (without fever): Secondary | ICD-10-CM | POA: Diagnosis not present

## 2022-09-03 DIAGNOSIS — G8929 Other chronic pain: Secondary | ICD-10-CM | POA: Diagnosis not present

## 2022-09-03 DIAGNOSIS — F4323 Adjustment disorder with mixed anxiety and depressed mood: Secondary | ICD-10-CM | POA: Diagnosis not present

## 2022-09-03 DIAGNOSIS — M21371 Foot drop, right foot: Secondary | ICD-10-CM | POA: Diagnosis not present

## 2022-09-03 DIAGNOSIS — E785 Hyperlipidemia, unspecified: Secondary | ICD-10-CM | POA: Diagnosis not present

## 2022-09-03 DIAGNOSIS — N3010 Interstitial cystitis (chronic) without hematuria: Secondary | ICD-10-CM | POA: Diagnosis not present

## 2022-09-03 DIAGNOSIS — J4541 Moderate persistent asthma with (acute) exacerbation: Secondary | ICD-10-CM | POA: Diagnosis not present

## 2022-09-03 DIAGNOSIS — M5441 Lumbago with sciatica, right side: Secondary | ICD-10-CM | POA: Diagnosis not present

## 2022-09-03 DIAGNOSIS — J449 Chronic obstructive pulmonary disease, unspecified: Secondary | ICD-10-CM | POA: Diagnosis not present

## 2022-09-03 DIAGNOSIS — M5442 Lumbago with sciatica, left side: Secondary | ICD-10-CM | POA: Diagnosis not present

## 2022-09-03 DIAGNOSIS — E7800 Pure hypercholesterolemia, unspecified: Secondary | ICD-10-CM | POA: Diagnosis not present

## 2022-09-04 ENCOUNTER — Ambulatory Visit (INDEPENDENT_AMBULATORY_CARE_PROVIDER_SITE_OTHER): Payer: Medicare HMO | Admitting: Nurse Practitioner

## 2022-09-04 ENCOUNTER — Encounter: Payer: Self-pay | Admitting: Nurse Practitioner

## 2022-09-04 ENCOUNTER — Ambulatory Visit (INDEPENDENT_AMBULATORY_CARE_PROVIDER_SITE_OTHER): Payer: Medicare HMO

## 2022-09-04 VITALS — BP 134/64 | HR 68 | Temp 98.0°F | Ht 65.0 in | Wt 162.6 lb

## 2022-09-04 DIAGNOSIS — R059 Cough, unspecified: Secondary | ICD-10-CM | POA: Diagnosis not present

## 2022-09-04 DIAGNOSIS — U071 COVID-19: Secondary | ICD-10-CM

## 2022-09-04 DIAGNOSIS — J4541 Moderate persistent asthma with (acute) exacerbation: Secondary | ICD-10-CM

## 2022-09-04 DIAGNOSIS — J019 Acute sinusitis, unspecified: Secondary | ICD-10-CM | POA: Diagnosis not present

## 2022-09-04 DIAGNOSIS — B9689 Other specified bacterial agents as the cause of diseases classified elsewhere: Secondary | ICD-10-CM | POA: Diagnosis not present

## 2022-09-04 DIAGNOSIS — J189 Pneumonia, unspecified organism: Secondary | ICD-10-CM | POA: Diagnosis not present

## 2022-09-04 MED ORDER — PREDNISONE 10 MG PO TABS: ORAL_TABLET | ORAL | 0 refills | Status: DC

## 2022-09-04 MED ORDER — PROMETHAZINE-DM 6.25-15 MG/5ML PO SYRP: 5.0000 mL | ORAL_SOLUTION | Freq: Four times a day (QID) | ORAL | 0 refills | Status: AC | PRN

## 2022-09-04 MED ORDER — AMOXICILLIN-POT CLAVULANATE 875-125 MG PO TABS: 1.0000 | ORAL_TABLET | Freq: Two times a day (BID) | ORAL | 0 refills | Status: AC

## 2022-09-04 MED ORDER — AZELASTINE HCL 0.1 % NA SOLN: 2.0000 | Freq: Two times a day (BID) | NASAL | 5 refills | Status: AC

## 2022-09-04 MED ORDER — BENZONATATE 200 MG PO CAPS: 200.0000 mg | ORAL_CAPSULE | Freq: Three times a day (TID) | ORAL | 1 refills | Status: AC | PRN

## 2022-09-04 NOTE — Progress Notes (Signed)
$'@Patient'g$  ID: Yolanda Yang, female    DOB: September 14, 1955, 67 y.o.   MRN: [id number]  Chief Complaint  Patient presents with   Follow-up    Sob, cough. Pt states she had covid in January. Experiencing chest tightness     Referring provider: Iona Beard, MD  HPI: 67 year old female, former smoker followed for asthma and chronic cough. She is a patient of Dr. Golden Pop and last seen in office 05/01/2022 by Belenda Cruise NP. Past medical history significant for allergic rhinitis, GERD, athritis, HLD, endometriosis s/p hysterectomy, anxiety, depression, chronic back pain, insomnia.   TEST/EVENTS:  04/26/2013 CT chest without contrast: No evidence of ILD.  No air trapping.  Lungs are clear.   12/16/2017 PFT: FVC 99, FEV1 79, ratio 72, TLC 109, DLCOunc 91.  Mild to moderate obstruction with reversibility (24% improvement after BD) 04/16/2022 CXR: lungs are clear  04/16/2022: OV with Marvis Bakken NP acute visit.  Around a month ago, she developed a persistent cough and some increased shortness of breath.  She also had sinus congestion and felt like she might be getting sick.  She was seen in urgent care in [place] and treated with Z-Pak and prednisone.  She initially felt somewhat better.  Unfortunately, over the last few weeks she started to feel worse again.  Cough is persistent, primarily dry.  Paroxysmal at times.  She also has chest tightness, wheezing and increased shortness of breath from baseline.  She has tenderness on her sinuses and is producing yellow mucus every time she blows her nose.  She has some headaches.  Denies any fevers, chills, hemoptysis, known sick exposures, orthopnea, lower extremity swelling, sore throat.  She is currently using Symbicort 82 puffs twice a day.  Having to use her albuterol more frequently.  She takes Singulair and Allegra for allergies.  She is still splitting her time between here in [place].  She helps take care of her mother whenever she is up there so she spends more  of her time there. FeNO 30 ppb  05/01/2022: Virtual visit with Mikyle Sox NP. Improved and feels much better. Still has some occasional chest tightness and wheezing; clears with inhaler. Never received high dose Symbicort; insurance wouldn't cover it. Possible yeast infection.  09/04/2022: Today - acute Patient presents today for acute visit. She had COVID in January. Since then, she has been struggling with persistent sinus symptoms and a productive cough. She has been producing gray color phlegm with her cough and occasionally has some chest discomfort with coughing. Feeling more short winded and notices more wheezing. She has a lot of nasal congestion and facial pain. Has to clear her throat frequently due to postnasal drainage. Denies fevers, chills, hemoptysis, difficulties swallowing. Eating and drinking well. Doing saline rinses and nasal sprays. Using her symbicort twice daily. Using albuterol a few times a day.   Allergies  Allergen Reactions   Erythromycin Other (See Comments)    "Heart palpitation"   Latex Rash    Immunization History  Administered Date(s) Administered   Influenza Inj Mdck Quad Pf 04/06/2019   Influenza Split 03/26/2013, 07/01/2017   Influenza Whole 04/29/2006   Influenza,inj,Quad PF,6+ Mos 05/10/2011, 05/04/2012, 05/12/2014, 02/24/2017   Influenza-Unspecified 03/26/2013, 05/12/2014   Moderna Sars-Covid-2 Vaccination 08/28/2019, 09/25/2019, 01/03/2020   Td 12/05/2004   Td (Adult),5 Lf Tetanus Toxid, Preservative Free 12/05/2004   Unspecified SARS-COV-2 Vaccination 08/28/2019, 09/25/2019   Zoster Recombinat (Shingrix) 04/21/2019    Past Medical History:  Diagnosis Date   Anxiety  Asthma exacerbation    PULMOLOGIST-- DR RAMASWANY   Bladder pain    Borderline type 2 diabetes mellitus    DIET CONTROLLED   Cerebral aneurysm, nonruptured monitored at [facility] by dr Anne Shutter   currently has x4 stable aneurysm's (x2 left side & x2 right side)--  2012 s/p coiling  of 4 aneurysm's   Chronic constipation    Cystitis, interstitial    Diabetes mellitus without complication (HCC)    GERD (gastroesophageal reflux disease)    Headache syndrome    due to cerebral aneurysm's   History of cerebral aneurysm repair    MULTIPLE  ANEURYSM--  S/P  COILING X4  ([facility])   History of chronic sinusitis    History of duodenal ulcer    2007   History of gastric ulcer    History of GI bleed    UPPER--  2007  AND 05-2011   History of recurrent UTIs    Hyperlipemia    IBS (irritable bowel syndrome)    Incomplete emptying of bladder    OA (osteoarthritis)    Right foot drop    POST LUMBAR SURGERY 2011   Seasonal allergic rhinitis    Spinal headache 11/20/2018   Urinary hesitancy    Wears dentures    currently being fitted for full dentures   Wears glasses     Tobacco History: Social History   Tobacco Use  Smoking Status Former   Packs/day: 0.50   Years: 20.00   Total pack years: 10.00   Types: Cigarettes   Quit date: 08/02/2011   Years since quitting: 11.0  Smokeless Tobacco Never   Counseling given: Not Answered   Outpatient Medications Prior to Visit  Medication Sig Dispense Refill   albuterol (PROVENTIL) (2.5 MG/3ML) 0.083% nebulizer solution INHALE THE CONTENTS OF 1 VIAL IN NEBULIZER EVERY 12 HOURS AS NEEDED 75 mL 1   albuterol (VENTOLIN HFA) 108 (90 Base) MCG/ACT inhaler INHALE 2 PUFFS INTO THE LUNGS EVERY 4 HOURS AS NEEDED FOR WHEEZING OR SHORTNESS OF BREATH. 18 g 3   aspirin 81 MG chewable tablet Chew 81 mg by mouth daily.      budesonide-formoterol (SYMBICORT) 160-4.5 MCG/ACT inhaler Inhale 2 puffs into the lungs in the morning and at bedtime. 1 each 5   diazepam (VALIUM) 5 MG tablet Take 5 mg by mouth every 6 (six) hours as needed for anxiety.     DULoxetine (CYMBALTA) 60 MG capsule Take 60 mg by mouth every evening.      fexofenadine (ALLEGRA) 180 MG tablet Take 180 mg by mouth daily as needed for allergies or rhinitis.     fluticasone  (FLONASE) 50 MCG/ACT nasal spray Place 1 spray into both nostrils daily. 18.2 mL 2   gabapentin (NEURONTIN) 600 MG tablet Take 600 mg by mouth 3 (three) times daily.     guaiFENesin (MUCINEX) 600 MG 12 hr tablet Take by mouth.     HYDROmorphone (DILAUDID) 4 MG tablet Take 1-1.5 tablets (4-6 mg total) by mouth every 4 (four) hours as needed for severe pain. 50 tablet 0   linaclotide (LINZESS) 290 MCG CAPS capsule Take 290 mcg by mouth every evening.     Meth-Hyo-M Bl-Na Phos-Ph Sal (URIBEL) 118 MG CAPS Take 2 capsules by mouth 2 (two) times daily as needed (interstitial cystitis).     Meth-Hyo-M Bl-Na Phos-Ph Sal (URIBEL) 118 MG CAPS Take by mouth.     metroNIDAZOLE (METROGEL) 0.75 % vaginal gel      montelukast (SINGULAIR) 10  MG tablet TAKE 1 TABLET AT BEDTIME (NEED TO SCHEDULE APPOINTMENT WITH DR. Chase Caller) 90 tablet 3   morphine (KADIAN) 50 MG 24 hr capsule      nitrofurantoin, macrocrystal-monohydrate, (MACROBID) 100 MG capsule Take 100 mg by mouth daily as needed (flare up).     nystatin (MYCOSTATIN) 100000 UNIT/ML suspension Take 5 mLs (500,000 Units total) by mouth 4 (four) times daily. 60 mL 0   nystatin cream (MYCOSTATIN) Apply 1 application  topically at bedtime. PT USES PRN QHS WHEN ON ANTIBIOTICS TO PREVENT YEAST INFECTION     nystatin-triamcinolone (MYCOLOG II) cream      ondansetron (ZOFRAN) 4 MG tablet Take 1 tablet (4 mg total) by mouth every 8 (eight) hours as needed for nausea or vomiting. 20 tablet 0   pentosan polysulfate (ELMIRON) 100 MG capsule Take 100 mg by mouth 2 (two) times daily.     potassium chloride (K-DUR) 10 MEQ tablet Take '40mg'$  twice a day tomorrow, and then '20mg'$  twice a day for two more days 16 tablet 0   Probiotic Product (PROBIOTIC DAILY PO) Take 1 capsule by mouth every evening.     promethazine (PHENERGAN) 12.5 MG tablet Take 12.5 mg by mouth every 6 (six) hours as needed for nausea.     rosuvastatin (CRESTOR) 10 MG tablet Take 10 mg by mouth every evening.       Zoster Vaccine Adjuvanted [facility]) injection Shingrix (PF) 50 mcg/0.5 mL intramuscular suspension, kit  PHARMACY ADMINISTERED     amoxicillin-clavulanate (AUGMENTIN) 875-125 MG tablet Take 1 tablet by mouth 2 (two) times daily. 14 tablet 0   predniSONE (DELTASONE) 10 MG tablet 4 tabs for 2 days, then 3 tabs for 2 days, 2 tabs for 2 days, then 1 tab for 2 days, then stop 20 tablet 0   butalbital-acetaminophen-caffeine (FIORICET, ESGIC) 50-325-40 MG per tablet Take 1 tablet by mouth daily as needed for headache. (Patient not taking: Reported on 09/04/2022)     chlorhexidine (PERIDEX) 0.12 % solution chlorhexidine gluconate 0.12 % mouthwash  RINSE FOR 30 SECONDS WITH 15 MLS EVERY MORNING AND EVERY EVENING FOR 2 WEEKS. (Patient not taking: Reported on 09/04/2022)     clotrimazole (MYCELEX) 10 MG troche  (Patient not taking: Reported on 09/04/2022)     cyclobenzaprine (FLEXERIL) 10 MG tablet Take 10 mg by mouth 3 (three) times daily as needed for muscle spasms. (Patient not taking: Reported on 09/04/2022)     dexlansoprazole (DEXILANT) 60 MG capsule Dexilant 60 mg capsule, delayed release (Patient not taking: Reported on 09/04/2022)     dicyclomine (BENTYL) 20 MG tablet Take 1 tablet (20 mg total) by mouth 2 (two) times daily. (Patient not taking: Reported on 09/04/2022) 20 tablet 0   Diphenhyd-Hydrocort-Nystatin (FIRST-DUKES MOUTHWASH) SUSP Use as directed 5 mLs in the mouth or throat 3 (three) times daily as needed. (Patient not taking: Reported on 09/04/2022) 240 mL 0   estradiol (VIVELLE-DOT) 0.1 MG/24HR patch Place 1 patch onto the skin 2 (two) times a week. (Patient not taking: Reported on 09/04/2022)     No facility-administered medications prior to visit.     Review of Systems:   Constitutional: No weight loss or gain, night sweats, fevers, chills, fatigue, or lassitude. HEENT: No headaches, difficulty swallowing, tooth/dental problems, or sore throat. No sneezing, itching, ear ache. +sinus tenderness,  nasal congestion, post nasal drip,  CV:  No chest pain, orthopnea, PND, swelling in lower extremities, anasarca, dizziness, palpitations, syncope Resp: +shortness of breath with exertion; wheezing; productive coughing.  No hemoptysis. No chest wall deformity GI:  No heartburn, indigestion, abdominal pain, nausea, vomiting, diarrhea, change in bowel habits, loss of appetite, bloody stools.  Skin: No rash, lesions, ulcerations Neuro: No dizziness or lightheadedness.  Psych: No depression or anxiety. Mood stable.     Physical Exam:  BP 134/64   Pulse 68   Temp 98 F (36.7 C) (Oral)   Ht '5\' 5"'$  (1.651 m)   Wt 162 lb 9.6 oz (73.8 kg)   SpO2 99%   BMI 27.06 kg/m   GEN: Pleasant, interactive, well-appearing; in no acute distress. HEENT:  Normocephalic and atraumatic. PERRLA. Sclera white. Nasal turbinates erythematous, moist and patent bilaterally. White rhinorrhea present. Oropharynx erythematous and moist, without exudate or edema. Maxillary sinus tenderness. No lesions, ulcerations NECK:  Supple w/ fair ROM. No JVD present. Normal carotid impulses w/o bruits. Thyroid symmetrical with no goiter or nodules palpated. No lymphadenopathy.   CV: RRR, no m/r/g, no peripheral edema. Pulses intact, +2 bilaterally. No cyanosis, pallor or clubbing. PULMONARY:  Unlabored, regular breathing. Clear bilaterally A&P w/o wheezes/rales/rhonchi. Bronchitic cough. No accessory muscle use.  GI: BS present and normoactive. Soft, non-tender to palpation. No organomegaly or masses detected.  MSK: No erythema, warmth or tenderness. No deformities or joint swelling noted.  Neuro: A/Ox3. No focal deficits noted.   Skin: Warm, no lesions or rashe Psych: Normal affect and behavior. Judgement and thought content appropriate.     Lab Results:  CBC    Component Value Date/Time   WBC 6.5 09/25/2017 1657   RBC 4.70 09/25/2017 1657   HGB 13.3 09/25/2017 1657   HCT 40.1 09/25/2017 1657   PLT 288.0 09/25/2017 1657    MCV 85.2 09/25/2017 1657   MCH 27.8 10/03/2016 1805   MCHC 33.3 09/25/2017 1657   RDW 14.4 09/25/2017 1657   LYMPHSABS 2.7 09/25/2017 1657   MONOABS 0.5 09/25/2017 1657   EOSABS 0.1 09/25/2017 1657   BASOSABS 0.0 09/25/2017 1657    BMET    Component Value Date/Time   NA 141 09/25/2017 1657   K 4.0 09/25/2017 1657   CL 102 09/25/2017 1657   CO2 31 09/25/2017 1657   GLUCOSE 81 09/25/2017 1657   BUN 5 (L) 09/25/2017 1657   CREATININE 0.60 09/25/2017 1657   CALCIUM 9.1 09/25/2017 1657   GFRNONAA >60 10/03/2016 1805   GFRAA >60 10/03/2016 1805    BNP No results found for: "BNP"   Imaging:  DG Chest 2 View  Result Date: 09/04/2022 CLINICAL DATA:  Post COVID pneumonia, persistent productive cough. EXAM: CHEST - 2 VIEW COMPARISON:  04/16/2022 FINDINGS: Lungs are well expanded, symmetric, and clear. No pneumothorax or pleural effusion. Cardiac size within normal limits. Pulmonary vascularity is normal. Osseous structures are age-appropriate. Postsurgical and asymmetric degenerative changes are seen within the right shoulder. Cervical fusion hardware partially visualized. No acute bone abnormality. IMPRESSION: 1. No active cardiopulmonary disease. Electronically Signed   By: Fidela Salisbury M.D.   On: 09/04/2022 15:02          Latest Ref Rng & Units 12/16/2017   12:22 PM  PFT Results  FVC-Pre L 2.78   FVC-Predicted Pre % 99   FVC-Post L 3.03   FVC-Predicted Post % 109   Pre FEV1/FVC % % 63   Post FEV1/FCV % % 72   FEV1-Pre L 1.75   FEV1-Predicted Pre % 79   FEV1-Post L 2.18   DLCO uncorrected ml/min/mmHg 23.36   DLCO UNC% % 91   DLVA Predicted %  95   TLC L 5.69   TLC % Predicted % 109   RV % Predicted % 134     No results found for: "NITRICOXIDE"      Assessment & Plan:   Asthmatic bronchitis Exacerbation of asthmatic bronchitis. FeNO slightly elevated today. Suspect upper airway component due to significant postnasal drainage. Given persistency and length of  symptoms, we will treat her with depo inj and prednisone taper. Cough control measures advised. Non-toxic and VS stable on room air. CXR to rule out superimposed infection. Action plan in place.  Patient Instructions  Continue Albuterol inhaler 2 puffs or 3 mL neb every 6 hours as needed for shortness of breath or wheezing. Notify if symptoms persist despite rescue inhaler/neb use.  Continue Symbicort 2 puffs Twice daily. Brush tongue and rinse mouth afterwards Continue singulair 1 tab At bedtime  Continue allegra 1 tab daily for allergies Continue Flonase nasal spray 2 sprays each nostril daily Saline nasal spray 2-3 times a day as needed for nasal congestion  Augmentin 1 tab Twice daily for 7 days. Take with food. Take a daily probiotic or eat yogurt while taking Prednisone taper. 4 tabs for 2 days, then 3 tabs for 2 days, 2 tabs for 2 days, then 1 tab for 2 days, then stop. Take in AM with food. Start tomorrow Benzonatate 1 capsule Three times a day for cough. Use consistently over the next 3-4 days  Promethazine DM cough syrup 5 mL every 6 hours as needed for cough. May cause drowsiness. Do not drive after taking Astelin nasal spray 2 sprays each nostril Twice daily until symptoms improve  Upper airway cough syndrome: Suppress your cough to allow your larynx (voice box) to heal.  Limit talking for the next few days. Avoid throat clearing. Work on cough suppression with the above recommended suppressants.  Use sugar free hard candies or non-menthol cough drops during this time to soothe your throat.  Warm tea with honey and lemon.   Follow up in 3 months with Dr. Chase Caller or Alanson Aly. If symptoms do not improve or worsen, please contact office for sooner follow up or seek emergency care.    Acute bacterial rhinosinusitis COVID infection January 2023. Persistent sinus symptoms with purulent drainage. We will treat her for bacterial coinfection with empiric augmentin course. Supportive  care measures advised. See above.     I spent 35 minutes of dedicated to the care of this patient on the date of this encounter to include pre-visit review of records, face-to-face time with the patient discussing conditions above, post visit ordering of testing, clinical documentation with the electronic health record, making appropriate referrals as documented, and communicating necessary findings to members of the patients care team.  Clayton Bibles, NP 09/04/2022  Pt aware and understands NP's role.

## 2022-09-04 NOTE — Assessment & Plan Note (Signed)
COVID infection January 2023. Persistent sinus symptoms with purulent drainage. We will treat her for bacterial coinfection with empiric augmentin course. Supportive care measures advised. See above.

## 2022-09-04 NOTE — Assessment & Plan Note (Addendum)
Exacerbation of asthmatic bronchitis. FeNO slightly elevated today. Suspect upper airway component due to significant postnasal drainage. Given persistency and length of symptoms, we will treat her with depo inj and prednisone taper. Cough control measures advised. Non-toxic and VS stable on room air. CXR to rule out superimposed infection. Action plan in place.  Patient Instructions  Continue Albuterol inhaler 2 puffs or 3 mL neb every 6 hours as needed for shortness of breath or wheezing. Notify if symptoms persist despite rescue inhaler/neb use.  Continue Symbicort 2 puffs Twice daily. Brush tongue and rinse mouth afterwards Continue singulair 1 tab At bedtime  Continue allegra 1 tab daily for allergies Continue Flonase nasal spray 2 sprays each nostril daily Saline nasal spray 2-3 times a day as needed for nasal congestion  Augmentin 1 tab Twice daily for 7 days. Take with food. Take a daily probiotic or eat yogurt while taking Prednisone taper. 4 tabs for 2 days, then 3 tabs for 2 days, 2 tabs for 2 days, then 1 tab for 2 days, then stop. Take in AM with food. Start tomorrow Benzonatate 1 capsule Three times a day for cough. Use consistently over the next 3-4 days  Promethazine DM cough syrup 5 mL every 6 hours as needed for cough. May cause drowsiness. Do not drive after taking Astelin nasal spray 2 sprays each nostril Twice daily until symptoms improve  Upper airway cough syndrome: Suppress your cough to allow your larynx (voice box) to heal.  Limit talking for the next few days. Avoid throat clearing. Work on cough suppression with the above recommended suppressants.  Use sugar free hard candies or non-menthol cough drops during this time to soothe your throat.  Warm tea with honey and lemon.   Follow up in 3 months with Dr. Chase Caller or Alanson Aly. If symptoms do not improve or worsen, please contact office for sooner follow up or seek emergency care.

## 2022-09-04 NOTE — Patient Instructions (Addendum)
Continue Albuterol inhaler 2 puffs or 3 mL neb every 6 hours as needed for shortness of breath or wheezing. Notify if symptoms persist despite rescue inhaler/neb use.  Continue Symbicort 2 puffs Twice daily. Brush tongue and rinse mouth afterwards Continue singulair 1 tab At bedtime  Continue allegra 1 tab daily for allergies Continue Flonase nasal spray 2 sprays each nostril daily Saline nasal spray 2-3 times a day as needed for nasal congestion  Augmentin 1 tab Twice daily for 7 days. Take with food. Take a daily probiotic or eat yogurt while taking Prednisone taper. 4 tabs for 2 days, then 3 tabs for 2 days, 2 tabs for 2 days, then 1 tab for 2 days, then stop. Take in AM with food. Start tomorrow Benzonatate 1 capsule Three times a day for cough. Use consistently over the next 3-4 days  Promethazine DM cough syrup 5 mL every 6 hours as needed for cough. May cause drowsiness. Do not drive after taking Astelin nasal spray 2 sprays each nostril Twice daily until symptoms improve  Upper airway cough syndrome: Suppress your cough to allow your larynx (voice box) to heal.  Limit talking for the next few days. Avoid throat clearing. Work on cough suppression with the above recommended suppressants.  Use sugar free hard candies or non-menthol cough drops during this time to soothe your throat.  Warm tea with honey and lemon.   Follow up in 3 months with Dr. Chase Caller or Alanson Aly. If symptoms do not improve or worsen, please contact office for sooner follow up or seek emergency care.

## 2022-10-14 ENCOUNTER — Telehealth: Payer: Self-pay | Admitting: Internal Medicine

## 2022-10-14 MED ORDER — DOXYCYCLINE HYCLATE 100 MG PO TABS: 100.0000 mg | ORAL_TABLET | Freq: Two times a day (BID) | ORAL | 0 refills | Status: AC

## 2022-10-14 MED ORDER — PREDNISONE 10 MG PO TABS: ORAL_TABLET | ORAL | 0 refills | Status: AC

## 2022-10-14 NOTE — Telephone Encounter (Signed)
Take doxycycline 100mg  po twice daily x 7 days; take after meals and avoid sunlight   If she wants she can do some pred  -Please take prednisone 40 mg x1 day, then 30 mg x1 day, then 20 mg x1 day, then 10 mg x1 day, and then 5 mg x1 day and stop - In past she refused     Allergies  Allergen Reactions   Erythromycin Other (See Comments)    "Heart palpitation"   Latex Rash

## 2022-10-14 NOTE — Telephone Encounter (Signed)
Pt called stating that she feels like she has a sinus infection and is wanting to have an abx called in.  States that she is getting up nasal drainage that is gray in color and also states that when she coughs, it is hard to cough the phlegm up.  Pt said that she does have pain in her sinuses and also has a sore throat from drainage as well. Also states she has some wheezing.  Pt denies any complaints of fever.  Pt has tried sudafed which did not help with her symptoms.  [facility]armacy that meds [facility] [place].  Dr. Marchelle Gearing, please advise if you are okay calling meds in for pt.

## 2022-10-14 NOTE — Telephone Encounter (Signed)
Called and left voicemail for patient to call office back  

## 2022-10-14 NOTE — Telephone Encounter (Signed)
PT ret April's call. I read Dr.'s reply. PT WILL take the Pred if Rx'd to [place]. As well as the Doxy.  [facility]   [address] [place]  Phone: [phone]-

## 2022-10-14 NOTE — Telephone Encounter (Signed)
Prednisone and doxycycline have been sent to pharmacy for pt.   Attempted to call pt but unable to reach. Left her a detailed message letting her know that the meds were sent in. Nothing further needed.

## 2022-10-15 ENCOUNTER — Telehealth: Payer: Self-pay | Admitting: Internal Medicine

## 2022-10-15 NOTE — Telephone Encounter (Signed)
Please call PT to advise. She has to take 3 [place] Buses to the Pharmacy. [phone]

## 2022-10-15 NOTE — Telephone Encounter (Signed)
Called [facility] in [place], [place] to see if they received the Rx for the doxycycline abx that was sent in for pt. Spoke with pharmacy staff and they said that the meds were received but the doxy was not covered by insurance.  After getting it rerun, the med did go through and they could have Rx ready for pt in about an hour. Called and spoke with pt letting her know this and she verbalized understanding. Nothing further needed.

## 2022-10-15 NOTE — Telephone Encounter (Signed)
Please see last signed encounter. PT states the Antibx did not make it to the Pharmacy in [place]. I can see it was sent but for [facility] reason [facility] not [facility].

## 2022-11-07 DIAGNOSIS — J111 Influenza due to unidentified influenza virus with other respiratory manifestations: Secondary | ICD-10-CM | POA: Diagnosis not present

## 2022-11-07 DIAGNOSIS — Z20828 Contact with and (suspected) exposure to other viral communicable diseases: Secondary | ICD-10-CM | POA: Diagnosis not present

## 2022-11-07 DIAGNOSIS — Z03818 Encounter for observation for suspected exposure to other biological agents ruled out: Secondary | ICD-10-CM | POA: Diagnosis not present

## 2022-12-29 DIAGNOSIS — E785 Hyperlipidemia, unspecified: Secondary | ICD-10-CM | POA: Diagnosis not present

## 2022-12-29 DIAGNOSIS — J449 Chronic obstructive pulmonary disease, unspecified: Secondary | ICD-10-CM | POA: Diagnosis not present

## 2023-01-07 DIAGNOSIS — M1990 Unspecified osteoarthritis, unspecified site: Secondary | ICD-10-CM | POA: Diagnosis not present

## 2023-01-07 DIAGNOSIS — M545 Low back pain, unspecified: Secondary | ICD-10-CM | POA: Diagnosis not present

## 2023-01-07 DIAGNOSIS — Z96643 Presence of artificial hip joint, bilateral: Secondary | ICD-10-CM | POA: Diagnosis not present

## 2023-01-07 DIAGNOSIS — M7732 Calcaneal spur, left foot: Secondary | ICD-10-CM | POA: Diagnosis not present

## 2023-01-07 DIAGNOSIS — M79672 Pain in left foot: Secondary | ICD-10-CM | POA: Diagnosis not present

## 2023-01-07 DIAGNOSIS — M25572 Pain in left ankle and joints of left foot: Secondary | ICD-10-CM | POA: Diagnosis not present

## 2023-01-07 DIAGNOSIS — W010XXA Fall on same level from slipping, tripping and stumbling without subsequent striking against object, initial encounter: Secondary | ICD-10-CM | POA: Diagnosis not present

## 2023-01-07 DIAGNOSIS — J45909 Unspecified asthma, uncomplicated: Secondary | ICD-10-CM | POA: Diagnosis not present

## 2023-01-07 DIAGNOSIS — M47816 Spondylosis without myelopathy or radiculopathy, lumbar region: Secondary | ICD-10-CM | POA: Diagnosis not present

## 2023-01-07 DIAGNOSIS — M19072 Primary osteoarthritis, left ankle and foot: Secondary | ICD-10-CM | POA: Diagnosis not present

## 2023-01-07 DIAGNOSIS — G8929 Other chronic pain: Secondary | ICD-10-CM | POA: Diagnosis not present

## 2023-01-07 DIAGNOSIS — S99912A Unspecified injury of left ankle, initial encounter: Secondary | ICD-10-CM | POA: Diagnosis not present

## 2023-01-07 DIAGNOSIS — Y998 Other external cause status: Secondary | ICD-10-CM | POA: Diagnosis not present

## 2023-01-07 DIAGNOSIS — M25552 Pain in left hip: Secondary | ICD-10-CM | POA: Diagnosis not present

## 2023-01-13 DIAGNOSIS — M47816 Spondylosis without myelopathy or radiculopathy, lumbar region: Secondary | ICD-10-CM | POA: Diagnosis not present

## 2023-01-13 DIAGNOSIS — M533 Sacrococcygeal disorders, not elsewhere classified: Secondary | ICD-10-CM | POA: Diagnosis not present

## 2023-01-14 DIAGNOSIS — G8929 Other chronic pain: Secondary | ICD-10-CM | POA: Diagnosis not present

## 2023-01-14 DIAGNOSIS — Z96643 Presence of artificial hip joint, bilateral: Secondary | ICD-10-CM | POA: Diagnosis not present

## 2023-01-14 DIAGNOSIS — M25551 Pain in right hip: Secondary | ICD-10-CM | POA: Diagnosis not present

## 2023-01-15 DIAGNOSIS — I10 Essential (primary) hypertension: Secondary | ICD-10-CM | POA: Diagnosis not present

## 2023-01-15 DIAGNOSIS — R296 Repeated falls: Secondary | ICD-10-CM | POA: Diagnosis not present

## 2023-01-15 DIAGNOSIS — R7303 Prediabetes: Secondary | ICD-10-CM | POA: Diagnosis not present

## 2023-01-15 DIAGNOSIS — R232 Flushing: Secondary | ICD-10-CM | POA: Diagnosis not present

## 2023-01-15 DIAGNOSIS — Z6827 Body mass index (BMI) 27.0-27.9, adult: Secondary | ICD-10-CM | POA: Diagnosis not present

## 2023-01-15 DIAGNOSIS — E559 Vitamin D deficiency, unspecified: Secondary | ICD-10-CM | POA: Diagnosis not present

## 2023-01-15 DIAGNOSIS — E782 Mixed hyperlipidemia: Secondary | ICD-10-CM | POA: Diagnosis not present

## 2023-01-15 DIAGNOSIS — N3010 Interstitial cystitis (chronic) without hematuria: Secondary | ICD-10-CM | POA: Diagnosis not present

## 2023-01-23 ENCOUNTER — Telehealth (INDEPENDENT_AMBULATORY_CARE_PROVIDER_SITE_OTHER): Payer: Medicare HMO | Admitting: Internal Medicine

## 2023-01-23 ENCOUNTER — Encounter: Payer: Self-pay | Admitting: Internal Medicine

## 2023-01-23 VITALS — Ht 65.0 in | Wt 155.0 lb

## 2023-01-23 DIAGNOSIS — J4541 Moderate persistent asthma with (acute) exacerbation: Secondary | ICD-10-CM

## 2023-01-23 MED ORDER — DOXYCYCLINE HYCLATE 100 MG PO TABS: 100.0000 mg | ORAL_TABLET | Freq: Two times a day (BID) | ORAL | 0 refills | Status: AC

## 2023-01-23 MED ORDER — PREDNISONE 10 MG PO TABS: ORAL_TABLET | ORAL | 0 refills | Status: AC

## 2023-01-23 NOTE — Addendum Note (Signed)
Addended by: Hedda Slade on: 01/23/2023 11:20 AM   Modules accepted: Orders

## 2023-01-23 NOTE — Patient Instructions (Addendum)
ICD-10-CM   1. Moderate persistent asthma with acute exacerbation  J45.41       Take prednisone 40 mg daily x 2 days, then 20mg  daily x 2 days, then 10mg  daily x 2 days, then 5mg  daily x 2 days and stop  Take doxycycline 100mg  po twice daily x 5 days; take after meals and avoid sunlight  [facility] [place] [facility] [address]  Continue other meds  Followup - full PFT in 3 months; 15 min visit

## 2023-01-23 NOTE — Addendum Note (Signed)
Addended by: Hedda Slade on: 01/23/2023 11:19 AM   Modules accepted: Orders

## 2023-01-23 NOTE — Progress Notes (Signed)
OV 02/25/2014  Chief Complaint  Patient presents with   Follow-up    Pt states she was taken off spiriva by allergy doctor. Pt c/o orthopnea and  mid sternal chest pain with activity and rest. Pt denies cough.     Followup asthma with baseline normal spirometry but a moderate persistent regimen  - Last visit was in February 2015. At that time she was doing well. Overall she continues to do well in terms of asthma except for the summer for the last week or so and with a transition in season she is having increased chest tightness, cough and wheezing. She has significant acid reflux and is having associated orthopnea. Spirometry today is essentially normal with an FEV1 of 1.80 the/81% and the ratio 71. But these numbers are reduced compared to her baseline which normally run at 100%. I offered her a short course of prednisone but she is reluctant due to prior side effects  Past medical history review: She is on chronic antibiotics for recurrent urinary tract infection  04/16/2022: OV with Cobb NP acute visit.     67 year old female, former smoker followed for asthma and chronic cough. She is a patient of Dr. Jane Canary and last seen in office 05/01/2022 by Allison Quarry NP. Past medical history significant for allergic rhinitis, GERD, athritis, HLD, endometriosis s/p hysterectomy, anxiety, depression, chronic back pain, insomnia.     Around a month ago, she developed a persistent cough and some increased shortness of breath.  She also had sinus congestion and felt like she might be getting sick.  She was seen in urgent care in [place] and treated with Z-Pak and prednisone.  She initially felt somewhat better.  Unfortunately, over the last few weeks she started to feel worse again.  Cough is persistent, primarily dry.  Paroxysmal at times.  She also has chest tightness, wheezing and increased shortness of breath from baseline.  She has tenderness on her sinuses and is producing yellow mucus every time  she blows her nose.  She has some headaches.  Denies any fevers, chills, hemoptysis, known sick exposures, orthopnea, lower extremity swelling, sore throat.  She is currently using Symbicort 82 puffs twice a day.  Having to use her albuterol more frequently.  She takes Singulair and Allegra for allergies.  She is still splitting her time between here in [place].  She helps take care of her mother whenever she is up there so she spends more of her time there. FeNO 30 ppb  05/01/2022: Virtual visit with Cobb NP. Improved and feels much better. Still has some occasional chest tightness and wheezing; clears with inhaler. Never received high dose Symbicort; insurance wouldn't cover it. Possible yeast infection.  09/04/2022: Today - acute Patient presents today for acute visit. She had COVID in January. Since then, she has been struggling with persistent sinus symptoms and a productive cough. She has been producing gray color phlegm with her cough and occasionally has some chest discomfort with coughing. Feeling more short winded and notices more wheezing. She has a lot of nasal congestion and facial pain. Has to clear her throat frequently due to postnasal drainage. Denies fevers, chills, hemoptysis, difficulties swallowing. Eating and drinking well. Doing saline rinses and nasal sprays. Using her symbicort twice daily. Using albuterol a few times a day.  TEST/EVENTS:  04/26/2013 CT chest without contrast: No evidence of ILD.  No air trapping.  Lungs are clear.   12/16/2017 PFT: FVC 99, FEV1 79, ratio 72,  TLC 109, DLCOunc 91.  Mild to moderate obstruction with reversibility (24% improvement after BD) 04/16/2022 CXR: lungs are clear  OV 01/23/2023  Subjective:  Patient ID: Yolanda Yang, female , DOB: 12-Jul-1955 , age 67 y.o. , MRN: [id number] , ADDRESS: [address] [place] [address] PCP Mirna Mires, MD Patient Care Team: Mirna Mires, MD as PCP - General  This Provider for this visit:  Treatment Team:  Attending Provider: Kalman Shan, MD    01/23/2023 -   Chief Complaint  Patient presents with   Follow-up    F/up on inhaler is not working for COPD, cough, and sob.   Type of visit: Video Virtual Visit Identification of patient Yolanda Yang with 18-Sep-1955 and MRN [id number] - 2 person identifier Risks: Risks, benefits, limitations of telephone visit explained. Patient understood and verbalized agreement to proceed Anyone else on call: no Patient location: her moms home in [facility] This provider location: [address]; [place]; [place] [address]. [facility]. [phone]    HPI Yolanda Yang 67 y.o. -patient have not seen me personally since 2015 she has been seeing nurse practitioners.  For the last 5 years she has been going to [place] once a month where she is right now to take care of her demented mom.  She is living in the [place] right now but she will come back to [place] primary residence is [facility].  She has asthma and upper airway cough.  Currently she feels decompensated for the last 1 year more so in the last few to several weeks because of the heat and humidity [place] sputum is yellow color.  She is waking up with coughing at least 3 times at night.  She feels she is an asthma exacerbation this wheezing as well.  No shortness of breath otherwise no fever.  She is compliant with her asthma medications.   PFT     Latest Ref Rng & Units 12/16/2017   12:22 PM  ILD indicators  FVC-Pre L 2.78   FVC-Predicted Pre % 99   FVC-Post L 3.03   FVC-Predicted Post % 109   TLC L 5.69   TLC Predicted % 109   DLCO uncorrected ml/min/mmHg 23.36   DLCO UNC %Pred % 91       LAB RESULTS last 96 hours No results found.  LAB RESULTS last 90 days No results found for this or any previous visit (from the past 2160 hour(s)).       has a past medical history of Anxiety, Asthma exacerbation, Bladder  pain, Borderline type 2 diabetes mellitus, Cerebral aneurysm, nonruptured (monitored at [facility] by dr Jaynie Crumble), Chronic constipation, Cystitis, interstitial, Diabetes mellitus without complication (HCC), GERD (gastroesophageal reflux disease), Headache syndrome, History of cerebral aneurysm repair, History of chronic sinusitis, History of duodenal ulcer, History of gastric ulcer, History of GI bleed, History of recurrent UTIs, Hyperlipemia, IBS (irritable bowel syndrome), Incomplete emptying of bladder, OA (osteoarthritis), Right foot drop, Seasonal allergic rhinitis, Spinal headache (11/20/2018), Urinary hesitancy, Wears dentures, and Wears glasses.   reports that she quit smoking about 11 years ago. Her smoking use included cigarettes. She started smoking about 31 years ago. She has a 10 pack-year smoking history. She has never used smokeless tobacco.  Past Surgical History:  Procedure Laterality Date   ABDOMINAL HYSTERECTOMY  1997   ANEURYSM COILING  05-08-2011     BAPTIST   INTRACRANIAL COILING X4  and Right  Carotid stenting due to intraoperative injury   ANTERIOR CERVICAL DECOMP/DISCECTOMY FUSION  2002   C4 -- C7   BREAST REDUCTION SURGERY Bilateral 06/09/2014   Procedure: BILATERAL MAMMARY REDUCTION  (BREAST);  Surgeon: Glenna Fellows, MD;  Location: [facility];  Service: Plastics;  Laterality: Bilateral;   CYSTO/  HYDRODISTENTION/  INSTILLATION THERAPY  07-15-2006   CYSTOSCOPY WITH HYDRODISTENSION AND BIOPSY N/A 09/08/2014   Procedure: CYSTOSCOPY/ HYDRODISTENSION OF BLADDER/ INSTILLATION OF MARCAINE AND PYRIDIUM;  Surgeon: Alfredo Martinez, MD;  Location: [facility];  Service: Urology;  Laterality: N/A;   ESOPHAGOGASTRODUODENOSCOPY  05/17/2011   Procedure: ESOPHAGOGASTRODUODENOSCOPY (EGD);  Surgeon: Theda Belfast;  [facility];  Service: Endoscopy;  Laterality: N/A;   LEFT BREAST LUMPECTOMY  2000   benign   NEGATIVE SLEEP STUDY  2011  PER PT   POSTERIOR FUSION  CERVICAL SPINE  2004   Revision C4 -- C7   POSTERIOR LAMINECTOMY / DECOMPRESSION LUMBAR SPINE  05-17-2010   L3 -- L5   ROTATOR CUFF REPAIR Right 2014   TONSILLECTOMY  age 56   TOTAL HIP ARTHROPLASTY Bilateral may & july 2013    Allergies  Allergen Reactions   Erythromycin Other (See Comments)    "Heart palpitation"   Latex Rash    Immunization History  Administered Date(s) Administered   Influenza Inj Mdck Quad Pf 04/06/2019   Influenza Split 04/29/2006, 05/10/2011, 05/04/2012, 03/26/2013, 07/01/2017   Influenza Whole 04/29/2006   Influenza,inj,Quad PF,6+ Mos 05/10/2011, 05/04/2012, 05/12/2014, 02/24/2017   Influenza-Unspecified 03/26/2013, 05/12/2014   Moderna Sars-Covid-2 Vaccination 08/28/2019, 09/25/2019, 01/03/2020   Td 12/05/2004   Td (Adult),5 Lf Tetanus Toxid, Preservative Free 12/05/2004   Unspecified SARS-COV-2 Vaccination 08/28/2019, 09/25/2019   Zoster Recombinant(Shingrix) 04/21/2019    Family History  Problem Relation Age of Onset   Colon cancer Father    Leukemia Father      Current Outpatient Medications:    albuterol (PROVENTIL) (2.5 MG/3ML) 0.083% nebulizer solution, INHALE THE CONTENTS OF 1 VIAL IN NEBULIZER EVERY 12 HOURS AS NEEDED, Disp: 75 mL, Rfl: 1   albuterol (VENTOLIN HFA) 108 (90 Base) MCG/ACT inhaler, INHALE 2 PUFFS INTO THE LUNGS EVERY 4 HOURS AS NEEDED FOR WHEEZING OR SHORTNESS OF BREATH., Disp: 18 g, Rfl: 3   aspirin 81 MG chewable tablet, Chew 81 mg by mouth daily. , Disp: , Rfl:    azelastine (ASTELIN) 0.1 % nasal spray, Place 2 sprays into both nostrils 2 (two) times daily. Use in each nostril as directed, Disp: 30 mL, Rfl: 5   benzonatate (TESSALON) 200 MG capsule, Take 1 capsule (200 mg total) by mouth 3 (three) times daily as needed for cough., Disp: 30 capsule, Rfl: 1   budesonide-formoterol (SYMBICORT) 160-4.5 MCG/ACT inhaler, Inhale 2 puffs into the lungs in the morning and at bedtime., Disp: 1 each, Rfl: 5   diazepam (VALIUM) 5 MG  tablet, Take 5 mg by mouth every 6 (six) hours as needed for anxiety., Disp: , Rfl:    doxycycline (VIBRA-TABS) 100 MG tablet, Take 1 tablet (100 mg total) by mouth 2 (two) times daily., Disp: 14 tablet, Rfl: 0   DULoxetine (CYMBALTA) 60 MG capsule, Take 60 mg by mouth every evening. , Disp: , Rfl:    fexofenadine (ALLEGRA) 180 MG tablet, Take 180 mg by mouth daily as needed for allergies or rhinitis., Disp: , Rfl:    fluticasone (FLONASE) 50 MCG/ACT nasal spray, Place 1 spray into both nostrils daily., Disp: 18.2 mL, Rfl: 2   gabapentin (NEURONTIN) 600  MG tablet, Take 600 mg by mouth 3 (three) times daily., Disp: , Rfl:    guaiFENesin (MUCINEX) 600 MG 12 hr tablet, Take by mouth., Disp: , Rfl:    HYDROmorphone (DILAUDID) 4 MG tablet, Take 1-1.5 tablets (4-6 mg total) by mouth every 4 (four) hours as needed for severe pain., Disp: 50 tablet, Rfl: 0   linaclotide (LINZESS) 290 MCG CAPS capsule, Take 290 mcg by mouth every evening., Disp: , Rfl:    Meth-Hyo-M Bl-Na Phos-Ph Sal (URIBEL) 118 MG CAPS, Take 2 capsules by mouth 2 (two) times daily as needed (interstitial cystitis)., Disp: , Rfl:    Meth-Hyo-M Bl-Na Phos-Ph Sal (URIBEL) 118 MG CAPS, Take by mouth., Disp: , Rfl:    metroNIDAZOLE (METROGEL) 0.75 % vaginal gel, , Disp: , Rfl:    montelukast (SINGULAIR) 10 MG tablet, TAKE 1 TABLET AT BEDTIME (NEED TO SCHEDULE APPOINTMENT WITH DR. Marchelle Gearing), Disp: 90 tablet, Rfl: 3   morphine (KADIAN) 50 MG 24 hr capsule, , Disp: , Rfl:    nitrofurantoin, macrocrystal-monohydrate, (MACROBID) 100 MG capsule, Take 100 mg by mouth daily as needed (flare up)., Disp: , Rfl:    nystatin (MYCOSTATIN) 100000 UNIT/ML suspension, Take 5 mLs (500,000 Units total) by mouth 4 (four) times daily., Disp: 60 mL, Rfl: 0   nystatin cream (MYCOSTATIN), Apply 1 application  topically at bedtime. PT USES PRN QHS WHEN ON ANTIBIOTICS TO PREVENT YEAST INFECTION, Disp: , Rfl:    nystatin-triamcinolone (MYCOLOG II) cream, , Disp: , Rfl:     ondansetron (ZOFRAN) 4 MG tablet, Take 1 tablet (4 mg total) by mouth every 8 (eight) hours as needed for nausea or vomiting., Disp: 20 tablet, Rfl: 0   pentosan polysulfate (ELMIRON) 100 MG capsule, Take 100 mg by mouth 2 (two) times daily., Disp: , Rfl:    potassium chloride (K-DUR) 10 MEQ tablet, Take 40mg  twice a day tomorrow, and then 20mg  twice a day for two more days, Disp: 16 tablet, Rfl: 0   Probiotic Product (PROBIOTIC DAILY PO), Take 1 capsule by mouth every evening., Disp: , Rfl:    promethazine (PHENERGAN) 12.5 MG tablet, Take 12.5 mg by mouth every 6 (six) hours as needed for nausea., Disp: , Rfl:    promethazine-dextromethorphan (PROMETHAZINE-DM) 6.25-15 MG/5ML syrup, Take 5 mLs by mouth every 6 (six) hours as needed for cough., Disp: 180 mL, Rfl: 0   rosuvastatin (CRESTOR) 10 MG tablet, Take 10 mg by mouth every evening. , Disp: , Rfl:    Zoster Vaccine Adjuvanted (SHINGRIX) injection, Shingrix (PF) 50 mcg/0.5 mL intramuscular suspension, kit  PHARMACY ADMINISTERED, Disp: , Rfl:       Objective:   Vitals:   01/23/23 1051  Weight: 155 lb (70.3 kg)  Height: 5\' 5"  (1.651 m)    Estimated body mass index is 25.79 kg/m as calculated from the following:   Height as of this encounter: 5\' 5"  (1.651 m).   Weight as of this encounter: 155 lb (70.3 kg).  @WEIGHTCHANGE @  [employer]   01/23/23 1051  Weight: 155 lb (70.3 kg)     Physical Exam   General: No distress. Looks well O2 at rest: no Cane present: no Sitting in wheel chair: no Frail: no Obese: no Neuro: Alert and Oriented x 3. GCS 15. Speech normal Psych: Pleasant       Assessment:       ICD-10-CM   1. Moderate persistent asthma with acute exacerbation  J45.41          Plan:  Patient Instructions     ICD-10-CM   1. Moderate persistent asthma with acute exacerbation  J45.41       Take prednisone 40 mg daily x 2 days, then 20mg  daily x 2 days, then 10mg  daily x 2 days, then 5mg  daily x 2  days and stop  Take doxycycline 100mg  po twice daily x 5 days; take after meals and avoid sunlight  CMA to send above to [facility] in [place] ? [address]  Continue other meds  Followup - full PFT in 3 months; 15 min visit    FOLLOWUP Return in about 3 months (around 04/25/2023) for 15 min visit, with Dr Marchelle Gearing, Face to Face Visit after PFT.    SIGNATURE    Dr. Kalman Shan, M.D., F.C.C.P,  Pulmonary and Critical Care Medicine Staff Physician, [facility] Director - Interstitial Lung Disease  Program  Pulmonary Fibrosis [facility] [place], [place], [address]  Pager: [phone], If no answer or between  15:00h - 7:00h: call [phone] Telephone: [phone]  11:11 AM 01/23/2023

## 2023-02-10 DIAGNOSIS — H524 Presbyopia: Secondary | ICD-10-CM | POA: Diagnosis not present

## 2023-02-10 DIAGNOSIS — H10413 Chronic giant papillary conjunctivitis, bilateral: Secondary | ICD-10-CM | POA: Diagnosis not present

## 2023-02-10 DIAGNOSIS — H5711 Ocular pain, right eye: Secondary | ICD-10-CM | POA: Diagnosis not present

## 2023-02-10 DIAGNOSIS — H2513 Age-related nuclear cataract, bilateral: Secondary | ICD-10-CM | POA: Diagnosis not present

## 2023-02-10 DIAGNOSIS — I729 Aneurysm of unspecified site: Secondary | ICD-10-CM | POA: Diagnosis not present

## 2023-02-11 DIAGNOSIS — M533 Sacrococcygeal disorders, not elsewhere classified: Secondary | ICD-10-CM | POA: Diagnosis not present

## 2023-03-05 DIAGNOSIS — F331 Major depressive disorder, recurrent, moderate: Secondary | ICD-10-CM | POA: Diagnosis not present

## 2023-03-05 DIAGNOSIS — E782 Mixed hyperlipidemia: Secondary | ICD-10-CM | POA: Diagnosis not present

## 2023-03-05 DIAGNOSIS — G2401 Drug induced subacute dyskinesia: Secondary | ICD-10-CM | POA: Diagnosis not present

## 2023-03-05 DIAGNOSIS — R7303 Prediabetes: Secondary | ICD-10-CM | POA: Diagnosis not present

## 2023-03-06 DIAGNOSIS — M47816 Spondylosis without myelopathy or radiculopathy, lumbar region: Secondary | ICD-10-CM | POA: Diagnosis not present

## 2023-03-12 DIAGNOSIS — R251 Tremor, unspecified: Secondary | ICD-10-CM | POA: Diagnosis not present

## 2023-03-12 DIAGNOSIS — F331 Major depressive disorder, recurrent, moderate: Secondary | ICD-10-CM | POA: Diagnosis not present

## 2023-03-21 DIAGNOSIS — H6121 Impacted cerumen, right ear: Secondary | ICD-10-CM | POA: Diagnosis not present

## 2023-03-21 DIAGNOSIS — Z03818 Encounter for observation for suspected exposure to other biological agents ruled out: Secondary | ICD-10-CM | POA: Diagnosis not present

## 2023-03-21 DIAGNOSIS — J302 Other seasonal allergic rhinitis: Secondary | ICD-10-CM | POA: Diagnosis not present

## 2023-03-21 DIAGNOSIS — J069 Acute upper respiratory infection, unspecified: Secondary | ICD-10-CM | POA: Diagnosis not present

## 2023-03-24 DIAGNOSIS — J309 Allergic rhinitis, unspecified: Secondary | ICD-10-CM | POA: Diagnosis not present

## 2023-03-24 DIAGNOSIS — H6120 Impacted cerumen, unspecified ear: Secondary | ICD-10-CM | POA: Diagnosis not present

## 2023-03-31 DIAGNOSIS — J019 Acute sinusitis, unspecified: Secondary | ICD-10-CM | POA: Diagnosis not present

## 2023-04-15 DIAGNOSIS — E782 Mixed hyperlipidemia: Secondary | ICD-10-CM | POA: Diagnosis not present

## 2023-04-15 DIAGNOSIS — G2401 Drug induced subacute dyskinesia: Secondary | ICD-10-CM | POA: Diagnosis not present

## 2023-04-15 DIAGNOSIS — R296 Repeated falls: Secondary | ICD-10-CM | POA: Diagnosis not present

## 2023-04-15 DIAGNOSIS — F331 Major depressive disorder, recurrent, moderate: Secondary | ICD-10-CM | POA: Diagnosis not present

## 2023-04-15 DIAGNOSIS — M47816 Spondylosis without myelopathy or radiculopathy, lumbar region: Secondary | ICD-10-CM | POA: Diagnosis not present

## 2023-04-25 DIAGNOSIS — F319 Bipolar disorder, unspecified: Secondary | ICD-10-CM | POA: Diagnosis not present

## 2023-04-25 DIAGNOSIS — F5101 Primary insomnia: Secondary | ICD-10-CM | POA: Diagnosis not present

## 2023-05-06 DIAGNOSIS — F319 Bipolar disorder, unspecified: Secondary | ICD-10-CM | POA: Diagnosis not present

## 2023-05-06 DIAGNOSIS — F5101 Primary insomnia: Secondary | ICD-10-CM | POA: Diagnosis not present

## 2023-05-20 DIAGNOSIS — J0101 Acute recurrent maxillary sinusitis: Secondary | ICD-10-CM | POA: Diagnosis not present

## 2023-05-20 DIAGNOSIS — Z0001 Encounter for general adult medical examination with abnormal findings: Secondary | ICD-10-CM | POA: Diagnosis not present

## 2023-05-20 DIAGNOSIS — R062 Wheezing: Secondary | ICD-10-CM | POA: Diagnosis not present

## 2023-05-20 DIAGNOSIS — H60312 Diffuse otitis externa, left ear: Secondary | ICD-10-CM | POA: Diagnosis not present

## 2023-05-20 DIAGNOSIS — M47816 Spondylosis without myelopathy or radiculopathy, lumbar region: Secondary | ICD-10-CM | POA: Diagnosis not present

## 2023-05-21 ENCOUNTER — Ambulatory Visit: Payer: Medicare HMO | Admitting: Nurse Practitioner

## 2023-05-27 DIAGNOSIS — Z638 Other specified problems related to primary support group: Secondary | ICD-10-CM | POA: Diagnosis not present

## 2023-05-27 DIAGNOSIS — F5101 Primary insomnia: Secondary | ICD-10-CM | POA: Diagnosis not present

## 2023-05-27 DIAGNOSIS — F319 Bipolar disorder, unspecified: Secondary | ICD-10-CM | POA: Diagnosis not present

## 2023-06-10 DIAGNOSIS — J301 Allergic rhinitis due to pollen: Secondary | ICD-10-CM | POA: Diagnosis not present

## 2023-06-10 DIAGNOSIS — J069 Acute upper respiratory infection, unspecified: Secondary | ICD-10-CM | POA: Diagnosis not present

## 2023-06-10 DIAGNOSIS — F4321 Adjustment disorder with depressed mood: Secondary | ICD-10-CM | POA: Diagnosis not present

## 2023-06-10 DIAGNOSIS — J449 Chronic obstructive pulmonary disease, unspecified: Secondary | ICD-10-CM | POA: Diagnosis not present

## 2023-06-19 DIAGNOSIS — F319 Bipolar disorder, unspecified: Secondary | ICD-10-CM | POA: Diagnosis not present

## 2023-06-19 DIAGNOSIS — F5101 Primary insomnia: Secondary | ICD-10-CM | POA: Diagnosis not present

## 2023-07-03 DIAGNOSIS — Z636 Dependent relative needing care at home: Secondary | ICD-10-CM | POA: Diagnosis not present

## 2023-07-03 DIAGNOSIS — F5101 Primary insomnia: Secondary | ICD-10-CM | POA: Diagnosis not present

## 2023-07-09 DIAGNOSIS — G43109 Migraine with aura, not intractable, without status migrainosus: Secondary | ICD-10-CM | POA: Diagnosis not present

## 2023-07-09 DIAGNOSIS — H9313 Tinnitus, bilateral: Secondary | ICD-10-CM | POA: Diagnosis not present

## 2023-07-09 DIAGNOSIS — H9209 Otalgia, unspecified ear: Secondary | ICD-10-CM | POA: Diagnosis not present

## 2023-07-17 DIAGNOSIS — F5101 Primary insomnia: Secondary | ICD-10-CM | POA: Diagnosis not present

## 2023-07-17 DIAGNOSIS — Z636 Dependent relative needing care at home: Secondary | ICD-10-CM | POA: Diagnosis not present

## 2023-07-29 DIAGNOSIS — Z636 Dependent relative needing care at home: Secondary | ICD-10-CM | POA: Diagnosis not present

## 2023-07-29 DIAGNOSIS — F5101 Primary insomnia: Secondary | ICD-10-CM | POA: Diagnosis not present

## 2023-07-31 DIAGNOSIS — M47816 Spondylosis without myelopathy or radiculopathy, lumbar region: Secondary | ICD-10-CM | POA: Diagnosis not present

## 2023-08-13 DIAGNOSIS — U071 COVID-19: Secondary | ICD-10-CM | POA: Diagnosis not present

## 2023-08-13 DIAGNOSIS — B349 Viral infection, unspecified: Secondary | ICD-10-CM | POA: Diagnosis not present

## 2023-08-13 DIAGNOSIS — Z20828 Contact with and (suspected) exposure to other viral communicable diseases: Secondary | ICD-10-CM | POA: Diagnosis not present

## 2023-08-20 DIAGNOSIS — F319 Bipolar disorder, unspecified: Secondary | ICD-10-CM | POA: Diagnosis not present

## 2023-08-20 DIAGNOSIS — F5101 Primary insomnia: Secondary | ICD-10-CM | POA: Diagnosis not present

## 2023-08-25 DIAGNOSIS — H6093 Unspecified otitis externa, bilateral: Secondary | ICD-10-CM | POA: Diagnosis not present

## 2023-08-25 DIAGNOSIS — J069 Acute upper respiratory infection, unspecified: Secondary | ICD-10-CM | POA: Diagnosis not present

## 2023-08-25 DIAGNOSIS — Z20828 Contact with and (suspected) exposure to other viral communicable diseases: Secondary | ICD-10-CM | POA: Diagnosis not present

## 2023-09-03 DIAGNOSIS — F319 Bipolar disorder, unspecified: Secondary | ICD-10-CM | POA: Diagnosis not present

## 2023-09-03 DIAGNOSIS — F5101 Primary insomnia: Secondary | ICD-10-CM | POA: Diagnosis not present

## 2023-09-17 DIAGNOSIS — F319 Bipolar disorder, unspecified: Secondary | ICD-10-CM | POA: Diagnosis not present

## 2023-09-17 DIAGNOSIS — F5101 Primary insomnia: Secondary | ICD-10-CM | POA: Diagnosis not present

## 2023-09-24 DIAGNOSIS — Z636 Dependent relative needing care at home: Secondary | ICD-10-CM | POA: Diagnosis not present

## 2023-09-24 DIAGNOSIS — F5101 Primary insomnia: Secondary | ICD-10-CM | POA: Diagnosis not present

## 2023-09-24 DIAGNOSIS — F319 Bipolar disorder, unspecified: Secondary | ICD-10-CM | POA: Diagnosis not present

## 2023-10-10 DIAGNOSIS — M62830 Muscle spasm of back: Secondary | ICD-10-CM | POA: Diagnosis not present

## 2023-10-10 DIAGNOSIS — I10 Essential (primary) hypertension: Secondary | ICD-10-CM | POA: Diagnosis not present

## 2023-10-10 DIAGNOSIS — M1711 Unilateral primary osteoarthritis, right knee: Secondary | ICD-10-CM | POA: Diagnosis not present

## 2023-10-15 DIAGNOSIS — Z636 Dependent relative needing care at home: Secondary | ICD-10-CM | POA: Diagnosis not present

## 2023-10-15 DIAGNOSIS — F5101 Primary insomnia: Secondary | ICD-10-CM | POA: Diagnosis not present

## 2023-10-15 DIAGNOSIS — F319 Bipolar disorder, unspecified: Secondary | ICD-10-CM | POA: Diagnosis not present

## 2023-10-23 DIAGNOSIS — R0609 Other forms of dyspnea: Secondary | ICD-10-CM | POA: Diagnosis not present

## 2023-10-23 DIAGNOSIS — R5383 Other fatigue: Secondary | ICD-10-CM | POA: Diagnosis not present

## 2023-10-23 DIAGNOSIS — M47812 Spondylosis without myelopathy or radiculopathy, cervical region: Secondary | ICD-10-CM | POA: Diagnosis not present

## 2023-10-23 DIAGNOSIS — M21371 Foot drop, right foot: Secondary | ICD-10-CM | POA: Diagnosis not present

## 2023-10-23 DIAGNOSIS — I671 Cerebral aneurysm, nonruptured: Secondary | ICD-10-CM | POA: Diagnosis not present

## 2023-10-23 DIAGNOSIS — F339 Major depressive disorder, recurrent, unspecified: Secondary | ICD-10-CM | POA: Diagnosis not present

## 2023-10-23 DIAGNOSIS — Z1239 Encounter for other screening for malignant neoplasm of breast: Secondary | ICD-10-CM | POA: Diagnosis not present

## 2023-10-23 DIAGNOSIS — Z0000 Encounter for general adult medical examination without abnormal findings: Secondary | ICD-10-CM | POA: Diagnosis not present

## 2023-10-23 DIAGNOSIS — Z1231 Encounter for screening mammogram for malignant neoplasm of breast: Secondary | ICD-10-CM | POA: Diagnosis not present

## 2023-10-23 DIAGNOSIS — G2401 Drug induced subacute dyskinesia: Secondary | ICD-10-CM | POA: Diagnosis not present

## 2023-10-30 DIAGNOSIS — Z636 Dependent relative needing care at home: Secondary | ICD-10-CM | POA: Diagnosis not present

## 2023-10-30 DIAGNOSIS — F5101 Primary insomnia: Secondary | ICD-10-CM | POA: Diagnosis not present

## 2023-10-30 DIAGNOSIS — F319 Bipolar disorder, unspecified: Secondary | ICD-10-CM | POA: Diagnosis not present

## 2023-10-30 DIAGNOSIS — F411 Generalized anxiety disorder: Secondary | ICD-10-CM | POA: Diagnosis not present

## 2023-10-30 DIAGNOSIS — F330 Major depressive disorder, recurrent, mild: Secondary | ICD-10-CM | POA: Diagnosis not present

## 2023-11-03 DIAGNOSIS — M1712 Unilateral primary osteoarthritis, left knee: Secondary | ICD-10-CM | POA: Diagnosis not present

## 2023-11-04 DIAGNOSIS — Z8679 Personal history of other diseases of the circulatory system: Secondary | ICD-10-CM | POA: Diagnosis not present

## 2023-11-04 DIAGNOSIS — I6782 Cerebral ischemia: Secondary | ICD-10-CM | POA: Diagnosis not present

## 2023-11-04 DIAGNOSIS — R930 Abnormal findings on diagnostic imaging of skull and head, not elsewhere classified: Secondary | ICD-10-CM | POA: Diagnosis not present

## 2023-11-06 DIAGNOSIS — R0602 Shortness of breath: Secondary | ICD-10-CM | POA: Diagnosis not present

## 2023-11-06 DIAGNOSIS — E7800 Pure hypercholesterolemia, unspecified: Secondary | ICD-10-CM | POA: Diagnosis not present

## 2023-11-10 DIAGNOSIS — M25551 Pain in right hip: Secondary | ICD-10-CM | POA: Diagnosis not present

## 2023-11-10 DIAGNOSIS — Z96641 Presence of right artificial hip joint: Secondary | ICD-10-CM | POA: Diagnosis not present

## 2023-11-10 DIAGNOSIS — T8484XA Pain due to internal orthopedic prosthetic devices, implants and grafts, initial encounter: Secondary | ICD-10-CM | POA: Diagnosis not present

## 2023-11-11 DIAGNOSIS — R21 Rash and other nonspecific skin eruption: Secondary | ICD-10-CM | POA: Diagnosis not present

## 2023-11-13 DIAGNOSIS — F5101 Primary insomnia: Secondary | ICD-10-CM | POA: Diagnosis not present

## 2023-11-13 DIAGNOSIS — Z636 Dependent relative needing care at home: Secondary | ICD-10-CM | POA: Diagnosis not present

## 2023-11-13 DIAGNOSIS — F330 Major depressive disorder, recurrent, mild: Secondary | ICD-10-CM | POA: Diagnosis not present

## 2023-11-13 DIAGNOSIS — F411 Generalized anxiety disorder: Secondary | ICD-10-CM | POA: Diagnosis not present

## 2023-11-18 DIAGNOSIS — R300 Dysuria: Secondary | ICD-10-CM | POA: Diagnosis not present

## 2023-11-19 DIAGNOSIS — M47896 Other spondylosis, lumbar region: Secondary | ICD-10-CM | POA: Diagnosis not present

## 2023-11-19 DIAGNOSIS — M4316 Spondylolisthesis, lumbar region: Secondary | ICD-10-CM | POA: Diagnosis not present

## 2023-11-21 DIAGNOSIS — Z803 Family history of malignant neoplasm of breast: Secondary | ICD-10-CM | POA: Diagnosis not present

## 2023-11-21 DIAGNOSIS — Z1231 Encounter for screening mammogram for malignant neoplasm of breast: Secondary | ICD-10-CM | POA: Diagnosis not present

## 2023-11-25 DIAGNOSIS — M545 Low back pain, unspecified: Secondary | ICD-10-CM | POA: Diagnosis not present

## 2023-11-25 DIAGNOSIS — M5416 Radiculopathy, lumbar region: Secondary | ICD-10-CM | POA: Diagnosis not present

## 2023-11-25 DIAGNOSIS — M4316 Spondylolisthesis, lumbar region: Secondary | ICD-10-CM | POA: Diagnosis not present

## 2023-11-27 DIAGNOSIS — H109 Unspecified conjunctivitis: Secondary | ICD-10-CM | POA: Diagnosis not present

## 2023-11-27 DIAGNOSIS — R809 Proteinuria, unspecified: Secondary | ICD-10-CM | POA: Diagnosis not present

## 2023-11-27 DIAGNOSIS — H5789 Other specified disorders of eye and adnexa: Secondary | ICD-10-CM | POA: Diagnosis not present

## 2023-12-02 DIAGNOSIS — M48061 Spinal stenosis, lumbar region without neurogenic claudication: Secondary | ICD-10-CM | POA: Diagnosis not present

## 2023-12-02 DIAGNOSIS — M4316 Spondylolisthesis, lumbar region: Secondary | ICD-10-CM | POA: Diagnosis not present

## 2023-12-02 DIAGNOSIS — M4727 Other spondylosis with radiculopathy, lumbosacral region: Secondary | ICD-10-CM | POA: Diagnosis not present

## 2023-12-02 DIAGNOSIS — M9973 Connective tissue and disc stenosis of intervertebral foramina of lumbar region: Secondary | ICD-10-CM | POA: Diagnosis not present

## 2023-12-02 DIAGNOSIS — M5117 Intervertebral disc disorders with radiculopathy, lumbosacral region: Secondary | ICD-10-CM | POA: Diagnosis not present

## 2023-12-10 DIAGNOSIS — F330 Major depressive disorder, recurrent, mild: Secondary | ICD-10-CM | POA: Diagnosis not present

## 2023-12-10 DIAGNOSIS — R928 Other abnormal and inconclusive findings on diagnostic imaging of breast: Secondary | ICD-10-CM | POA: Diagnosis not present

## 2023-12-10 DIAGNOSIS — R921 Mammographic calcification found on diagnostic imaging of breast: Secondary | ICD-10-CM | POA: Diagnosis not present

## 2023-12-10 DIAGNOSIS — Z636 Dependent relative needing care at home: Secondary | ICD-10-CM | POA: Diagnosis not present

## 2023-12-10 DIAGNOSIS — F411 Generalized anxiety disorder: Secondary | ICD-10-CM | POA: Diagnosis not present

## 2023-12-10 DIAGNOSIS — Z803 Family history of malignant neoplasm of breast: Secondary | ICD-10-CM | POA: Diagnosis not present

## 2023-12-10 DIAGNOSIS — F5101 Primary insomnia: Secondary | ICD-10-CM | POA: Diagnosis not present

## 2023-12-12 DIAGNOSIS — R350 Frequency of micturition: Secondary | ICD-10-CM | POA: Diagnosis not present

## 2023-12-15 DIAGNOSIS — M1712 Unilateral primary osteoarthritis, left knee: Secondary | ICD-10-CM | POA: Diagnosis not present

## 2023-12-22 DIAGNOSIS — R8289 Other abnormal findings on cytological and histological examination of urine: Secondary | ICD-10-CM | POA: Diagnosis not present

## 2023-12-25 DIAGNOSIS — R921 Mammographic calcification found on diagnostic imaging of breast: Secondary | ICD-10-CM | POA: Diagnosis not present

## 2023-12-25 DIAGNOSIS — Z170 Estrogen receptor positive status [ER+]: Secondary | ICD-10-CM | POA: Diagnosis not present

## 2023-12-25 DIAGNOSIS — C50411 Malignant neoplasm of upper-outer quadrant of right female breast: Secondary | ICD-10-CM | POA: Diagnosis not present

## 2023-12-25 DIAGNOSIS — D0511 Intraductal carcinoma in situ of right breast: Secondary | ICD-10-CM | POA: Diagnosis not present

## 2023-12-30 DIAGNOSIS — F330 Major depressive disorder, recurrent, mild: Secondary | ICD-10-CM | POA: Diagnosis not present

## 2023-12-30 DIAGNOSIS — F411 Generalized anxiety disorder: Secondary | ICD-10-CM | POA: Diagnosis not present

## 2023-12-30 DIAGNOSIS — Z636 Dependent relative needing care at home: Secondary | ICD-10-CM | POA: Diagnosis not present

## 2023-12-30 DIAGNOSIS — F5101 Primary insomnia: Secondary | ICD-10-CM | POA: Diagnosis not present

## 2024-01-05 DIAGNOSIS — R791 Abnormal coagulation profile: Secondary | ICD-10-CM | POA: Diagnosis not present

## 2024-01-05 DIAGNOSIS — D0511 Intraductal carcinoma in situ of right breast: Secondary | ICD-10-CM | POA: Diagnosis not present

## 2024-01-07 DIAGNOSIS — Z636 Dependent relative needing care at home: Secondary | ICD-10-CM | POA: Diagnosis not present

## 2024-01-07 DIAGNOSIS — F5101 Primary insomnia: Secondary | ICD-10-CM | POA: Diagnosis not present

## 2024-01-07 DIAGNOSIS — F411 Generalized anxiety disorder: Secondary | ICD-10-CM | POA: Diagnosis not present

## 2024-01-07 DIAGNOSIS — F330 Major depressive disorder, recurrent, mild: Secondary | ICD-10-CM | POA: Diagnosis not present

## 2024-01-12 DIAGNOSIS — M7122 Synovial cyst of popliteal space [Baker], left knee: Secondary | ICD-10-CM | POA: Diagnosis not present

## 2024-01-12 DIAGNOSIS — M23352 Other meniscus derangements, posterior horn of lateral meniscus, left knee: Secondary | ICD-10-CM | POA: Diagnosis not present

## 2024-01-12 DIAGNOSIS — M25462 Effusion, left knee: Secondary | ICD-10-CM | POA: Diagnosis not present

## 2024-01-12 DIAGNOSIS — M65862 Other synovitis and tenosynovitis, left lower leg: Secondary | ICD-10-CM | POA: Diagnosis not present

## 2024-01-12 DIAGNOSIS — M1712 Unilateral primary osteoarthritis, left knee: Secondary | ICD-10-CM | POA: Diagnosis not present

## 2024-01-12 DIAGNOSIS — M23342 Other meniscus derangements, anterior horn of lateral meniscus, left knee: Secondary | ICD-10-CM | POA: Diagnosis not present

## 2024-01-12 DIAGNOSIS — M25762 Osteophyte, left knee: Secondary | ICD-10-CM | POA: Diagnosis not present

## 2024-01-14 DIAGNOSIS — S83272A Complex tear of lateral meniscus, current injury, left knee, initial encounter: Secondary | ICD-10-CM | POA: Diagnosis not present

## 2024-01-14 DIAGNOSIS — M2342 Loose body in knee, left knee: Secondary | ICD-10-CM | POA: Diagnosis not present

## 2024-01-14 DIAGNOSIS — M1712 Unilateral primary osteoarthritis, left knee: Secondary | ICD-10-CM | POA: Diagnosis not present

## 2024-01-21 DIAGNOSIS — R0602 Shortness of breath: Secondary | ICD-10-CM | POA: Diagnosis not present

## 2024-01-21 DIAGNOSIS — G2401 Drug induced subacute dyskinesia: Secondary | ICD-10-CM | POA: Diagnosis not present

## 2024-01-21 DIAGNOSIS — I671 Cerebral aneurysm, nonruptured: Secondary | ICD-10-CM | POA: Diagnosis not present

## 2024-01-21 DIAGNOSIS — D0511 Intraductal carcinoma in situ of right breast: Secondary | ICD-10-CM | POA: Diagnosis not present

## 2024-01-21 DIAGNOSIS — Z01818 Encounter for other preprocedural examination: Secondary | ICD-10-CM | POA: Diagnosis not present

## 2024-01-21 DIAGNOSIS — R55 Syncope and collapse: Secondary | ICD-10-CM | POA: Diagnosis not present

## 2024-01-22 DIAGNOSIS — F5101 Primary insomnia: Secondary | ICD-10-CM | POA: Diagnosis not present

## 2024-01-22 DIAGNOSIS — Z636 Dependent relative needing care at home: Secondary | ICD-10-CM | POA: Diagnosis not present

## 2024-01-22 DIAGNOSIS — F330 Major depressive disorder, recurrent, mild: Secondary | ICD-10-CM | POA: Diagnosis not present

## 2024-01-22 DIAGNOSIS — F411 Generalized anxiety disorder: Secondary | ICD-10-CM | POA: Diagnosis not present

## 2024-02-03 DIAGNOSIS — J3489 Other specified disorders of nose and nasal sinuses: Secondary | ICD-10-CM | POA: Diagnosis not present

## 2024-02-03 DIAGNOSIS — I671 Cerebral aneurysm, nonruptured: Secondary | ICD-10-CM | POA: Diagnosis not present

## 2024-02-03 DIAGNOSIS — I771 Stricture of artery: Secondary | ICD-10-CM | POA: Diagnosis not present

## 2024-02-04 DIAGNOSIS — Z636 Dependent relative needing care at home: Secondary | ICD-10-CM | POA: Diagnosis not present

## 2024-02-04 DIAGNOSIS — F411 Generalized anxiety disorder: Secondary | ICD-10-CM | POA: Diagnosis not present

## 2024-02-04 DIAGNOSIS — F330 Major depressive disorder, recurrent, mild: Secondary | ICD-10-CM | POA: Diagnosis not present

## 2024-02-04 DIAGNOSIS — F5101 Primary insomnia: Secondary | ICD-10-CM | POA: Diagnosis not present

## 2024-02-06 DIAGNOSIS — L299 Pruritus, unspecified: Secondary | ICD-10-CM | POA: Diagnosis not present

## 2024-02-09 DIAGNOSIS — I671 Cerebral aneurysm, nonruptured: Secondary | ICD-10-CM | POA: Diagnosis not present

## 2024-02-09 DIAGNOSIS — G2401 Drug induced subacute dyskinesia: Secondary | ICD-10-CM | POA: Diagnosis not present

## 2024-02-10 DIAGNOSIS — R921 Mammographic calcification found on diagnostic imaging of breast: Secondary | ICD-10-CM | POA: Diagnosis not present

## 2024-02-10 DIAGNOSIS — Z87440 Personal history of urinary (tract) infections: Secondary | ICD-10-CM | POA: Diagnosis not present

## 2024-02-10 DIAGNOSIS — Z170 Estrogen receptor positive status [ER+]: Secondary | ICD-10-CM | POA: Diagnosis not present

## 2024-02-10 DIAGNOSIS — Z1721 Progesterone receptor positive status: Secondary | ICD-10-CM | POA: Diagnosis not present

## 2024-02-10 DIAGNOSIS — Z01818 Encounter for other preprocedural examination: Secondary | ICD-10-CM | POA: Diagnosis not present

## 2024-02-10 DIAGNOSIS — D0511 Intraductal carcinoma in situ of right breast: Secondary | ICD-10-CM | POA: Diagnosis not present

## 2024-02-17 DIAGNOSIS — R809 Proteinuria, unspecified: Secondary | ICD-10-CM | POA: Diagnosis not present

## 2024-02-25 DIAGNOSIS — F330 Major depressive disorder, recurrent, mild: Secondary | ICD-10-CM | POA: Diagnosis not present

## 2024-02-25 DIAGNOSIS — Z636 Dependent relative needing care at home: Secondary | ICD-10-CM | POA: Diagnosis not present

## 2024-02-25 DIAGNOSIS — F5101 Primary insomnia: Secondary | ICD-10-CM | POA: Diagnosis not present

## 2024-02-25 DIAGNOSIS — F411 Generalized anxiety disorder: Secondary | ICD-10-CM | POA: Diagnosis not present

## 2024-03-05 DIAGNOSIS — Z9889 Other specified postprocedural states: Secondary | ICD-10-CM | POA: Diagnosis not present

## 2024-03-05 DIAGNOSIS — Z853 Personal history of malignant neoplasm of breast: Secondary | ICD-10-CM | POA: Diagnosis not present

## 2024-03-05 DIAGNOSIS — Z4889 Encounter for other specified surgical aftercare: Secondary | ICD-10-CM | POA: Diagnosis not present

## 2024-03-05 DIAGNOSIS — Z09 Encounter for follow-up examination after completed treatment for conditions other than malignant neoplasm: Secondary | ICD-10-CM | POA: Diagnosis not present

## 2024-03-08 DIAGNOSIS — M1712 Unilateral primary osteoarthritis, left knee: Secondary | ICD-10-CM | POA: Diagnosis not present

## 2024-03-13 DIAGNOSIS — M25551 Pain in right hip: Secondary | ICD-10-CM | POA: Diagnosis not present

## 2024-03-15 DIAGNOSIS — M4316 Spondylolisthesis, lumbar region: Secondary | ICD-10-CM | POA: Diagnosis not present

## 2024-03-15 DIAGNOSIS — F5101 Primary insomnia: Secondary | ICD-10-CM | POA: Diagnosis not present

## 2024-03-15 DIAGNOSIS — Z96641 Presence of right artificial hip joint: Secondary | ICD-10-CM | POA: Diagnosis not present

## 2024-03-15 DIAGNOSIS — R350 Frequency of micturition: Secondary | ICD-10-CM | POA: Diagnosis not present

## 2024-03-15 DIAGNOSIS — M51362 Other intervertebral disc degeneration, lumbar region with discogenic back pain and lower extremity pain: Secondary | ICD-10-CM | POA: Diagnosis not present

## 2024-03-15 DIAGNOSIS — Z636 Dependent relative needing care at home: Secondary | ICD-10-CM | POA: Diagnosis not present

## 2024-03-15 DIAGNOSIS — F411 Generalized anxiety disorder: Secondary | ICD-10-CM | POA: Diagnosis not present

## 2024-03-15 DIAGNOSIS — G8929 Other chronic pain: Secondary | ICD-10-CM | POA: Diagnosis not present

## 2024-03-15 DIAGNOSIS — R3989 Other symptoms and signs involving the genitourinary system: Secondary | ICD-10-CM | POA: Diagnosis not present

## 2024-03-15 DIAGNOSIS — M47896 Other spondylosis, lumbar region: Secondary | ICD-10-CM | POA: Diagnosis not present

## 2024-03-15 DIAGNOSIS — F330 Major depressive disorder, recurrent, mild: Secondary | ICD-10-CM | POA: Diagnosis not present

## 2024-03-15 DIAGNOSIS — M545 Low back pain, unspecified: Secondary | ICD-10-CM | POA: Diagnosis not present

## 2024-03-15 DIAGNOSIS — M25551 Pain in right hip: Secondary | ICD-10-CM | POA: Diagnosis not present

## 2024-03-17 DIAGNOSIS — D0511 Intraductal carcinoma in situ of right breast: Secondary | ICD-10-CM | POA: Diagnosis not present

## 2024-03-31 DIAGNOSIS — F5101 Primary insomnia: Secondary | ICD-10-CM | POA: Diagnosis not present

## 2024-03-31 DIAGNOSIS — Z636 Dependent relative needing care at home: Secondary | ICD-10-CM | POA: Diagnosis not present

## 2024-03-31 DIAGNOSIS — F330 Major depressive disorder, recurrent, mild: Secondary | ICD-10-CM | POA: Diagnosis not present

## 2024-03-31 DIAGNOSIS — F411 Generalized anxiety disorder: Secondary | ICD-10-CM | POA: Diagnosis not present

## 2024-04-02 DIAGNOSIS — G8929 Other chronic pain: Secondary | ICD-10-CM | POA: Diagnosis not present

## 2024-04-02 DIAGNOSIS — M25552 Pain in left hip: Secondary | ICD-10-CM | POA: Diagnosis not present

## 2024-04-02 DIAGNOSIS — D0511 Intraductal carcinoma in situ of right breast: Secondary | ICD-10-CM | POA: Diagnosis not present

## 2024-04-02 DIAGNOSIS — Z96642 Presence of left artificial hip joint: Secondary | ICD-10-CM | POA: Diagnosis not present

## 2024-04-05 DIAGNOSIS — R350 Frequency of micturition: Secondary | ICD-10-CM | POA: Diagnosis not present

## 2024-04-07 DIAGNOSIS — R3121 Asymptomatic microscopic hematuria: Secondary | ICD-10-CM | POA: Diagnosis not present

## 2024-04-08 DIAGNOSIS — M2578 Osteophyte, vertebrae: Secondary | ICD-10-CM | POA: Diagnosis not present

## 2024-04-08 DIAGNOSIS — M48062 Spinal stenosis, lumbar region with neurogenic claudication: Secondary | ICD-10-CM | POA: Diagnosis not present

## 2024-04-08 DIAGNOSIS — Z981 Arthrodesis status: Secondary | ICD-10-CM | POA: Diagnosis not present

## 2024-04-08 DIAGNOSIS — M4722 Other spondylosis with radiculopathy, cervical region: Secondary | ICD-10-CM | POA: Diagnosis not present

## 2024-04-09 DIAGNOSIS — M5416 Radiculopathy, lumbar region: Secondary | ICD-10-CM | POA: Diagnosis not present

## 2024-04-09 DIAGNOSIS — R531 Weakness: Secondary | ICD-10-CM | POA: Diagnosis not present

## 2024-04-09 DIAGNOSIS — M21371 Foot drop, right foot: Secondary | ICD-10-CM | POA: Diagnosis not present

## 2024-04-09 DIAGNOSIS — R202 Paresthesia of skin: Secondary | ICD-10-CM | POA: Diagnosis not present

## 2024-04-16 DIAGNOSIS — D0511 Intraductal carcinoma in situ of right breast: Secondary | ICD-10-CM | POA: Diagnosis not present

## 2024-04-19 DIAGNOSIS — D0511 Intraductal carcinoma in situ of right breast: Secondary | ICD-10-CM | POA: Diagnosis not present

## 2024-04-27 DIAGNOSIS — M5416 Radiculopathy, lumbar region: Secondary | ICD-10-CM | POA: Diagnosis not present

## 2024-04-28 DIAGNOSIS — F411 Generalized anxiety disorder: Secondary | ICD-10-CM | POA: Diagnosis not present

## 2024-04-28 DIAGNOSIS — F5101 Primary insomnia: Secondary | ICD-10-CM | POA: Diagnosis not present

## 2024-04-28 DIAGNOSIS — F330 Major depressive disorder, recurrent, mild: Secondary | ICD-10-CM | POA: Diagnosis not present

## 2024-04-29 DIAGNOSIS — D0511 Intraductal carcinoma in situ of right breast: Secondary | ICD-10-CM | POA: Diagnosis not present

## 2024-04-30 DIAGNOSIS — R21 Rash and other nonspecific skin eruption: Secondary | ICD-10-CM | POA: Diagnosis not present

## 2024-04-30 DIAGNOSIS — L853 Xerosis cutis: Secondary | ICD-10-CM | POA: Diagnosis not present

## 2024-04-30 DIAGNOSIS — L219 Seborrheic dermatitis, unspecified: Secondary | ICD-10-CM | POA: Diagnosis not present

## 2024-05-03 DIAGNOSIS — D0511 Intraductal carcinoma in situ of right breast: Secondary | ICD-10-CM | POA: Diagnosis not present

## 2024-05-04 DIAGNOSIS — D0511 Intraductal carcinoma in situ of right breast: Secondary | ICD-10-CM | POA: Diagnosis not present

## 2024-05-05 DIAGNOSIS — D0511 Intraductal carcinoma in situ of right breast: Secondary | ICD-10-CM | POA: Diagnosis not present

## 2024-05-06 DIAGNOSIS — D0511 Intraductal carcinoma in situ of right breast: Secondary | ICD-10-CM | POA: Diagnosis not present

## 2024-05-07 DIAGNOSIS — D0511 Intraductal carcinoma in situ of right breast: Secondary | ICD-10-CM | POA: Diagnosis not present

## 2024-05-13 DIAGNOSIS — M5416 Radiculopathy, lumbar region: Secondary | ICD-10-CM | POA: Diagnosis not present

## 2024-05-20 DIAGNOSIS — F330 Major depressive disorder, recurrent, mild: Secondary | ICD-10-CM | POA: Diagnosis not present

## 2024-05-20 DIAGNOSIS — F411 Generalized anxiety disorder: Secondary | ICD-10-CM | POA: Diagnosis not present

## 2024-05-20 DIAGNOSIS — F5101 Primary insomnia: Secondary | ICD-10-CM | POA: Diagnosis not present

## 2024-05-20 DIAGNOSIS — Z636 Dependent relative needing care at home: Secondary | ICD-10-CM | POA: Diagnosis not present

## 2024-05-21 DIAGNOSIS — R051 Acute cough: Secondary | ICD-10-CM | POA: Diagnosis not present

## 2024-05-21 DIAGNOSIS — J438 Other emphysema: Secondary | ICD-10-CM | POA: Diagnosis not present

## 2024-05-21 DIAGNOSIS — Z87891 Personal history of nicotine dependence: Secondary | ICD-10-CM | POA: Diagnosis not present

## 2024-05-24 DIAGNOSIS — J45909 Unspecified asthma, uncomplicated: Secondary | ICD-10-CM | POA: Diagnosis not present

## 2024-05-24 DIAGNOSIS — F0283 Dementia in other diseases classified elsewhere, unspecified severity, with mood disturbance: Secondary | ICD-10-CM | POA: Diagnosis not present

## 2024-05-24 DIAGNOSIS — Z833 Family history of diabetes mellitus: Secondary | ICD-10-CM | POA: Diagnosis not present

## 2024-05-24 DIAGNOSIS — G309 Alzheimer's disease, unspecified: Secondary | ICD-10-CM | POA: Diagnosis not present

## 2024-05-24 DIAGNOSIS — F411 Generalized anxiety disorder: Secondary | ICD-10-CM | POA: Diagnosis not present

## 2024-05-24 DIAGNOSIS — F325 Major depressive disorder, single episode, in full remission: Secondary | ICD-10-CM | POA: Diagnosis not present

## 2024-05-24 DIAGNOSIS — N3941 Urge incontinence: Secondary | ICD-10-CM | POA: Diagnosis not present

## 2024-05-24 DIAGNOSIS — G629 Polyneuropathy, unspecified: Secondary | ICD-10-CM | POA: Diagnosis not present

## 2024-05-24 DIAGNOSIS — F0284 Dementia in other diseases classified elsewhere, unspecified severity, with anxiety: Secondary | ICD-10-CM | POA: Diagnosis not present

## 2024-05-24 DIAGNOSIS — Z8249 Family history of ischemic heart disease and other diseases of the circulatory system: Secondary | ICD-10-CM | POA: Diagnosis not present

## 2024-05-24 DIAGNOSIS — M1990 Unspecified osteoarthritis, unspecified site: Secondary | ICD-10-CM | POA: Diagnosis not present

## 2024-05-24 DIAGNOSIS — I10 Essential (primary) hypertension: Secondary | ICD-10-CM | POA: Diagnosis not present

## 2024-05-24 DIAGNOSIS — E785 Hyperlipidemia, unspecified: Secondary | ICD-10-CM | POA: Diagnosis not present

## 2024-06-07 DIAGNOSIS — M2342 Loose body in knee, left knee: Secondary | ICD-10-CM | POA: Diagnosis not present

## 2024-06-07 DIAGNOSIS — S83272A Complex tear of lateral meniscus, current injury, left knee, initial encounter: Secondary | ICD-10-CM | POA: Diagnosis not present

## 2024-06-07 DIAGNOSIS — M1712 Unilateral primary osteoarthritis, left knee: Secondary | ICD-10-CM | POA: Diagnosis not present

## 2024-06-08 DIAGNOSIS — Z87891 Personal history of nicotine dependence: Secondary | ICD-10-CM | POA: Diagnosis not present

## 2024-06-08 DIAGNOSIS — F411 Generalized anxiety disorder: Secondary | ICD-10-CM | POA: Diagnosis not present

## 2024-06-08 DIAGNOSIS — F5101 Primary insomnia: Secondary | ICD-10-CM | POA: Diagnosis not present

## 2024-06-08 DIAGNOSIS — F330 Major depressive disorder, recurrent, mild: Secondary | ICD-10-CM | POA: Diagnosis not present

## 2024-06-08 DIAGNOSIS — R0609 Other forms of dyspnea: Secondary | ICD-10-CM | POA: Diagnosis not present
